# Patient Record
Sex: Female | Born: 1985 | Race: Black or African American | Hispanic: No | Marital: Married | Smoking: Former smoker
Health system: Southern US, Community
[De-identification: ages and names within clinical notes are randomized; demographics above are authoritative.]

## PROBLEM LIST (undated history)

## (undated) ENCOUNTER — Inpatient Hospital Stay (HOSPITAL_COMMUNITY): Payer: Self-pay

## (undated) DIAGNOSIS — T8859XA Other complications of anesthesia, initial encounter: Secondary | ICD-10-CM

## (undated) DIAGNOSIS — K5900 Constipation, unspecified: Secondary | ICD-10-CM

## (undated) DIAGNOSIS — E119 Type 2 diabetes mellitus without complications: Secondary | ICD-10-CM

## (undated) DIAGNOSIS — IMO0002 Reserved for concepts with insufficient information to code with codable children: Secondary | ICD-10-CM

## (undated) DIAGNOSIS — F329 Major depressive disorder, single episode, unspecified: Secondary | ICD-10-CM

## (undated) DIAGNOSIS — K279 Peptic ulcer, site unspecified, unspecified as acute or chronic, without hemorrhage or perforation: Secondary | ICD-10-CM

## (undated) DIAGNOSIS — R010 Benign and innocent cardiac murmurs: Secondary | ICD-10-CM

## (undated) DIAGNOSIS — F32A Depression, unspecified: Secondary | ICD-10-CM

## (undated) DIAGNOSIS — K219 Gastro-esophageal reflux disease without esophagitis: Secondary | ICD-10-CM

## (undated) HISTORY — PX: UPPER GASTROINTESTINAL ENDOSCOPY: SHX188

## (undated) HISTORY — DX: Gastro-esophageal reflux disease without esophagitis: K21.9

## (undated) HISTORY — DX: Major depressive disorder, single episode, unspecified: F32.9

## (undated) HISTORY — DX: Constipation, unspecified: K59.00

## (undated) HISTORY — DX: Type 2 diabetes mellitus without complications: E11.9

## (undated) HISTORY — PX: WISDOM TOOTH EXTRACTION: SHX21

## (undated) HISTORY — PX: MYRINGOTOMY: SHX2060

## (undated) HISTORY — DX: Depression, unspecified: F32.A

---
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
## 2005-03-16 ENCOUNTER — Inpatient Hospital Stay (HOSPITAL_COMMUNITY): Admission: AD | Admit: 2005-03-16 | Discharge: 2005-03-16 | Payer: Self-pay | Admitting: Obstetrics

## 2005-03-21 ENCOUNTER — Inpatient Hospital Stay (HOSPITAL_COMMUNITY): Admission: AD | Admit: 2005-03-21 | Discharge: 2005-03-22 | Payer: Self-pay | Admitting: Obstetrics & Gynecology

## 2005-04-29 ENCOUNTER — Observation Stay (HOSPITAL_COMMUNITY): Admission: AD | Admit: 2005-04-29 | Discharge: 2005-04-30 | Payer: Self-pay | Admitting: Obstetrics

## 2005-05-09 ENCOUNTER — Inpatient Hospital Stay (HOSPITAL_COMMUNITY): Admission: AD | Admit: 2005-05-09 | Discharge: 2005-05-11 | Payer: Self-pay | Admitting: Obstetrics & Gynecology

## 2005-05-27 ENCOUNTER — Inpatient Hospital Stay (HOSPITAL_COMMUNITY): Admission: AD | Admit: 2005-05-27 | Discharge: 2005-05-30 | Payer: Self-pay | Admitting: Obstetrics & Gynecology

## 2005-12-09 ENCOUNTER — Emergency Department (HOSPITAL_COMMUNITY): Admission: EM | Admit: 2005-12-09 | Discharge: 2005-12-09 | Payer: Self-pay | Admitting: Emergency Medicine

## 2006-04-11 ENCOUNTER — Emergency Department (HOSPITAL_COMMUNITY): Admission: EM | Admit: 2006-04-11 | Discharge: 2006-04-12 | Payer: Self-pay | Admitting: Emergency Medicine

## 2006-10-31 ENCOUNTER — Emergency Department (HOSPITAL_COMMUNITY): Admission: EM | Admit: 2006-10-31 | Discharge: 2006-10-31 | Payer: Self-pay | Admitting: Emergency Medicine

## 2007-01-21 ENCOUNTER — Emergency Department (HOSPITAL_COMMUNITY): Admission: EM | Admit: 2007-01-21 | Discharge: 2007-01-21 | Payer: Self-pay | Admitting: Emergency Medicine

## 2007-01-23 ENCOUNTER — Emergency Department (HOSPITAL_COMMUNITY): Admission: EM | Admit: 2007-01-23 | Discharge: 2007-01-23 | Payer: Self-pay | Admitting: Emergency Medicine

## 2008-03-29 ENCOUNTER — Emergency Department (HOSPITAL_COMMUNITY): Admission: EM | Admit: 2008-03-29 | Discharge: 2008-03-30 | Payer: Self-pay | Admitting: Emergency Medicine

## 2008-03-30 ENCOUNTER — Emergency Department (HOSPITAL_COMMUNITY): Admission: EM | Admit: 2008-03-30 | Discharge: 2008-03-30 | Payer: Self-pay | Admitting: Emergency Medicine

## 2008-04-04 ENCOUNTER — Emergency Department (HOSPITAL_COMMUNITY): Admission: EM | Admit: 2008-04-04 | Discharge: 2008-04-05 | Payer: Self-pay | Admitting: Emergency Medicine

## 2008-04-22 ENCOUNTER — Emergency Department (HOSPITAL_COMMUNITY): Admission: EM | Admit: 2008-04-22 | Discharge: 2008-04-22 | Payer: Self-pay | Admitting: Emergency Medicine

## 2008-05-30 ENCOUNTER — Emergency Department (HOSPITAL_COMMUNITY): Admission: EM | Admit: 2008-05-30 | Discharge: 2008-05-30 | Payer: Self-pay | Admitting: Emergency Medicine

## 2008-10-02 ENCOUNTER — Inpatient Hospital Stay (HOSPITAL_COMMUNITY): Admission: AD | Admit: 2008-10-02 | Discharge: 2008-10-03 | Payer: Self-pay | Admitting: Obstetrics & Gynecology

## 2010-06-11 ENCOUNTER — Ambulatory Visit: Payer: Self-pay | Admitting: Gynecology

## 2010-06-11 ENCOUNTER — Inpatient Hospital Stay (HOSPITAL_COMMUNITY): Admission: AD | Admit: 2010-06-11 | Discharge: 2010-06-11 | Payer: Self-pay | Admitting: Obstetrics & Gynecology

## 2010-07-13 ENCOUNTER — Emergency Department (HOSPITAL_COMMUNITY): Admission: EM | Admit: 2010-07-13 | Discharge: 2010-07-13 | Payer: Self-pay | Admitting: Emergency Medicine

## 2010-11-06 ENCOUNTER — Emergency Department (HOSPITAL_COMMUNITY)
Admission: EM | Admit: 2010-11-06 | Discharge: 2010-11-06 | Payer: Self-pay | Source: Home / Self Care | Admitting: Emergency Medicine

## 2011-01-22 LAB — URINALYSIS, ROUTINE W REFLEX MICROSCOPIC
Glucose, UA: NEGATIVE mg/dL
Ketones, ur: NEGATIVE mg/dL
Nitrite: NEGATIVE
Urobilinogen, UA: 0.2 mg/dL (ref 0.0–1.0)
pH: 6 (ref 5.0–8.0)

## 2011-01-22 LAB — URINE MICROSCOPIC-ADD ON

## 2011-01-22 LAB — POCT PREGNANCY, URINE: Preg Test, Ur: NEGATIVE

## 2011-03-26 NOTE — Op Note (Signed)
NAME:  Tanya Terry, Tanya Terry                        ACCOUNT NO.:  [id number]   MEDICAL RECORD NO.:  [id number]                   PATIENT TYPE:  AMB   LOCATION:  ENDO                                 FACILITY:  [facility]   PHYSICIAN:  Sharyn Dross., M.D.               DATE OF BIRTH:  Jul 27, 1986   DATE OF PROCEDURE:  12/07/2002  DATE OF DISCHARGE:                                 OPERATIVE REPORT   PREOPERATIVE DIAGNOSES:  1. Noncardiac chest discomfort.  2. History of Helicobacter pylori, not responding to past treatments.   POSTOPERATIVE DIAGNOSES:  Grossly normal endoscopic examination.   PROCEDURE:  Esophagogastroduodenoscopy with biopsies.   MEDICATIONS:  1. Demerol 75 mg IV.  2. Versed 6 mg IV over a 10 minute period of time.   INSTRUMENT USED:  Olympus video panendoscope.   ENDOSCOPIST:  Dortha Kern, M.D.   INDICATIONS:  Subjectively this 25 year old female has been known to me over  the past year. She had an endoscopy for abdominal pains and discomfort that  was present. The endo results appeared to be grossly unremarkable, but a  biopsy of the clo study was positive. She was treated with the Prev-pack  initially at that time. She was followed with a repeat endoscopy, but the  treatment was performed again and the results were subsequently that the  bacteria still existed.   The patient was  subsequently started again with a second preparation,  feeling that it was inadequate, that she had been taking the proton pump  inhibitor too recently to be very effective. She was stopped off of all of  her PPIs and she subsequently was treated again. A month later she  subsequently underwent a repeat endoscopy and the results were the same.   At that time she subsequently was changed to a different preparation using  Aciphex as the alternate method at this time. She tolerated the preparation  well without any complications and her symptoms had improved. However,  because we did not  have the final confirmation, she was brought back in to  have the area retested.   Objective findings were the patient appears to be in no acute distress. Her  vital signs were stable. Her HEENT examination is anicteric. Neck was  supple. Lungs were clear. Heart had a regular rate and rhythm without any  murmurs, rubs, gallops. The abdomen was soft, there was no tenderness no  hepatosplenomegaly noted. The extremities showed no cyanosis, clubbing or  edema.   PLAN:  I am going to proceed with the endoscopic examination and a biopsy  for the clo study today.   INFORMED CONSENT:  The patient was advised of the procedure and the  indications and the risks. She has agreed to have the procedure performed  again since she has had it done previously.   PREOPERATIVE PREPARATION:  The patient presented to [facility]  today where the IV was  started for IV conscious sedating medications. A  monitor was placed on the patient to monitor the patient's vital signs and  O2 saturation. Nasal oxygen at 2 liters a minute was used and after adequate  sedation was performed the procedure was begun.   DESCRIPTION OF PROCEDURE:  The instrument was advanced with the patient  lying in the left lateral position via the direct technique without  difficulty. The oropharyngeal, epiglottis, vocal cords and piriform sinuses  appeared to be grossly within normal limits. The esophagus is normal without  any evidence of acute inflammation, ulcerations, hiatal hernias or varices  appreciated.   The gastric area showed a normal mucous layer without any evidence of acute  inflammation or ulcerations noted. There was some small amount of debris  that was present in the gastric body that was present, however, there was no  evidence of an obstructing process that was present. The pylorus was  evaluated and appeared to be normal at this time. Upon advancing into the  pyloric and on the duodenal bulb the second  portion appeared to be also  unremarkable.   The instrument was retracted backward again. Two biopsies for the clo study  were performed. Retroflex view of the cardia revealed no gross pathology  (photographs taken but not available). The Z-line appeared to be  approximately 39 cm distal to the esophagus. The instrument was subsequently  retracted back and removed without difficulty with the patient tolerating  the procedure well.   TREATMENT:  1. Await the results of the clo study.  2. If evidence of failure at this time will change her whole preparation     system around at that point, but depending upon the issues also the time     and the course of therapy.                                               Sharyn Dross., M.D.    JM/MEDQ  D:  12/07/2002  T:  12/07/2002  Job:  [id number]

## 2011-04-22 ENCOUNTER — Inpatient Hospital Stay (HOSPITAL_COMMUNITY)
Admission: AD | Admit: 2011-04-22 | Discharge: 2011-04-22 | Disposition: A | Discharge status: Home / Self Care | Payer: Medicaid Other | Source: Ambulatory Visit | Attending: Obstetrics | Admitting: Obstetrics

## 2011-04-22 ENCOUNTER — Inpatient Hospital Stay (HOSPITAL_COMMUNITY): Payer: Medicaid Other

## 2011-04-22 DIAGNOSIS — B3731 Acute candidiasis of vulva and vagina: Secondary | ICD-10-CM | POA: Insufficient documentation

## 2011-04-22 DIAGNOSIS — O2390 Unspecified genitourinary tract infection in pregnancy, unspecified trimester: Secondary | ICD-10-CM | POA: Insufficient documentation

## 2011-04-22 DIAGNOSIS — B373 Candidiasis of vulva and vagina: Secondary | ICD-10-CM | POA: Insufficient documentation

## 2011-04-22 DIAGNOSIS — N760 Acute vaginitis: Secondary | ICD-10-CM | POA: Insufficient documentation

## 2011-04-22 DIAGNOSIS — R1032 Left lower quadrant pain: Secondary | ICD-10-CM | POA: Insufficient documentation

## 2011-04-22 DIAGNOSIS — B9689 Other specified bacterial agents as the cause of diseases classified elsewhere: Secondary | ICD-10-CM | POA: Insufficient documentation

## 2011-04-22 DIAGNOSIS — A499 Bacterial infection, unspecified: Secondary | ICD-10-CM | POA: Insufficient documentation

## 2011-04-22 LAB — URINE MICROSCOPIC-ADD ON

## 2011-04-22 LAB — URINALYSIS, ROUTINE W REFLEX MICROSCOPIC
Bilirubin Urine: NEGATIVE
Ketones, ur: NEGATIVE mg/dL
Nitrite: NEGATIVE

## 2011-04-22 LAB — CBC
MCH: 31.9 pg (ref 26.0–34.0)
MCHC: 34.8 g/dL (ref 30.0–36.0)
MCV: 91.6 fL (ref 78.0–100.0)
RBC: 4.42 MIL/uL (ref 3.87–5.11)
RDW: 12 % (ref 11.5–15.5)
WBC: 13.3 10*3/uL — ABNORMAL HIGH (ref 4.0–10.5)

## 2011-04-22 LAB — WET PREP, GENITAL: Trich, Wet Prep: NONE SEEN

## 2011-04-23 LAB — GC/CHLAMYDIA PROBE AMP, GENITAL: GC Probe Amp, Genital: NEGATIVE

## 2011-04-24 LAB — URINE CULTURE: Colony Count: 40000

## 2011-04-30 ENCOUNTER — Inpatient Hospital Stay (HOSPITAL_COMMUNITY)
Admission: AD | Admit: 2011-04-30 | Discharge: 2011-04-30 | Disposition: A | Discharge status: Home / Self Care | Payer: Medicaid Other | Source: Ambulatory Visit | Attending: Obstetrics & Gynecology | Admitting: Obstetrics & Gynecology

## 2011-04-30 ENCOUNTER — Inpatient Hospital Stay (HOSPITAL_COMMUNITY): Payer: Medicaid Other

## 2011-04-30 DIAGNOSIS — O209 Hemorrhage in early pregnancy, unspecified: Secondary | ICD-10-CM | POA: Insufficient documentation

## 2011-04-30 LAB — URINALYSIS, ROUTINE W REFLEX MICROSCOPIC
Ketones, ur: NEGATIVE mg/dL
Nitrite: NEGATIVE
Protein, ur: NEGATIVE mg/dL
Urobilinogen, UA: 1 mg/dL (ref 0.0–1.0)
pH: 7 (ref 5.0–8.0)

## 2011-04-30 LAB — WET PREP, GENITAL
Clue Cells Wet Prep HPF POC: NONE SEEN
Trich, Wet Prep: NONE SEEN
Yeast Wet Prep HPF POC: NONE SEEN

## 2011-04-30 LAB — URINE MICROSCOPIC-ADD ON

## 2011-05-18 LAB — RPR: RPR: NONREACTIVE

## 2011-05-18 LAB — ANTIBODY SCREEN: Antibody Screen: NEGATIVE

## 2011-06-02 ENCOUNTER — Inpatient Hospital Stay (HOSPITAL_COMMUNITY)
Admission: AD | Admit: 2011-06-02 | Discharge: 2011-06-02 | Disposition: A | Discharge status: Home / Self Care | Payer: Medicaid Other | Source: Ambulatory Visit | Attending: Obstetrics & Gynecology | Admitting: Obstetrics & Gynecology

## 2011-06-02 ENCOUNTER — Encounter (HOSPITAL_COMMUNITY): Payer: Self-pay

## 2011-06-02 DIAGNOSIS — O9989 Other specified diseases and conditions complicating pregnancy, childbirth and the puerperium: Secondary | ICD-10-CM | POA: Insufficient documentation

## 2011-06-02 DIAGNOSIS — R1031 Right lower quadrant pain: Secondary | ICD-10-CM | POA: Insufficient documentation

## 2011-06-02 DIAGNOSIS — N949 Unspecified condition associated with female genital organs and menstrual cycle: Secondary | ICD-10-CM | POA: Insufficient documentation

## 2011-06-02 LAB — URINALYSIS, ROUTINE W REFLEX MICROSCOPIC
Ketones, ur: NEGATIVE mg/dL
Nitrite: NEGATIVE
Protein, ur: NEGATIVE mg/dL
Urobilinogen, UA: 0.2 mg/dL (ref 0.0–1.0)

## 2011-06-02 LAB — URINE MICROSCOPIC-ADD ON

## 2011-06-02 NOTE — ED Provider Notes (Signed)
History   Pt is a G2P1001 at 11wks who presents for eval of intermittent right-sided low abd pain that began last night. Denies bldg, leak, or dysuria. Has next OB appt 06/12/11.  Chief Complaint  Patient presents with  . Abdominal Pain   HPI See hx OB History    Grav Para Term Preterm Abortions TAB SAB Ect Mult Living   1 0 0 0 0 0 0 0 0 1       Past Medical History  Diagnosis Date  . No pertinent past medical history     Past Surgical History  Procedure Date  . Upper gastrointestinal endoscopy     Family History  Problem Relation Age of Onset  . Diabetes Mother   . Hypertension Mother   . Heart disease Father     History  Substance Use Topics  . Smoking status: Former Games developer  . Smokeless tobacco: Not on file  . Alcohol Use:     Allergies:  Allergies  Allergen Reactions  . Augmentin (Amoxicillin-Pot Clavulanate) Swelling  . Sulfa Antibiotics Swelling    Prescriptions prior to admission  Medication Sig Dispense Refill  . acetaminophen (TYLENOL) 325 MG tablet Take 325 mg by mouth every 6 (six) hours as needed. As needed for headache       . docusate sodium (COLACE) 100 MG capsule Take 100 mg by mouth daily.        . ondansetron (ZOFRAN-ODT) 8 MG disintegrating tablet Take 8 mg by mouth every 4 (four) hours as needed. As needed for nausea and vomiting       . prenatal vitamin w/FE, FA (PRENATAL 1 + 1) 27-1 MG TABS Take 1 tablet by mouth daily.        . promethazine (PHENERGAN) 25 MG tablet Take 25 mg by mouth every 6 (six) hours as needed. As needed for nausea and vomiting       . ranitidine (ZANTAC) 150 MG tablet Take 150 mg by mouth 2 (two) times daily. As needed for acid reflux         ROS Physical Exam  WNL; obese abd with uterus approx 11wks, FHT 158, neg CVAT Blood pressure 126/83, pulse 101, temperature 99 F (37.2 C), temperature source Oral, resp. rate 16, height 5' 6.5" (1.689 m), weight 105.053 kg (231 lb 9.6 oz), SpO2 100.00%.  Physical Exam  MAU  Course  Procedures  MDM   Assessment and Plan  11wk IUP Round lig pain D/C home with comfort tips given. To f/u as sched for next OB appt.  SHAW,KIMBERLY B 06/02/2011, 9:20 PM P

## 2011-06-02 NOTE — Progress Notes (Signed)
Pt states she started having sharp pain on the L side last night. Worse with movement.

## 2011-06-02 NOTE — Progress Notes (Signed)
Patient states she has been having pains in the bottom of her stomach that are shooting up her sides. Pt states pain kept her from sleeping last night

## 2011-06-07 LAB — MRSA CULTURE

## 2011-08-04 LAB — URINALYSIS, ROUTINE W REFLEX MICROSCOPIC
Bilirubin Urine: NEGATIVE
Bilirubin Urine: NEGATIVE
Glucose, UA: NEGATIVE
Glucose, UA: NEGATIVE
Ketones, ur: 15 — AB
Ketones, ur: NEGATIVE
Nitrite: NEGATIVE
Protein, ur: 100 — AB
Protein, ur: 30 — AB
pH: 6.5
pH: 7

## 2011-08-04 LAB — POCT PREGNANCY, URINE
Operator id: 173591
Operator id: 27065
Preg Test, Ur: NEGATIVE

## 2011-08-04 LAB — URINE CULTURE: Colony Count: 50000

## 2011-08-04 LAB — URINE MICROSCOPIC-ADD ON

## 2011-08-05 LAB — POCT I-STAT, CHEM 8
BUN: 9
Calcium, Ion: 1.21
Creatinine, Ser: 0.9
Glucose, Bld: 89
TCO2: 30

## 2011-08-05 LAB — RAPID STREP SCREEN (MED CTR MEBANE ONLY): Streptococcus, Group A Screen (Direct): NEGATIVE

## 2011-08-05 LAB — CBC
MCHC: 35.9
Platelets: 264
RDW: 12.3

## 2011-08-05 LAB — DIFFERENTIAL
Eosinophils Relative: 0
Lymphocytes Relative: 15
Lymphs Abs: 1.7
Monocytes Absolute: 1.5 — ABNORMAL HIGH

## 2011-08-10 LAB — CBC
Hemoglobin: 14.5
MCHC: 34.8
MCV: 93
RBC: 4.47

## 2011-08-10 LAB — WET PREP, GENITAL: Trich, Wet Prep: NONE SEEN

## 2011-08-10 LAB — DIFFERENTIAL
Basophils Relative: 1
Eosinophils Absolute: 0.1
Monocytes Absolute: 1
Monocytes Relative: 6
Neutro Abs: 12.1 — ABNORMAL HIGH

## 2011-08-10 LAB — URINALYSIS, ROUTINE W REFLEX MICROSCOPIC
Glucose, UA: NEGATIVE
Hgb urine dipstick: NEGATIVE
Ketones, ur: NEGATIVE
Protein, ur: NEGATIVE

## 2011-08-10 LAB — GC/CHLAMYDIA PROBE AMP, GENITAL: Chlamydia, DNA Probe: NEGATIVE

## 2011-09-13 LAB — HIV ANTIBODY (ROUTINE TESTING W REFLEX): HIV: NONREACTIVE

## 2011-09-13 LAB — RPR: RPR: NONREACTIVE

## 2011-10-17 ENCOUNTER — Encounter (HOSPITAL_COMMUNITY): Payer: Self-pay | Admitting: *Deleted

## 2011-10-17 ENCOUNTER — Inpatient Hospital Stay (HOSPITAL_COMMUNITY)
Admission: AD | Admit: 2011-10-17 | Discharge: 2011-10-17 | Disposition: A | Discharge status: Home / Self Care | Payer: Medicaid Other | Source: Ambulatory Visit | Attending: Obstetrics | Admitting: Obstetrics

## 2011-10-17 DIAGNOSIS — J069 Acute upper respiratory infection, unspecified: Secondary | ICD-10-CM

## 2011-10-17 DIAGNOSIS — O99891 Other specified diseases and conditions complicating pregnancy: Secondary | ICD-10-CM | POA: Insufficient documentation

## 2011-10-17 DIAGNOSIS — Z331 Pregnant state, incidental: Secondary | ICD-10-CM

## 2011-10-17 DIAGNOSIS — B9789 Other viral agents as the cause of diseases classified elsewhere: Secondary | ICD-10-CM | POA: Insufficient documentation

## 2011-10-17 HISTORY — DX: Reserved for concepts with insufficient information to code with codable children: IMO0002

## 2011-10-17 HISTORY — DX: Benign and innocent cardiac murmurs: R01.0

## 2011-10-17 NOTE — Progress Notes (Signed)
C/o inability to catch breath unless sitting straight up, c/o cold symptoms since yesterday, nasal congestion noted.

## 2011-11-09 NOTE — L&D Delivery Note (Signed)
Delivery Note At 11:24 PM a viable female was delivered via Vaginal, Spontaneous Delivery (Presentation: Middle Occiput Anterior).  APGAR: 8, 9; weight 8 lb 3.8 oz (3735 g).   Placenta status: Intact, Spontaneous.  Cord: 3 vessels with the following complications: None.  Cord pH: none.  Anesthesia: Local  Episiotomy: None Lacerations: 2nd degree;Perineal Suture Repair: 2.0 chromic Est. Blood Loss (mL):   Mom to postpartum.  Baby to nursery-stable.  Koa Zoeller A 12/16/2011, 12:08 AM

## 2011-11-17 ENCOUNTER — Encounter (HOSPITAL_COMMUNITY): Payer: Self-pay

## 2011-11-17 ENCOUNTER — Inpatient Hospital Stay (HOSPITAL_COMMUNITY)
Admission: AD | Admit: 2011-11-17 | Discharge: 2011-11-17 | Disposition: A | Discharge status: Home / Self Care | Payer: Medicaid Other | Source: Ambulatory Visit | Attending: Obstetrics | Admitting: Obstetrics

## 2011-11-17 DIAGNOSIS — O479 False labor, unspecified: Secondary | ICD-10-CM

## 2011-11-17 DIAGNOSIS — R109 Unspecified abdominal pain: Secondary | ICD-10-CM | POA: Insufficient documentation

## 2011-11-17 DIAGNOSIS — O4700 False labor before 37 completed weeks of gestation, unspecified trimester: Secondary | ICD-10-CM | POA: Insufficient documentation

## 2011-11-17 LAB — URINALYSIS, ROUTINE W REFLEX MICROSCOPIC
Bilirubin Urine: NEGATIVE
Ketones, ur: NEGATIVE mg/dL
Nitrite: NEGATIVE
Protein, ur: NEGATIVE mg/dL
Specific Gravity, Urine: >1.03 — ABNORMAL HIGH (ref 1.005–1.030)
Urobilinogen, UA: 0.2 mg/dL (ref 0.0–1.0)

## 2011-11-17 LAB — URINE CULTURE
Colony Count: >=100000
Culture  Setup Time: 201301100242

## 2011-11-17 LAB — URINE MICROSCOPIC-ADD ON

## 2011-11-17 MED ORDER — RANITIDINE HCL 150 MG PO CAPS: 150.0000 mg | ORAL_CAPSULE | Freq: Two times a day (BID) | ORAL | Status: DC

## 2011-11-17 MED ORDER — PANTOPRAZOLE SODIUM 40 MG PO TBEC: 40.0000 mg | DELAYED_RELEASE_TABLET | Freq: Every day | ORAL | Status: DC

## 2011-11-17 MED ORDER — PROMETHAZINE HCL 12.5 MG PO TABS: 12.5000 mg | ORAL_TABLET | Freq: Four times a day (QID) | ORAL | Status: DC | PRN

## 2011-11-17 NOTE — ED Provider Notes (Signed)
History    G2P1 at [redacted]w[redacted]d presents with cramping every 2 min for 2-3 hours.  Denies LOF, vaginal bleeding, abnormal vaginal discharge.  Reports active fetal movement.    Chief Complaint  Patient presents with  . Abdominal Pain   HPI  OB History    Grav Para Term Preterm Abortions TAB SAB Ect Mult Living   2 1 1  0 0 0 0 0 0 1      Past Medical History  Diagnosis Date  . Benign heart murmur   . Ulcer     Past Surgical History  Procedure Date  . Upper gastrointestinal endoscopy   . Wisdom tooth extraction     Family History  Problem Relation Age of Onset  . Diabetes Mother   . Hypertension Mother   . Heart disease Father   . Anesthesia problems Neg Hx   . Hypotension Neg Hx   . Malignant hyperthermia Neg Hx   . Pseudochol deficiency Neg Hx     History  Substance Use Topics  . Smoking status: Former Games developer  . Smokeless tobacco: Never Used  . Alcohol Use: No    Allergies:  Allergies  Allergen Reactions  . Augmentin (Amoxicillin-Pot Clavulanate) Shortness Of Breath and Swelling  . Sulfa Antibiotics Shortness Of Breath and Swelling    Prescriptions prior to admission  Medication Sig Dispense Refill  . acetaminophen (TYLENOL) 325 MG tablet Take 325 mg by mouth every 6 (six) hours as needed. As needed for headache       . pantoprazole (PROTONIX) 40 MG tablet Take 40 mg by mouth daily.        . Prenatal Vit-Fe Fumarate-FA (PRENATAL MULTIVITAMIN) TABS Take 1 tablet by mouth daily.        Review of Systems  Constitutional: Negative.   HENT: Negative.   Eyes: Negative.   Respiratory: Negative.   Cardiovascular: Negative.   Gastrointestinal: Negative.   Genitourinary: Negative.   Musculoskeletal: Negative.   Skin: Negative.   Neurological: Negative.   Endo/Heme/Allergies: Negative.   Psychiatric/Behavioral: Negative.    Physical Exam   Blood pressure 124/70, pulse 120, temperature 98.7 F (37.1 C), temperature source Oral, height 5' 6.5" (1.689 m), weight  132.269 kg (291 lb 9.6 oz), SpO2 98.00%.  Physical Exam  Constitutional: She is oriented to person, place, and time. She appears well-developed and well-nourished.  Neck: Normal range of motion.  Respiratory: Effort normal.  GI: Soft.  Genitourinary:       Cervix 1.5/30/-3   Musculoskeletal: Normal range of motion.  Neurological: She is alert and oriented to person, place, and time.  Skin: Skin is warm and dry.  Psychiatric: She has a normal mood and affect. Her behavior is normal. Judgment and thought content normal.   U/A results:  SG 1.030 Leukocytes few Nitrite neg  Cervix upon recheck 1.5/30/-3  MAU Course  Procedures   Assessment and Plan  Preterm contractions  Plan: Discussed POC with Dr Clearance Coots Send urine for culture Discharge home with PTL precautions    LEFTWICH-KIRBY, LISA 11/17/2011, 3:08 PM

## 2011-11-17 NOTE — Progress Notes (Signed)
Patient states she has been having lower abdominal and low back pressure for about one hour. Denies any leaking or bleeding and reports good fetal movement.

## 2011-12-08 ENCOUNTER — Encounter (HOSPITAL_COMMUNITY): Payer: Self-pay | Admitting: *Deleted

## 2011-12-08 ENCOUNTER — Inpatient Hospital Stay (HOSPITAL_COMMUNITY)
Admission: AD | Admit: 2011-12-08 | Discharge: 2011-12-09 | Disposition: A | Discharge status: Home / Self Care | Payer: Medicaid Other | Source: Ambulatory Visit | Attending: Obstetrics & Gynecology | Admitting: Obstetrics & Gynecology

## 2011-12-08 DIAGNOSIS — O479 False labor, unspecified: Secondary | ICD-10-CM | POA: Insufficient documentation

## 2011-12-08 NOTE — Progress Notes (Signed)
Pt states she started  Having pain about  1700. Pt states she had her membranes stripped today and she lost her mucous plug.

## 2011-12-11 ENCOUNTER — Inpatient Hospital Stay (HOSPITAL_COMMUNITY)
Admission: AD | Admit: 2011-12-11 | Discharge: 2011-12-11 | Disposition: A | Discharge status: Home / Self Care | Payer: Medicaid Other | Source: Ambulatory Visit | Attending: Obstetrics & Gynecology | Admitting: Obstetrics & Gynecology

## 2011-12-11 ENCOUNTER — Encounter (HOSPITAL_COMMUNITY): Payer: Self-pay

## 2011-12-11 DIAGNOSIS — O479 False labor, unspecified: Secondary | ICD-10-CM | POA: Insufficient documentation

## 2011-12-11 DIAGNOSIS — O368190 Decreased fetal movements, unspecified trimester, not applicable or unspecified: Secondary | ICD-10-CM

## 2011-12-11 LAB — STREP B DNA PROBE: GBS: NEGATIVE

## 2011-12-11 MED ORDER — OXYCODONE-ACETAMINOPHEN 5-500 MG PO CAPS: 1.0000 | ORAL_CAPSULE | ORAL | Status: DC | PRN

## 2011-12-11 NOTE — Progress Notes (Signed)
Pt reports ctx started 30 min ago . Denies SROM or bleeding at this time. Reports good fetal movement.

## 2011-12-11 NOTE — Progress Notes (Signed)
Called Dr Gaynell Face c update.  Order rec'd to D/C pt home c Labor Precautions.  Mid-level provider to write Rx for Tylox

## 2011-12-11 NOTE — Progress Notes (Signed)
Crying c  U/Cs.  States she's had pressure/discomfort since yesterday evening, but  Actual "pain" started about 1530

## 2011-12-15 ENCOUNTER — Inpatient Hospital Stay (HOSPITAL_COMMUNITY)
Admission: AD | Admit: 2011-12-15 | Discharge: 2011-12-15 | Disposition: A | Discharge status: Home / Self Care | Payer: Medicaid Other | Source: Ambulatory Visit | Attending: Obstetrics | Admitting: Obstetrics

## 2011-12-15 ENCOUNTER — Ambulatory Visit (HOSPITAL_COMMUNITY)
Admission: RE | Admit: 2011-12-15 | Discharge: 2011-12-15 | Disposition: A | Discharge status: Home / Self Care | Payer: Medicaid Other | Source: Ambulatory Visit | Attending: Obstetrics & Gynecology | Admitting: Obstetrics & Gynecology

## 2011-12-15 ENCOUNTER — Other Ambulatory Visit: Payer: Self-pay | Admitting: Obstetrics & Gynecology

## 2011-12-15 ENCOUNTER — Telehealth (HOSPITAL_COMMUNITY): Payer: Self-pay | Admitting: *Deleted

## 2011-12-15 ENCOUNTER — Encounter (HOSPITAL_COMMUNITY): Payer: Self-pay | Admitting: Family Medicine

## 2011-12-15 ENCOUNTER — Inpatient Hospital Stay (HOSPITAL_COMMUNITY)
Admission: AD | Admit: 2011-12-15 | Discharge: 2011-12-17 | DRG: 775 | Disposition: A | Discharge status: Home / Self Care | Payer: Medicaid Other | Source: Ambulatory Visit | Attending: Obstetrics | Admitting: Obstetrics

## 2011-12-15 ENCOUNTER — Encounter (HOSPITAL_COMMUNITY): Payer: Self-pay | Admitting: *Deleted

## 2011-12-15 DIAGNOSIS — O368190 Decreased fetal movements, unspecified trimester, not applicable or unspecified: Secondary | ICD-10-CM | POA: Insufficient documentation

## 2011-12-15 DIAGNOSIS — O99330 Smoking (tobacco) complicating pregnancy, unspecified trimester: Secondary | ICD-10-CM | POA: Insufficient documentation

## 2011-12-15 LAB — CBC
Platelets: 248 10*3/uL (ref 150–400)
RDW: 13.6 % (ref 11.5–15.5)
WBC: 17.8 10*3/uL — ABNORMAL HIGH (ref 4.0–10.5)

## 2011-12-15 LAB — POCT FERN TEST: Fern Test: POSITIVE

## 2011-12-15 MED ORDER — LIDOCAINE HCL (PF) 1 % IJ SOLN
30.0000 mL | INTRAMUSCULAR | Status: DC | PRN
  Administered 2011-12-15: 30 mL via SUBCUTANEOUS
  Filled 2011-12-15: qty 30

## 2011-12-15 MED ORDER — PHENYLEPHRINE 40 MCG/ML (10ML) SYRINGE FOR IV PUSH (FOR BLOOD PRESSURE SUPPORT): 80.0000 ug | PREFILLED_SYRINGE | INTRAVENOUS | Status: DC | PRN

## 2011-12-15 MED ORDER — IBUPROFEN 600 MG PO TABS: 600.0000 mg | ORAL_TABLET | Freq: Four times a day (QID) | ORAL | Status: DC | PRN

## 2011-12-15 MED ORDER — FLEET ENEMA 7-19 GM/118ML RE ENEM: 1.0000 | ENEMA | RECTAL | Status: DC | PRN

## 2011-12-15 MED ORDER — CITRIC ACID-SODIUM CITRATE 334-500 MG/5ML PO SOLN: 30.0000 mL | ORAL | Status: DC | PRN

## 2011-12-15 MED ORDER — OXYTOCIN BOLUS FROM INFUSION
500.0000 mL | Freq: Once | INTRAVENOUS | Status: DC
  Filled 2011-12-15: qty 1000
  Filled 2011-12-15: qty 500

## 2011-12-15 MED ORDER — ACETAMINOPHEN 325 MG PO TABS: 650.0000 mg | ORAL_TABLET | ORAL | Status: DC | PRN

## 2011-12-15 MED ORDER — LACTATED RINGERS IV SOLN: 500.0000 mL | Freq: Once | INTRAVENOUS | Status: DC

## 2011-12-15 MED ORDER — DIPHENHYDRAMINE HCL 50 MG/ML IJ SOLN: 12.5000 mg | INTRAMUSCULAR | Status: DC | PRN

## 2011-12-15 MED ORDER — ONDANSETRON HCL 4 MG/2ML IJ SOLN: 4.0000 mg | Freq: Four times a day (QID) | INTRAMUSCULAR | Status: DC | PRN

## 2011-12-15 MED ORDER — OXYTOCIN 20 UNITS IN LACTATED RINGERS INFUSION - SIMPLE
125.0000 mL/h | Freq: Once | INTRAVENOUS | Status: AC
  Administered 2011-12-15: 999 mL/h via INTRAVENOUS

## 2011-12-15 MED ORDER — OXYCODONE-ACETAMINOPHEN 5-325 MG PO TABS
1.0000 | ORAL_TABLET | ORAL | Status: DC | PRN
  Administered 2011-12-16: 2 via ORAL
  Filled 2011-12-15: qty 2

## 2011-12-15 MED ORDER — LACTATED RINGERS IV SOLN: INTRAVENOUS | Status: DC

## 2011-12-15 MED ORDER — LACTATED RINGERS IV SOLN
500.0000 mL | INTRAVENOUS | Status: DC | PRN
  Administered 2011-12-15: 500 mL via INTRAVENOUS

## 2011-12-15 MED ORDER — EPHEDRINE 5 MG/ML INJ: 10.0000 mg | INTRAVENOUS | Status: DC | PRN

## 2011-12-15 MED ORDER — FENTANYL 2.5 MCG/ML BUPIVACAINE 1/10 % EPIDURAL INFUSION (WH - ANES): 14.0000 mL/h | INTRAMUSCULAR | Status: DC

## 2011-12-15 NOTE — Progress Notes (Signed)
Pt sent over from ultrasound [place] 6/8

## 2011-12-15 NOTE — ED Provider Notes (Signed)
History     Chief Complaint  Patient presents with  . Routine Prenatal Visit    bpp   HPI Tanya Terry 26 y.o. 39w 6d.  Came to MAU from ultrasound with BPP 6/8 and high normal fluid.  Having decreased movement today and was sent from the office with a nonreactive NST for a BPP.  OB History    Grav Para Term Preterm Abortions TAB SAB Ect Mult Living   2 1 1  0 0 0 0 0 0 1      Past Medical History  Diagnosis Date  . Benign heart murmur   . Ulcer     stomach (no longer taking meds)    Past Surgical History  Procedure Date  . Upper gastrointestinal endoscopy   . Wisdom tooth extraction   . Myringotomy     with tubes    Family History  Problem Relation Age of Onset  . Diabetes Mother   . Hypertension Mother   . Heart disease Father   . Anesthesia problems Neg Hx   . Hypotension Neg Hx   . Malignant hyperthermia Neg Hx   . Pseudochol deficiency Neg Hx     History  Substance Use Topics  . Smoking status: Former Games developer  . Smokeless tobacco: Never Used  . Alcohol Use: No    Allergies:  Allergies  Allergen Reactions  . Augmentin (Amoxicillin-Pot Clavulanate) Shortness Of Breath and Swelling  . Sulfa Antibiotics Shortness Of Breath and Swelling    Prescriptions prior to admission  Medication Sig Dispense Refill  . Prenatal Vit-Fe Fumarate-FA (PRENATAL MULTIVITAMIN) TABS Take 1 tablet by mouth daily.        Review of Systems  Genitourinary:       Decreased fetal movement   Physical Exam   There were no vitals taken for this visit.  Physical Exam  Nursing note and vitals reviewed. Constitutional: She is oriented to person, place, and time. She appears well-developed and well-nourished.  HENT:  Head: Normocephalic.  Eyes: EOM are normal.  Neck: Neck supple.  GI:       No pain ocassional contractions FHT baseline 140 - reactive strip with 15x15 accels.  Musculoskeletal: Normal range of motion.  Neurological: She is alert and oriented to person,  place, and time.  Skin: Skin is warm and dry.  Psychiatric: She has a normal mood and affect.    MAU Course  Procedures  MDM Consult with Dr. Clearance Coots re: plan of care  Assessment and Plan  [redacted] week gestation with decreased fetal movement earlier today Currently has BPP of 6/8 and reactive NST.  Plan Discharge Instructions for kick counts given Keep your appointments in the office Call your doctor if you have questions or concerns.  BURLESON,TERRI 12/15/2011, 6:37 PM   Nolene Bernheim, NP 12/15/11 1913

## 2011-12-15 NOTE — Progress Notes (Signed)
Dr. Clearance Coots notified of pt presenting for labor check with SROM.  notified of GBS negative.  Admit orders received.

## 2011-12-16 ENCOUNTER — Encounter (HOSPITAL_COMMUNITY): Payer: Self-pay | Admitting: Family Medicine

## 2011-12-16 LAB — CBC
MCH: 30.1 pg (ref 26.0–34.0)
MCHC: 33.3 g/dL (ref 30.0–36.0)
MCV: 90.4 fL (ref 78.0–100.0)
Platelets: 225 10*3/uL (ref 150–400)
RBC: 3.85 MIL/uL — ABNORMAL LOW (ref 3.87–5.11)

## 2011-12-16 MED ORDER — AMPICILLIN NICU INJECTION 500 MG: 100.0000 mg/kg | Freq: Two times a day (BID) | INTRAMUSCULAR | Status: DC

## 2011-12-16 MED ORDER — TETANUS-DIPHTH-ACELL PERTUSSIS 5-2.5-18.5 LF-MCG/0.5 IM SUSP: 0.5000 mL | Freq: Once | INTRAMUSCULAR | Status: DC

## 2011-12-16 MED ORDER — DIBUCAINE 1 % RE OINT: 1.0000 "application " | TOPICAL_OINTMENT | RECTAL | Status: DC | PRN

## 2011-12-16 MED ORDER — METHYLERGONOVINE MALEATE 0.2 MG/ML IJ SOLN: 0.2000 mg | INTRAMUSCULAR | Status: DC | PRN

## 2011-12-16 MED ORDER — MEDROXYPROGESTERONE ACETATE 150 MG/ML IM SUSP: 150.0000 mg | INTRAMUSCULAR | Status: DC | PRN

## 2011-12-16 MED ORDER — OXYCODONE-ACETAMINOPHEN 5-325 MG PO TABS
1.0000 | ORAL_TABLET | ORAL | Status: DC | PRN
  Administered 2011-12-16: 1 via ORAL
  Administered 2011-12-16 (×2): 2 via ORAL
  Administered 2011-12-17 (×2): 1 via ORAL
  Filled 2011-12-16: qty 2
  Filled 2011-12-16: qty 1
  Filled 2011-12-16: qty 2
  Filled 2011-12-16 (×2): qty 1

## 2011-12-16 MED ORDER — ZOLPIDEM TARTRATE 5 MG PO TABS: 5.0000 mg | ORAL_TABLET | Freq: Every evening | ORAL | Status: DC | PRN

## 2011-12-16 MED ORDER — WITCH HAZEL-GLYCERIN EX PADS: 1.0000 "application " | MEDICATED_PAD | CUTANEOUS | Status: DC | PRN

## 2011-12-16 MED ORDER — ONDANSETRON HCL 4 MG PO TABS: 4.0000 mg | ORAL_TABLET | ORAL | Status: DC | PRN

## 2011-12-16 MED ORDER — GENTAMICIN NICU IV SYRINGE 10 MG/ML: 5.0000 mg/kg | Freq: Once | INTRAMUSCULAR | Status: DC

## 2011-12-16 MED ORDER — METHYLERGONOVINE MALEATE 0.2 MG PO TABS: 0.2000 mg | ORAL_TABLET | ORAL | Status: DC | PRN

## 2011-12-16 MED ORDER — IBUPROFEN 600 MG PO TABS
600.0000 mg | ORAL_TABLET | Freq: Four times a day (QID) | ORAL | Status: DC
  Administered 2011-12-16 – 2011-12-17 (×6): 600 mg via ORAL
  Filled 2011-12-16 (×5): qty 1

## 2011-12-16 MED ORDER — DIPHENHYDRAMINE HCL 25 MG PO CAPS: 25.0000 mg | ORAL_CAPSULE | Freq: Four times a day (QID) | ORAL | Status: DC | PRN

## 2011-12-16 MED ORDER — SIMETHICONE 80 MG PO CHEW: 80.0000 mg | CHEWABLE_TABLET | ORAL | Status: DC | PRN

## 2011-12-16 MED ORDER — ONDANSETRON HCL 4 MG/2ML IJ SOLN: 4.0000 mg | INTRAMUSCULAR | Status: DC | PRN

## 2011-12-16 MED ORDER — PRENATAL MULTIVITAMIN CH
1.0000 | ORAL_TABLET | Freq: Every day | ORAL | Status: DC
  Administered 2011-12-16 – 2011-12-17 (×2): 1 via ORAL
  Filled 2011-12-16 (×2): qty 1

## 2011-12-16 MED ORDER — BENZOCAINE-MENTHOL 20-0.5 % EX AERO
INHALATION_SPRAY | CUTANEOUS | Status: AC
  Administered 2011-12-16: 04:00:00
  Filled 2011-12-16: qty 56

## 2011-12-16 MED ORDER — SENNOSIDES-DOCUSATE SODIUM 8.6-50 MG PO TABS
2.0000 | ORAL_TABLET | Freq: Every day | ORAL | Status: DC
  Administered 2011-12-16: 2 via ORAL

## 2011-12-16 MED ORDER — BENZOCAINE-MENTHOL 20-0.5 % EX AERO
1.0000 "application " | INHALATION_SPRAY | CUTANEOUS | Status: DC | PRN
  Administered 2011-12-16: 1 via TOPICAL

## 2011-12-16 MED ORDER — OXYTOCIN 20 UNITS IN LACTATED RINGERS INFUSION - SIMPLE: 125.0000 mL/h | INTRAVENOUS | Status: DC | PRN

## 2011-12-16 MED ORDER — LANOLIN HYDROUS EX OINT: TOPICAL_OINTMENT | CUTANEOUS | Status: DC | PRN

## 2011-12-16 NOTE — Progress Notes (Signed)
Spoke with Tanya Terry [facility] who states ok to discontinue contact precautions due to neg MRSA PCR from 12/15/11.  Patient complained regarding isolation equipment posted at pt door.  Erdy notified and states ok to discontinue due to neg result.Tanya Terry

## 2011-12-16 NOTE — Progress Notes (Addendum)
Unknown complete dilatation time. Last exam at 2308 pt 5-6 cm/80/-2. At 2320 on the telephone with MD for pain med orders.

## 2011-12-16 NOTE — H&P (Signed)
Tanya Terry is a 26 y.o. female presenting for SROM and UC's.. Maternal Medical History:  Reason for admission: Reason for admission: rupture of membranes and contractions.  Contractions: Onset was 3-5 hours ago.   Frequency: regular.   Duration is approximately 1 minute.    Fetal activity: Perceived fetal activity is decreased.      OB History    Grav Para Term Preterm Abortions TAB SAB Ect Mult Living   2 1 1  0 0 0 0 0 0 1     Past Medical History  Diagnosis Date  . Benign heart murmur   . Ulcer     stomach (no longer taking meds)   Past Surgical History  Procedure Date  . Upper gastrointestinal endoscopy   . Wisdom tooth extraction   . Myringotomy     with tubes   Family History: family history includes Diabetes in her mother; Heart disease in her father; and Hypertension in her mother.  There is no history of Anesthesia problems, and Hypotension, and Malignant hyperthermia, and Pseudochol deficiency, . Social History:  reports that she has quit smoking. She has never used smokeless tobacco. She reports that she does not drink alcohol or use illicit drugs.  ROS    Blood pressure 149/82, pulse 103, temperature 97.7 F (36.5 C), temperature source Oral, resp. rate 22. Exam Physical Exam  Constitutional: She appears well-developed and well-nourished.  Cardiovascular: Normal rate.   Respiratory: Effort normal.  GI: Soft.    Prenatal labs: ABO, Rh: --/--/O POS (06/14 1645) Antibody:   Rubella:   RPR:    HBsAg:    HIV:    GBS: Negative (02/02 0000)   Assessment/Plan: 39 weeks SROM. Active labor.   Lenia Housley A 12/16/2011, 12:04 AM

## 2011-12-16 NOTE — Progress Notes (Addendum)
MD arrives at bedside. Support person then explains to MD when RN stepped out to call support person told pt to push with no one in the room. This conversation witnessed by myself, MD, and additional staff.

## 2011-12-16 NOTE — Progress Notes (Addendum)
NSVD of a viable female. FP at bedside seconds after delivery.

## 2011-12-16 NOTE — Progress Notes (Signed)
Post Partum Day 1 Subjective: no complaints  Objective: Blood pressure 102/62, pulse 81, temperature 97.7 F (36.5 C), temperature source Oral, resp. rate 20, SpO2 98.00%, unknown if currently breastfeeding.  Physical Exam:  General: alert and no distress Lochia: appropriate Uterine Fundus: firm Incision: healing well DVT Evaluation: No evidence of DVT seen on physical exam.   Basename 12/16/11 0540 12/15/11 2315  HGB 11.6* 13.4  HCT 34.8* 39.6    Assessment/Plan: Plan for discharge tomorrow   LOS: 1 day   HARPER,CHARLES A 12/16/2011, 8:53 AM

## 2011-12-16 NOTE — Progress Notes (Addendum)
Dr. Clearance Coots notified of pt status, FHR, UC pattern and pts pain level. Request for pain medication. Dr. Clearance Coots asked to come for delivery.

## 2011-12-16 NOTE — Progress Notes (Signed)
Tanya Terry is a 26 y.o. G2P2002 at [redacted]w[redacted]d by LMP admitted for active labor, rupture of membranes  Subjective:   Objective: BP 125/75  Pulse 101  Temp(Src) 97.7 F (36.5 C) (Oral)  Resp 20  Breastfeeding? Unknown   Total I/O In: -  Out: 300 [Blood:300]  FHT:  FHR: 150 bpm, variability: moderate,  accelerations:  Present,  decelerations:  Absent UC:   regular, every 3 minutes SVE:      Labs: Lab Results  Component Value Date   WBC 17.8* 12/15/2011   HGB 13.4 12/15/2011   HCT 39.6 12/15/2011   MCV 89.0 12/15/2011   PLT 248 12/15/2011    Assessment / Plan: Spontaneous labor, progressing normally  Labor: Progressing normally Preeclampsia:  n/a Fetal Wellbeing:  Category I Pain Control:  Labor support without medications I/D:  n/a Anticipated MOD:  NSVD  HARPER,CHARLES A 12/16/2011, 12:23 AM

## 2011-12-16 NOTE — Progress Notes (Signed)
UR chart review completed.  

## 2011-12-16 NOTE — Progress Notes (Signed)
While on phone with MD support person runs out to desk and says that the baby is delivering. MD in route immediately, FP requested for backup, additional staff at bedside.

## 2011-12-17 MED ORDER — IBUPROFEN 600 MG PO TABS: 600.0000 mg | ORAL_TABLET | Freq: Four times a day (QID) | ORAL | Status: DC

## 2011-12-17 NOTE — Discharge Summary (Signed)
Obstetric Discharge Summary Reason for Admission: onset of labor Prenatal Procedures: ultrasound Intrapartum Procedures: spontaneous vaginal delivery Postpartum Procedures: none Complications-Operative and Postpartum: none Hemoglobin  Date Value Range Status  12/16/2011 11.6* 12.0-15.0 (g/dL) Final     HCT  Date Value Range Status  12/16/2011 34.8* 36.0-46.0 (%) Final    Discharge Diagnoses: Term Pregnancy-delivered  Discharge Information: Date: 12/17/2011 Activity: pelvic rest Diet: routine Medications: PNV, Ibuprofen and Colace Condition: stable Instructions: refer to practice specific booklet Discharge to: home Follow-up Information    Follow up with HARPER,CHARLES A, MD. Schedule an appointment as soon as possible for a visit in 2 weeks.   Contact information:   [address] [place] [address] [phone]          Newborn Data: Live born female  Birth Weight: 8 lb 3.8 oz (3735 g) APGAR: 8, 9  Infant in NICU.Marland Kitchen  HARPER,CHARLES A 12/17/2011, 9:15 AM

## 2011-12-17 NOTE — Progress Notes (Signed)
PSYCHOSOCIAL ASSESSMENT ~ MATERNAL/CHILD Name: Canary Brim.                                                                                Age: 26 days   Referral Date: 12/17/11 Reason/Source: NICU Support/NICU  I. FAMILY/HOME ENVIRONMENT Child's Legal Guardian _x__Parent(s) ___Grandparent ___Foster parent ___DSS_________________ Name: Iona Hansen                                   DOB: Nov 15, 1985           Age: 12   Address: [address] [place], [place] [address]  Name: Doreen Beam Sr.                           DOB: //                     Age:   Address: same  Other Household Members/Support Persons Name: Henretter Piekarski (6)          Relationship: sister                    Name:                                         Relationship:                        DOB ___/___/___                   Name:                                         Relationship:                        DOB ___/___/___                   Name:                                         Relationship:                        DOB ___/___/___  C. Other Support: MOB reports a good support system.  MGM here with her today.   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 [employer] _x_Medicaid    [employer]: [place]                __Private Insurance:                   __Self Pay  __Food Stamps   _x_WIC __Work First     __Public Housing     __Section 8    __Maternity Care Coordination/Child Service Coordination/Early Intervention  __School:                                                                         Grade:  __Other:   Cultural and Environment Information Cultural Issues Impacting Care: none known  STRENGTHS _x__Supportive  family/friends _x__Adequate Resources _x__Compliance with medical plan _x__Home prepared for Child (including basic supplies) _x__Understanding of illness      _x__Other: Pediatrician is Dr. Dario Guardian at [facility] Pediatricians RISK FACTORS AND CURRENT PROBLEMS         __x__No Problems Noted                                                                                                                                                                                                                                       Pt              Family     Substance Abuse                                                                ___              ___        Mental Illness  ___              ___  Family/Relationship Issues                                      ___               ___             Abuse/Neglect/Domestic Violence                                         ___         ___  Financial Resources                                        ___              ___             Transportation                                                                        ___               ___  DSS Involvement                                                                   ___              ___  Adjustment to Illness                                                               ___              ___  Knowledge/Cognitive Deficit                                                   ___              ___             Compliance with Treatment                                                 ___                ___  Basic Needs (food, housing, etc.)                                          ___              ___             Housing Concerns                                       ___              ___ Other_____________________________________________________________            SOCIAL WORK ASSESSMENT SW met with FOB at baby's bedside to introduce myself and see how things are  going.  He appears very excited about baby and seems to have a good understanding of the situation.  He was very polite and states no questions or needs at this time.  SW asked if MOB was in her room and he said yes.  He states that this is her day of discharge and she is having a hard time with that.  SW stated understanding and asked if he thought it was okay for SW to visit with MOB now.  He said yes.  SW met with MOB in her first floor room to introduce myself, complete assessment and evaluate how she is coping with baby's admission to NICU.  MOB states that she is irritated by the situation, and then changed her wording to "stressed" instead of irritated.   SW validated feelings and agreed that it is a very stressful and unexpected situation.  She states baby is doing well and that she is hopeful that he will get to go home soon.  She reports living close to the hospital and not having issues with transportation, but is emotional to leave today.  SW informed her of the possibility of rooming in the night before baby is ready for discharge and she informed SW that he will be having labs tomorrow and she was told that there is a chance he will be discharged depending on the results.  (SW later met with charge RN/Linda and bedside RN/Therese who state there is no possibility of discharge for baby tomorrow.)  SW informed MOB and she was very accepting.  She reports having everything she needs for baby at home and a good support system.  She has a 41 year old daughter at home, whose grandmother is caring for while MOB is in the hospital.  MOB seemed appreciative of SW's visit and states no questions or needs at this time.  SW explained support services offered by NICU SWs and asked MOB to contact SW if any questions or needs arise.  SOCIAL WORK PLAN  ___No Further Intervention Required/No Barriers to Discharge   _x__Psychosocial Support and Ongoing Assessment of Needs   ___Patient/Family Education:   ___Child  Protective Services Report   County___________ Date___/____/____   ___Information/Referral to [employer]_________________________   ___Other:

## 2011-12-17 NOTE — Progress Notes (Signed)
Post Partum Day 2 Subjective: no complaints  Objective: Blood pressure 103/59, pulse 97, temperature 98.4 F (36.9 C), temperature source Oral, resp. rate 20, SpO2 98.00%, unknown if currently breastfeeding.  Physical Exam:  General: alert and no distress Lochia: appropriate Uterine Fundus: firm Incision: healing well DVT Evaluation: No evidence of DVT seen on physical exam.   Basename 12/16/11 0540 12/15/11 2315  HGB 11.6* 13.4  HCT 34.8* 39.6    Assessment/Plan: Discharge home   LOS: 2 days   HARPER,CHARLES A 12/17/2011, 8:40 AM

## 2011-12-22 ENCOUNTER — Inpatient Hospital Stay (HOSPITAL_COMMUNITY): Admission: RE | Admit: 2011-12-22 | Payer: Medicaid Other | Source: Ambulatory Visit

## 2012-03-01 NOTE — Telephone Encounter (Signed)
Preadmission screen  

## 2012-07-11 ENCOUNTER — Emergency Department (HOSPITAL_COMMUNITY)
Admission: EM | Admit: 2012-07-11 | Discharge: 2012-07-11 | Disposition: A | Discharge status: Home / Self Care | Payer: Medicaid Other | Attending: Emergency Medicine | Admitting: Emergency Medicine

## 2012-07-11 ENCOUNTER — Encounter (HOSPITAL_COMMUNITY): Payer: Self-pay | Admitting: Family Medicine

## 2012-07-11 DIAGNOSIS — M545 Low back pain, unspecified: Secondary | ICD-10-CM | POA: Insufficient documentation

## 2012-07-11 DIAGNOSIS — Z882 Allergy status to sulfonamides status: Secondary | ICD-10-CM | POA: Insufficient documentation

## 2012-07-11 DIAGNOSIS — M549 Dorsalgia, unspecified: Secondary | ICD-10-CM

## 2012-07-11 MED ORDER — DIAZEPAM 5 MG PO TABS
10.0000 mg | ORAL_TABLET | Freq: Once | ORAL | Status: AC
  Administered 2012-07-11: 10 mg via ORAL
  Filled 2012-07-11: qty 2

## 2012-07-11 MED ORDER — IBUPROFEN 800 MG PO TABS: 800.0000 mg | ORAL_TABLET | Freq: Three times a day (TID) | ORAL | Status: AC

## 2012-07-11 MED ORDER — IBUPROFEN 800 MG PO TABS
800.0000 mg | ORAL_TABLET | Freq: Once | ORAL | Status: AC
  Administered 2012-07-11: 800 mg via ORAL
  Filled 2012-07-11: qty 1

## 2012-07-11 MED ORDER — DIAZEPAM 5 MG PO TABS: 5.0000 mg | ORAL_TABLET | Freq: Two times a day (BID) | ORAL | Status: AC

## 2012-07-11 MED ORDER — OXYCODONE-ACETAMINOPHEN 5-325 MG PO TABS: 2.0000 | ORAL_TABLET | ORAL | Status: AC | PRN

## 2012-07-11 MED ORDER — OXYCODONE-ACETAMINOPHEN 5-325 MG PO TABS
1.0000 | ORAL_TABLET | Freq: Once | ORAL | Status: AC
  Administered 2012-07-11: 1 via ORAL
  Filled 2012-07-11: qty 1

## 2012-07-11 NOTE — ED Provider Notes (Signed)
History     CSN: [id number]  Arrival date & time 07/11/12  1631   First MD Initiated Contact with Patient 07/11/12 1812      Chief Complaint  Patient presents with  . Spasms  . Back Pain    (Consider location/radiation/quality/duration/timing/severity/associated sxs/prior treatment) Patient is a 26 y.o. female presenting with back pain. The history is provided by the patient. No language interpreter was used.  Back Pain  This is a recurrent problem. The current episode started 6 to 12 hours ago. The problem occurs constantly. The problem has been gradually worsening. The pain is associated with no known injury. Pain location: R para spinal pain. The quality of the pain is described as aching. The pain does not radiate. The pain is at a severity of 8/10. The pain is moderate. The symptoms are aggravated by bending, twisting and certain positions. Pertinent negatives include no chest pain, no fever, no numbness, no bowel incontinence, no perianal numbness, no bladder incontinence, no dysuria, no leg pain, no paresthesias, no paresis, no tingling and no weakness. She has tried nothing for the symptoms. Risk factors include obesity.   26 year old female complaining of right lower back pain intermittent since she delivered her 25-month-old baby. States that the pain is achy does not radiate. No bowel or bladder incontinence.  Past Medical History  Diagnosis Date  . Benign heart murmur   . Ulcer     stomach (no longer taking meds)    Past Surgical History  Procedure Date  . Upper gastrointestinal endoscopy   . Wisdom tooth extraction   . Myringotomy     with tubes    Family History  Problem Relation Age of Onset  . Diabetes Mother   . Hypertension Mother   . Heart disease Father   . Anesthesia problems Neg Hx   . Hypotension Neg Hx   . Malignant hyperthermia Neg Hx   . Pseudochol deficiency Neg Hx     History  Substance Use Topics  . Smoking status: Former Games developer  .  Smokeless tobacco: Never Used  . Alcohol Use: No    OB History    Grav Para Term Preterm Abortions TAB SAB Ect Mult Living   2 2 2  0 0 0 0 0 0 2      Review of Systems  Constitutional: Negative.  Negative for fever.  HENT: Negative.   Eyes: Negative.   Respiratory: Negative.   Cardiovascular: Negative.  Negative for chest pain.  Gastrointestinal: Negative.  Negative for bowel incontinence.  Genitourinary: Negative for bladder incontinence and dysuria.  Musculoskeletal: Positive for back pain. Negative for gait problem.       Right lower back pain  Neurological: Negative.  Negative for tingling, weakness, numbness and paresthesias.  Psychiatric/Behavioral: Negative.   All other systems reviewed and are negative.    Allergies  Augmentin and Sulfa antibiotics  Home Medications   Current Outpatient Rx  Name Route Sig Dispense Refill  . ACETAMINOPHEN-CODEINE #3 300-30 MG PO TABS Oral Take 2 tablets by mouth every 4 (four) hours as needed. Pain    . PRENATAL MULTIVITAMIN CH Oral Take 1 tablet by mouth daily.    Marland Kitchen DIAZEPAM 5 MG PO TABS Oral Take 1 tablet (5 mg total) by mouth 2 (two) times daily. 10 tablet 0  . IBUPROFEN 800 MG PO TABS Oral Take 1 tablet (800 mg total) by mouth 3 (three) times daily. 21 tablet 0  . OXYCODONE-ACETAMINOPHEN 5-325 MG PO TABS Oral Take 2  tablets by mouth every 4 (four) hours as needed for pain. 10 tablet 0    BP 111/57  Pulse 70  Temp 98.5 F (36.9 C)  Resp 20  SpO2 100%  LMP 06/10/2012  Breastfeeding? Yes  Physical Exam  Nursing note and vitals reviewed. Constitutional: She is oriented to person, place, and time. She appears well-developed and well-nourished.  HENT:  Head: Normocephalic and atraumatic.  Eyes: Conjunctivae and EOM are normal. Pupils are equal, round, and reactive to light.  Neck: Normal range of motion. Neck supple.  Cardiovascular: Normal rate.   Pulmonary/Chest: Effort normal.  Abdominal: Soft.  Musculoskeletal:  Normal range of motion. She exhibits tenderness. She exhibits no edema.       Right lower back, ambulating without difficulty,  Neurological: She is alert and oriented to person, place, and time. She has normal reflexes. No cranial nerve deficit. Coordination normal.  Skin: Skin is warm and dry.  Psychiatric: She has a normal mood and affect.    ED Course  Procedures (including critical care time)  Labs Reviewed - No data to display No results found.   1. Back pain without radiation       MDM  Right lower back pain intermittent since she delivered 6 months ago. No red flags. No cauda equina symptoms. She will followup with a PCP from the list below. Prescription for ibuprofen, Valium, Percocet. She understands to return to the ER for bowel or bladder incontinence or any perineal numbness, fever.        Remi Haggard, NP 07/11/12 [phone]

## 2012-07-11 NOTE — ED Notes (Signed)
Pt reports muscles spasms to right lower back. States this has been happening once a month for the past 6 months since she had her son. Denies N/V.

## 2012-07-15 NOTE — ED Provider Notes (Signed)
Medical screening examination/treatment/procedure(s) were performed by non-physician practitioner and as supervising physician I was immediately available for consultation/collaboration.  Annayah Worthley, MD 07/15/12 1742 

## 2012-12-29 ENCOUNTER — Encounter (HOSPITAL_COMMUNITY): Payer: Self-pay | Admitting: Emergency Medicine

## 2012-12-29 ENCOUNTER — Emergency Department (HOSPITAL_COMMUNITY)
Admission: EM | Admit: 2012-12-29 | Discharge: 2012-12-29 | Disposition: A | Discharge status: Home / Self Care | Payer: Medicaid Other | Attending: Emergency Medicine | Admitting: Emergency Medicine

## 2012-12-29 DIAGNOSIS — R011 Cardiac murmur, unspecified: Secondary | ICD-10-CM | POA: Insufficient documentation

## 2012-12-29 DIAGNOSIS — Z8711 Personal history of peptic ulcer disease: Secondary | ICD-10-CM | POA: Insufficient documentation

## 2012-12-29 DIAGNOSIS — L089 Local infection of the skin and subcutaneous tissue, unspecified: Secondary | ICD-10-CM | POA: Insufficient documentation

## 2012-12-29 DIAGNOSIS — F17200 Nicotine dependence, unspecified, uncomplicated: Secondary | ICD-10-CM | POA: Insufficient documentation

## 2012-12-29 HISTORY — DX: Peptic ulcer, site unspecified, unspecified as acute or chronic, without hemorrhage or perforation: K27.9

## 2012-12-29 MED ORDER — CLINDAMYCIN HCL 150 MG PO CAPS: 300.0000 mg | ORAL_CAPSULE | Freq: Three times a day (TID) | ORAL | Status: DC

## 2012-12-29 MED ORDER — HYDROCODONE-ACETAMINOPHEN 5-325 MG PO TABS: 1.0000 | ORAL_TABLET | ORAL | Status: DC | PRN

## 2012-12-29 NOTE — ED Notes (Signed)
Pt has infection at base of nail on right index finger. Onset 4 days ago.

## 2012-12-29 NOTE — ED Provider Notes (Signed)
History     CSN: [id number]  Arrival date & time 12/29/12  1019   First MD Initiated Contact with Patient 12/29/12 1024      No chief complaint on file.   (Consider location/radiation/quality/duration/timing/severity/associated sxs/prior treatment) HPI Comments: Patient is a 27 year old female who presents with a 4 day history of right index finger pain. Patient reports gradual onset and progressive worsening of the pain. The pain is throbbing and severe and does not radiate. Patient denies any injury. She reports associated redness and tenderness around the nail of the affected finger. She has not tried anything for symptoms. Finger movement makes the pain worse. No alleviating factors.    Past Medical History  Diagnosis Date  . Benign heart murmur   . Ulcer     stomach (no longer taking meds)  . Peptic ulcer     Past Surgical History  Procedure Laterality Date  . Upper gastrointestinal endoscopy    . Wisdom tooth extraction    . Myringotomy      with tubes    Family History  Problem Relation Age of Onset  . Diabetes Mother   . Hypertension Mother   . Heart disease Father   . Anesthesia problems Neg Hx   . Hypotension Neg Hx   . Malignant hyperthermia Neg Hx   . Pseudochol deficiency Neg Hx     History  Substance Use Topics  . Smoking status: Current Every Day Smoker  . Smokeless tobacco: Never Used  . Alcohol Use: No    OB History   Grav Para Term Preterm Abortions TAB SAB Ect Mult Living   2 2 2  0 0 0 0 0 0 2      Review of Systems  Skin: Positive for wound.  All other systems reviewed and are negative.    Allergies  Augmentin and Sulfa antibiotics  Home Medications   Current Outpatient Rx  Name  Route  Sig  Dispense  Refill  . acetaminophen-codeine (TYLENOL #3) 300-30 MG per tablet   Oral   Take 2 tablets by mouth every 4 (four) hours as needed. Pain         . Prenatal Vit-Fe Fumarate-FA (PRENATAL MULTIVITAMIN) TABS   Oral   Take 1  tablet by mouth daily.           BP 115/69  Pulse 83  Temp(Src) 98.8 F (37.1 C) (Oral)  SpO2 98%  Physical Exam  Nursing note and vitals reviewed. Constitutional: She appears well-developed and well-nourished. No distress.  HENT:  Head: Normocephalic and atraumatic.  Eyes: Conjunctivae are normal.  Neck: Normal range of motion.  Cardiovascular: Normal rate and regular rhythm.  Exam reveals no gallop and no friction rub.   No murmur heard. Pulmonary/Chest: Effort normal and breath sounds normal. She has no wheezes. She has no rales. She exhibits no tenderness.  Abdominal: Soft. She exhibits no distension.  Musculoskeletal: Normal range of motion.  Neurological: She is alert.  Speech is goal-oriented. Moves limbs without ataxia.   Skin: Skin is warm and dry.  Area of erythema and tenderness around nail bed of right index finger. No pus or fluctuance noted.   Psychiatric: She has a normal mood and affect. Her behavior is normal.    ED Course  Procedures (including critical care time)  Labs Reviewed - No data to display No results found.   1. Skin infection       MDM  10:45 AM Patient will have Bactrim for  her skin infection and vicodin for pain. No pus noted so I will not incise the area. Vitals stable. Patient afebrile. No further evaluation needed at this time.        Emilia Beck, PA-C 12/31/12 [phone]

## 2013-01-01 NOTE — ED Provider Notes (Signed)
Medical screening examination/treatment/procedure(s) were performed by non-physician practitioner and as supervising physician I was immediately available for consultation/collaboration.   Lyanne Co, MD 01/01/13 [phone]

## 2013-02-04 ENCOUNTER — Encounter (HOSPITAL_COMMUNITY): Payer: Self-pay | Admitting: *Deleted

## 2013-02-04 ENCOUNTER — Emergency Department (HOSPITAL_COMMUNITY)
Admission: EM | Admit: 2013-02-04 | Discharge: 2013-02-04 | Disposition: A | Discharge status: Home / Self Care | Payer: Medicaid Other | Attending: Emergency Medicine | Admitting: Emergency Medicine

## 2013-02-04 DIAGNOSIS — Z8711 Personal history of peptic ulcer disease: Secondary | ICD-10-CM | POA: Insufficient documentation

## 2013-02-04 DIAGNOSIS — M62838 Other muscle spasm: Secondary | ICD-10-CM

## 2013-02-04 DIAGNOSIS — R011 Cardiac murmur, unspecified: Secondary | ICD-10-CM | POA: Insufficient documentation

## 2013-02-04 DIAGNOSIS — Z872 Personal history of diseases of the skin and subcutaneous tissue: Secondary | ICD-10-CM | POA: Insufficient documentation

## 2013-02-04 DIAGNOSIS — Z3202 Encounter for pregnancy test, result negative: Secondary | ICD-10-CM | POA: Insufficient documentation

## 2013-02-04 DIAGNOSIS — F17200 Nicotine dependence, unspecified, uncomplicated: Secondary | ICD-10-CM | POA: Insufficient documentation

## 2013-02-04 DIAGNOSIS — M5380 Other specified dorsopathies, site unspecified: Secondary | ICD-10-CM | POA: Insufficient documentation

## 2013-02-04 LAB — URINALYSIS, ROUTINE W REFLEX MICROSCOPIC
Glucose, UA: NEGATIVE mg/dL
Nitrite: NEGATIVE
Specific Gravity, Urine: 1.02 (ref 1.005–1.030)
pH: 5.5 (ref 5.0–8.0)

## 2013-02-04 LAB — URINE MICROSCOPIC-ADD ON

## 2013-02-04 LAB — POCT I-STAT, CHEM 8
Calcium, Ion: 1.2 mmol/L (ref 1.12–1.23)
HCT: 41 % (ref 36.0–46.0)
Hemoglobin: 13.9 g/dL (ref 12.0–15.0)
TCO2: 28 mmol/L (ref 0–100)

## 2013-02-04 LAB — POCT PREGNANCY, URINE: Preg Test, Ur: NEGATIVE

## 2013-02-04 MED ORDER — NAPROXEN 500 MG PO TABS: 500.0000 mg | ORAL_TABLET | Freq: Two times a day (BID) | ORAL | Status: DC

## 2013-02-04 MED ORDER — CYCLOBENZAPRINE HCL 10 MG PO TABS: 10.0000 mg | ORAL_TABLET | Freq: Three times a day (TID) | ORAL | Status: DC | PRN

## 2013-02-04 MED ORDER — OXYCODONE-ACETAMINOPHEN 5-325 MG PO TABS
1.0000 | ORAL_TABLET | Freq: Once | ORAL | Status: AC
  Administered 2013-02-04: 1 via ORAL
  Filled 2013-02-04: qty 1

## 2013-02-04 NOTE — ED Notes (Signed)
Reports muscle spasms to lower back that started last night. Denies injury to back. Ambulatory at triage.

## 2013-02-04 NOTE — Discharge Instructions (Signed)

## 2013-02-04 NOTE — ED Provider Notes (Signed)
History  This chart was scribed for non-physician practitioner, Jaci Carrel, PA-C,  working with Carleene Cooper III, MD by Shari Heritage, ED Scribe. This patient was seen in room TR06C/TR06C and the patient's care was started at 1619.   CSN: [id number]  Arrival date & time 02/04/13  1400   First MD Initiated Contact with Patient 02/04/13 1619      Chief Complaint  Patient presents with  . Back Pain    The history is provided by the patient. No language interpreter was used.    HPI Comments: Tanya Terry is a 27 y.o. female who presents to the Emergency Department complaining of moderate, constant, non-radiating right flank pain onset last night. She states that pain feels like a "contraction." She says that she was working at [employer] when pain began and denies any strenuous activity prior. She denies dysuria, urinary frequency or hematuria. Patient has taken Tylenol and applied heat to the area without relief. Patient has no other complaints or pain at this time. Her medical history includes peptic ulcer. Patient is a smoker.   Past Medical History  Diagnosis Date  . Benign heart murmur   . Ulcer     stomach (no longer taking meds)  . Peptic ulcer     Past Surgical History  Procedure Laterality Date  . Upper gastrointestinal endoscopy    . Wisdom tooth extraction    . Myringotomy      with tubes    Family History  Problem Relation Age of Onset  . Diabetes Mother   . Hypertension Mother   . Heart disease Father   . Anesthesia problems Neg Hx   . Hypotension Neg Hx   . Malignant hyperthermia Neg Hx   . Pseudochol deficiency Neg Hx     History  Substance Use Topics  . Smoking status: Current Every Day Smoker  . Smokeless tobacco: Never Used  . Alcohol Use: No    OB History   Grav Para Term Preterm Abortions TAB SAB Ect Mult Living   2 2 2  0 0 0 0 0 0 2      Review of Systems A complete 10 system review of systems was obtained and all systems are negative  except as noted in the HPI and PMH.   Allergies  Augmentin and Sulfa antibiotics  Home Medications   Current Outpatient Rx  Name  Route  Sig  Dispense  Refill  . clindamycin (CLEOCIN) 150 MG capsule   Oral   Take 2 capsules (300 mg total) by mouth 3 (three) times daily. May dispense as 150mg  capsules   60 capsule   0   . HYDROcodone-acetaminophen (NORCO/VICODIN) 5-325 MG per tablet   Oral   Take 1 tablet by mouth every 4 (four) hours as needed for pain.   5 tablet   0   . Multiple Vitamin (MULTIVITAMIN WITH MINERALS) TABS   Oral   Take 1 tablet by mouth daily.           Triage Vitals: BP 113/80  Pulse 72  Temp(Src) 98 F (36.7 C) (Oral)  Resp 18  SpO2 99%  LMP 01/21/2013  Physical Exam  Nursing note and vitals reviewed. Constitutional: She appears well-developed and well-nourished. No distress.  HENT:  Head: Normocephalic and atraumatic.  Eyes: Conjunctivae and EOM are normal.  Neck: Normal range of motion. Neck supple.  Cardiovascular:  Intact distal pulses, capillary refill < 3 seconds  Musculoskeletal:  No back ttp. All other extremities  with normal ROM  Neurological:  No sensory deficit  Skin: She is not diaphoretic.  Skin intact, no tenting    ED Course  Procedures (including critical care time) DIAGNOSTIC STUDIES: Oxygen Saturation is 99% on room air, normal by my interpretation.    COORDINATION OF CARE: 4:40 PM- Patient presents with right flank pain for the past day. Denies any urinary symptoms but will order i-stat and UA to rule out kidney stone. Will give Percocet 5-325 mg. Patient informed of current plan for treatment and evaluation and agrees with plan at this time.       Labs Reviewed - No data to display No results found.   No diagnosis found.    MDM  Patient with back pain.  No neurological deficits and normal neuro exam.  Patient can walk but states is painful.  No loss of bowel or bladder control.  No concern for cauda  equina.  No fever, night sweats, weight loss, h/o cancer, IVDU.  RICE protocol and pain medicine indicated and discussed with patient.        I personally performed the services described in this documentation, which was scribed in my presence. The recorded information has been reviewed and is accurate.      Jaci Carrel, [place] 02/04/13 1747

## 2013-02-05 NOTE — ED Provider Notes (Signed)
Medical screening examination/treatment/procedure(s) were performed by non-physician practitioner and as supervising physician I was immediately available for consultation/collaboration.   Carleene Cooper III, MD 02/05/13 [phone]

## 2013-05-31 ENCOUNTER — Ambulatory Visit: Payer: Self-pay | Admitting: Obstetrics & Gynecology

## 2013-06-25 ENCOUNTER — Ambulatory Visit: Payer: Self-pay | Admitting: Obstetrics & Gynecology

## 2013-09-13 ENCOUNTER — Ambulatory Visit (INDEPENDENT_AMBULATORY_CARE_PROVIDER_SITE_OTHER): Payer: Medicaid Other | Admitting: Obstetrics & Gynecology

## 2013-09-13 ENCOUNTER — Encounter: Payer: Self-pay | Admitting: Obstetrics & Gynecology

## 2013-09-13 VITALS — Temp 98.8°F | Ht 66.0 in | Wt 235.0 lb

## 2013-09-13 DIAGNOSIS — Z3202 Encounter for pregnancy test, result negative: Secondary | ICD-10-CM

## 2013-09-13 DIAGNOSIS — Z23 Encounter for immunization: Secondary | ICD-10-CM

## 2013-09-13 DIAGNOSIS — Z0000 Encounter for general adult medical examination without abnormal findings: Secondary | ICD-10-CM

## 2013-09-13 LAB — HEMATOCRIT: HCT: 39.9 % (ref 36.0–46.0)

## 2013-09-13 NOTE — Progress Notes (Signed)
.   Subjective:     Tanya Terry is a 27 y.o. female here for a routine exam.  No current complaints.  Personal health questionnaire reviewed: no.   Gynecologic History Patient's last menstrual period was 08/15/2013. Contraception: none Last Pap: 2012. Results were: normal Last mammogram: N/A  Obstetric History OB History  Gravida Para Term Preterm AB SAB TAB Ectopic Multiple Living  2 2 2  0 0 0 0 0 0 2    # Outcome Date GA Lbr Len/2nd Weight Sex Delivery Anes PTL Lv  2 TRM 12/15/11 [redacted]w[redacted]d 03:09 8 lb 3.8 oz (3.735 kg) M SVD Local  Y     Comments: wnl  1 TRM                The following portions of the patient's history were reviewed and updated as appropriate: allergies, current medications, past family history, past medical history, past social history, past surgical history and problem list.  Review of Systems Pertinent items are noted in HPI.    Objective:      General appearance: alert Breasts: normal appearance, no masses or tenderness Abdomen: soft, non-tender; bowel sounds normal; no masses,  no organomegaly Pelvic: cervix normal in appearance, external genitalia normal, no adnexal masses or tenderness, uterus normal size, shape, and consistency and vagina normal without discharge       Assessment:    Healthy female exam.    Plan:    Education reviewed: weight bearing exercise and trying to optimize weight.  Return prn

## 2013-09-14 ENCOUNTER — Encounter: Payer: Self-pay | Admitting: Obstetrics & Gynecology

## 2013-09-14 LAB — HIV ANTIBODY (ROUTINE TESTING W REFLEX): HIV: NONREACTIVE

## 2013-09-14 NOTE — Patient Instructions (Signed)
Obesity Obesity is defined as having too much total body fat and a body mass index (BMI) of 30 or more. BMI is an estimate of body fat and is calculated from your height and weight. Obesity happens when you consume more calories than you can burn by exercising or performing daily physical tasks. Prolonged obesity can cause major illnesses or emergencies, such as:   A stroke.  Heart disease.  Diabetes.  Cancer.  Arthritis.  High blood pressure (hypertension).  High cholesterol.  Sleep apnea.  Erectile dysfunction.  Infertility problems. CAUSES   Regularly eating unhealthy foods.  Physical inactivity.  Certain disorders, such as an underactive thyroid (hypothyroidism), Cushing's syndrome, and polycystic ovarian syndrome.  Certain medicines, such as steroids, some depression medicines, and antipsychotics.  Genetics.  Lack of sleep. DIAGNOSIS  A caregiver can diagnose obesity after calculating your BMI. Obesity will be diagnosed if your BMI is 30 or higher.  There are other methods of measuring obesity levels. Some other methods include measuring your skin fold thickness, your waist circumference, and comparing your hip circumference to your waist circumference. TREATMENT  A healthy treatment program includes some or all of the following:  Long-term dietary changes.  Exercise and physical activity.  Behavioral and lifestyle changes.  Medicine only under the supervision of your caregiver. Medicines may help, but only if they are used with diet and exercise programs. An unhealthy treatment program includes:  Fasting.  Fad diets.  Supplements and drugs. These choices do not succeed in long-term weight control.  HOME CARE INSTRUCTIONS   Exercise and perform physical activity as directed by your caregiver. To increase physical activity, try the following:  Use stairs instead of elevators.  Park farther away from store entrances.  Garden, bike, or walk instead of  watching television or using the computer.  Eat healthy, low-calorie foods and drinks on a regular basis. Eat more fruits and vegetables. Use low-calorie cookbooks or take healthy cooking classes.  Limit fast food, sweets, and processed snack foods.  Eat smaller portions.  Keep a daily journal of everything you eat. There are many free websites to help you with this. It may be helpful to measure your foods so you can determine if you are eating the correct portion sizes.  Avoid drinking alcohol. Drink more water and drinks without calories.  Take vitamins and supplements only as recommended by your caregiver.  Weight-loss support groups, Registered Dieticians, counselors, and stress reduction education can also be very helpful. SEEK IMMEDIATE MEDICAL CARE IF:  You have chest pain or tightness.  You have trouble breathing or feel short of breath.  You have weakness or leg numbness.  You feel confused or have trouble talking.  You have sudden changes in your vision. MAKE SURE YOU:  Understand these instructions.  Will watch your condition.  Will get help right away if you are not doing well or get worse. Document Released: 12/02/2004 Document Revised: 04/25/2012 Document Reviewed: 12/01/2011 [employer] Patient Information 2014 [employer].  

## 2013-09-18 LAB — PAP IG, CT-NG NAA, HPV HIGH-RISK: HPV DNA High Risk: NOT DETECTED

## 2013-12-27 ENCOUNTER — Encounter (HOSPITAL_COMMUNITY): Payer: Self-pay | Admitting: Emergency Medicine

## 2013-12-27 ENCOUNTER — Emergency Department (HOSPITAL_COMMUNITY)
Admission: EM | Admit: 2013-12-27 | Discharge: 2013-12-27 | Disposition: A | Discharge status: Home / Self Care | Payer: Medicaid Other | Attending: Emergency Medicine | Admitting: Emergency Medicine

## 2013-12-27 DIAGNOSIS — R011 Cardiac murmur, unspecified: Secondary | ICD-10-CM | POA: Insufficient documentation

## 2013-12-27 DIAGNOSIS — J45901 Unspecified asthma with (acute) exacerbation: Secondary | ICD-10-CM | POA: Insufficient documentation

## 2013-12-27 DIAGNOSIS — Z8711 Personal history of peptic ulcer disease: Secondary | ICD-10-CM | POA: Insufficient documentation

## 2013-12-27 DIAGNOSIS — J45909 Unspecified asthma, uncomplicated: Secondary | ICD-10-CM

## 2013-12-27 DIAGNOSIS — M940 Chondrocostal junction syndrome [Tietze]: Secondary | ICD-10-CM | POA: Insufficient documentation

## 2013-12-27 DIAGNOSIS — F17200 Nicotine dependence, unspecified, uncomplicated: Secondary | ICD-10-CM | POA: Insufficient documentation

## 2013-12-27 DIAGNOSIS — R0789 Other chest pain: Secondary | ICD-10-CM

## 2013-12-27 MED ORDER — ALBUTEROL SULFATE (2.5 MG/3ML) 0.083% IN NEBU
5.0000 mg | INHALATION_SOLUTION | Freq: Once | RESPIRATORY_TRACT | Status: AC
  Administered 2013-12-27: 5 mg via RESPIRATORY_TRACT
  Filled 2013-12-27: qty 6

## 2013-12-27 MED ORDER — ALBUTEROL SULFATE HFA 108 (90 BASE) MCG/ACT IN AERS
2.0000 | INHALATION_SPRAY | Freq: Once | RESPIRATORY_TRACT | Status: AC
  Administered 2013-12-27: 2 via RESPIRATORY_TRACT
  Filled 2013-12-27: qty 6.7

## 2013-12-27 MED ORDER — KETOROLAC TROMETHAMINE 60 MG/2ML IM SOLN
60.0000 mg | Freq: Once | INTRAMUSCULAR | Status: AC
  Administered 2013-12-27: 60 mg via INTRAMUSCULAR
  Filled 2013-12-27: qty 2

## 2013-12-27 MED ORDER — IBUPROFEN 800 MG PO TABS: 800.0000 mg | ORAL_TABLET | Freq: Three times a day (TID) | ORAL | Status: DC

## 2013-12-27 MED ORDER — ASPIRIN 325 MG PO TABS: 325.0000 mg | ORAL_TABLET | ORAL | Status: DC

## 2013-12-27 NOTE — ED Notes (Signed)
Pt states chest pain x 2 days

## 2013-12-27 NOTE — Discharge Instructions (Signed)
Rest, avoid heavy lifting or hard physical activity. Take ibuprofen every 8 hours as needed for pain. Use inhaler every 4-6 hours as needed.  Chest Wall Pain Chest wall pain is pain in or around the bones and muscles of your chest. It may take up to 6 weeks to get better. It may take longer if you must stay physically active in your work and activities.  CAUSES  Chest wall pain may happen on its own. However, it may be caused by:  A viral illness like the flu.  Injury.  Coughing.  Exercise.  Arthritis.  Fibromyalgia.  Shingles. HOME CARE INSTRUCTIONS   Avoid overtiring physical activity. Try not to strain or perform activities that cause pain. This includes any activities using your chest or your abdominal and side muscles, especially if heavy weights are used.  Put ice on the sore area.  Put ice in a plastic bag.  Place a towel between your skin and the bag.  Leave the ice on for 15-20 minutes per hour while awake for the first 2 days.  Only take over-the-counter or prescription medicines for pain, discomfort, or fever as directed by your caregiver. SEEK IMMEDIATE MEDICAL CARE IF:   Your pain increases, or you are very uncomfortable.  You have a fever.  Your chest pain becomes worse.  You have new, unexplained symptoms.  You have nausea or vomiting.  You feel sweaty or lightheaded.  You have a cough with phlegm (sputum), or you cough up blood. MAKE SURE YOU:   Understand these instructions.  Will watch your condition.  Will get help right away if you are not doing well or get worse. Document Released: 10/25/2005 Document Revised: 01/17/2012 Document Reviewed: 06/21/2011 [facility] Patient Information 2014 [place], [place].  Bronchospasm, Adult A bronchospasm is a spasm or tightening of the airways going into the lungs. During a bronchospasm breathing becomes more difficult because the airways get smaller. When this happens there can be coughing, a whistling  sound when breathing (wheezing), and difficulty breathing. Bronchospasm is often associated with asthma, but not all patients who experience a bronchospasm have asthma. CAUSES  A bronchospasm is caused by inflammation or irritation of the airways. The inflammation or irritation may be triggered by:   Allergies (such as to animals, pollen, food, or mold). Allergens that cause bronchospasm may cause wheezing immediately after exposure or many hours later.   Infection. Viral infections are believed to be the most common cause of bronchospasm.   Exercise.   Irritants (such as pollution, cigarette smoke, strong odors, aerosol sprays, and paint fumes).   Weather changes. Winds increase molds and pollens in the air. Rain refreshes the air by washing irritants out. Cold air may cause inflammation.   Stress and emotional upset.  SIGNS AND SYMPTOMS   Wheezing.   Excessive nighttime coughing.   Frequent or severe coughing with a simple cold.   Chest tightness.   Shortness of breath.  DIAGNOSIS  Bronchospasm is usually diagnosed through a history and physical exam. Tests, such as chest X-rays, are sometimes done to look for other conditions. TREATMENT   Inhaled medicines can be given to open up your airways and help you breathe. The medicines can be given using either an inhaler or a nebulizer machine.  Corticosteroid medicines may be given for severe bronchospasm, usually when it is associated with asthma. HOME CARE INSTRUCTIONS   Always have a plan prepared for seeking medical care. Know when to call your health care provider and local emergency  services (911 in the [place] Know where you can access local emergency care.  Only take medicines as directed by your health care provider.  If you were prescribed an inhaler or nebulizer machine, ask your health care provider to explain how to use it correctly. Always use a spacer with your inhaler if you were given one.  It is  necessary to remain calm during an attack. Try to relax and breathe more slowly.  Control your home environment in the following ways:   Change your heating and air conditioning filter at least once a month.   Limit your use of fireplaces and wood stoves.  Do not smoke and do not allow smoking in your home.   Avoid exposure to perfumes and fragrances.   Get rid of pests (such as roaches and mice) and their droppings.   Throw away plants if you see mold on them.   Keep your house clean and dust free.   Replace carpet with wood, tile, or vinyl flooring. Carpet can trap dander and dust.   Use allergy-proof pillows, mattress covers, and box spring covers.   Wash bed sheets and blankets every week in hot water and dry them in a dryer.   Use blankets that are made of polyester or cotton.   Wash hands frequently. SEEK MEDICAL CARE IF:   You have muscle aches.   You have chest pain.   The sputum changes from clear or white to yellow, green, gray, or bloody.   The sputum you cough up gets thicker.   There are problems that may be related to the medicine you are given, such as a rash, itching, swelling, or trouble breathing.  SEEK IMMEDIATE MEDICAL CARE IF:   You have worsening wheezing and coughing even after taking your prescribed medicines.   You have increased difficulty breathing.   You develop severe chest pain. MAKE SURE YOU:   Understand these instructions.  Will watch your condition.  Will get help right away if you are not doing well or get worse. Document Released: 10/28/2003 Document Revised: 06/27/2013 Document Reviewed: 04/16/2013 [facility] Patient Information 2014 [place].  Costochondritis Costochondritis, sometimes called Tietze syndrome, is a swelling and irritation (inflammation) of the tissue (cartilage) that connects your ribs with your breastbone (sternum). It causes pain in the chest and rib area. Costochondritis usually  goes away on its own over time. It can take up to 6 weeks or longer to get better, especially if you are unable to limit your activities. CAUSES  Some cases of costochondritis have no known cause. Possible causes include:  Injury (trauma).  Exercise or activity such as lifting.  Severe coughing. SIGNS AND SYMPTOMS  Pain and tenderness in the chest and rib area.  Pain that gets worse when coughing or taking deep breaths.  Pain that gets worse with specific movements. DIAGNOSIS  Your health care provider will do a physical exam and ask about your symptoms. Chest X-rays or other tests may be done to rule out other problems. TREATMENT  Costochondritis usually goes away on its own over time. Your health care provider may prescribe medicine to help relieve pain. HOME CARE INSTRUCTIONS   Avoid exhausting physical activity. Try not to strain your ribs during normal activity. This would include any activities using chest, abdominal, and side muscles, especially if heavy weights are used.  Apply ice to the affected area for the first 2 days after the pain begins.  Put ice in a plastic bag.  Place a  towel between your skin and the bag.  Leave the ice on for 20 minutes, 2 3 times a day.  Only take over-the-counter or prescription medicines as directed by your health care provider. SEEK MEDICAL CARE IF:  You have redness or swelling at the rib joints. These are signs of infection.  Your pain does not go away despite rest or medicine. SEEK IMMEDIATE MEDICAL CARE IF:   Your pain increases or you are very uncomfortable.  You have shortness of breath or difficulty breathing.  You cough up blood.  You have worse chest pains, sweating, or vomiting.  You have a fever or persistent symptoms for more than 2 3 days.  You have a fever and your symptoms suddenly get worse. MAKE SURE YOU:   Understand these instructions.  Will watch your condition.  Will get help right away if you are  not doing well or get worse. Document Released: 08/04/2005 Document Revised: 08/15/2013 Document Reviewed: 05/29/2013 [facility] Patient Information 2014 [place], [place].

## 2013-12-27 NOTE — ED Provider Notes (Signed)
CSN: [id number]     Arrival date & time 12/27/13  1608 History   First MD Initiated Contact with Patient 12/27/13 1609     Chief Complaint  Patient presents with  . Chest Pain     (Consider location/radiation/quality/duration/timing/severity/associated sxs/prior Treatment) HPI Comments: Pt is a 28 y/o female with a PMHx of PUD and heart murmur "as a baby" who presents to the ED complaining of gradual onset, non-radiating, constant mid-sternal chest tightness x 2 days. Symptoms worsened when walking outside into the cold, relieved when walking inside. Admits to associated difficulty taking a deep breath when she is out in the cold. Denies fever, chills, nausea, vomiting, diaphoresis, wheezing or cough. No history of asthma. She is a daily smoker. Also states she has been lifting heavy things at her job throughout the day, pain worsened when lifting. Dad has a history of an MI. No personal or family hx of blood clots.  Patient is a 28 y.o. female presenting with chest pain. The history is provided by the patient.  Chest Pain Associated symptoms: shortness of breath     Past Medical History  Diagnosis Date  . Benign heart murmur   . Ulcer     stomach (no longer taking meds)  . Peptic ulcer    Past Surgical History  Procedure Laterality Date  . Upper gastrointestinal endoscopy    . Wisdom tooth extraction    . Myringotomy      with tubes   Family History  Problem Relation Age of Onset  . Diabetes Mother   . Hypertension Mother   . Heart disease Father   . Hypertension Father   . Anesthesia problems Neg Hx   . Hypotension Neg Hx   . Malignant hyperthermia Neg Hx   . Pseudochol deficiency Neg Hx    History  Substance Use Topics  . Smoking status: Current Every Day Smoker    Types: Cigarettes  . Smokeless tobacco: Never Used  . Alcohol Use: No   OB History   Grav Para Term Preterm Abortions TAB SAB Ect Mult Living   2 2 2  0 0 0 0 0 0 2     Review of Systems    Respiratory: Positive for chest tightness and shortness of breath.   All other systems reviewed and are negative.      Allergies  Augmentin and Sulfa antibiotics  Home Medications   Current Outpatient Rx  Name  Route  Sig  Dispense  Refill  . ibuprofen (ADVIL,MOTRIN) 800 MG tablet   Oral   Take 1 tablet (800 mg total) by mouth 3 (three) times daily.   21 tablet   0    BP 140/97  Pulse 72  Temp(Src) 97.8 F (36.6 C) (Oral)  Resp 14  SpO2 99%  LMP 11/21/2013 Physical Exam  Nursing note and vitals reviewed. Constitutional: She is oriented to person, place, and time. She appears well-developed and well-nourished. No distress.  HENT:  Head: Normocephalic and atraumatic.  Mouth/Throat: Oropharynx is clear and moist.  Eyes: Conjunctivae and EOM are normal. Pupils are equal, round, and reactive to light.  Neck: Normal range of motion. Neck supple. No JVD present.  Cardiovascular: Normal rate, regular rhythm, normal heart sounds and intact distal pulses.   No extremity edema. Equal distal pulses.  Pulmonary/Chest: Effort normal and breath sounds normal. She exhibits tenderness.    Abdominal: Soft. Bowel sounds are normal. There is no tenderness.  Musculoskeletal: Normal range of motion. She exhibits no edema.  Neurological: She is alert and oriented to person, place, and time. She has normal strength. No sensory deficit.  Moves limbs without ataxia. Speech fluent, goal oriented. Equal grip strength.  Skin: Skin is warm and dry. She is not diaphoretic.  Psychiatric: She has a normal mood and affect. Her behavior is normal.    ED Course  Procedures (including critical care time) Labs Review Labs Reviewed - No data to display Imaging Review No results found.  EKG Interpretation   None       MDM   Final diagnoses:  Chest wall pain  Costochondritis  Reactive airway disease   Patient presenting with chest tightness related to weather changes. Also has  reproducible chest pain, states she lifts heavy things at work. She is well appearing and in no apparent distress, generally healthy 67 -year-old female. Pain improved after Toradol, chest tightness resolved after albuterol treatment. Patient stable for discharge, discharge home with albuterol inhaler. Advised NSAIDs. Doubt cardiac in origin, low risk HEART score 1. PERC negative. Return precautions given. Patient states understanding of treatment care plan and is agreeable.   Trevor Mace, PA-C 12/27/13 1745

## 2013-12-27 NOTE — ED Provider Notes (Signed)
Medical screening examination/treatment/procedure(s) were performed by non-physician practitioner and as supervising physician I was immediately available for consultation/collaboration.  EKG Interpretation   None         Dagmar HaitWilliam Athira Janowicz, MD 12/27/13 [phone]

## 2013-12-27 NOTE — ED Notes (Signed)
Pt states that started having central chest tightness that started yesterday. Pt denies anything make it worse or better. 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

## 2014-03-28 ENCOUNTER — Emergency Department (HOSPITAL_COMMUNITY): Payer: Medicaid Other

## 2014-03-28 ENCOUNTER — Encounter (HOSPITAL_COMMUNITY): Payer: Self-pay | Admitting: Emergency Medicine

## 2014-03-28 ENCOUNTER — Emergency Department (HOSPITAL_COMMUNITY)
Admission: EM | Admit: 2014-03-28 | Discharge: 2014-03-28 | Disposition: A | Discharge status: Home / Self Care | Payer: Medicaid Other | Attending: Emergency Medicine | Admitting: Emergency Medicine

## 2014-03-28 DIAGNOSIS — R011 Cardiac murmur, unspecified: Secondary | ICD-10-CM | POA: Diagnosis not present

## 2014-03-28 DIAGNOSIS — F17200 Nicotine dependence, unspecified, uncomplicated: Secondary | ICD-10-CM | POA: Insufficient documentation

## 2014-03-28 DIAGNOSIS — Y929 Unspecified place or not applicable: Secondary | ICD-10-CM | POA: Diagnosis not present

## 2014-03-28 DIAGNOSIS — M25511 Pain in right shoulder: Secondary | ICD-10-CM

## 2014-03-28 DIAGNOSIS — X500XXA Overexertion from strenuous movement or load, initial encounter: Secondary | ICD-10-CM | POA: Diagnosis not present

## 2014-03-28 DIAGNOSIS — S46909A Unspecified injury of unspecified muscle, fascia and tendon at shoulder and upper arm level, unspecified arm, initial encounter: Secondary | ICD-10-CM | POA: Insufficient documentation

## 2014-03-28 DIAGNOSIS — S4980XA Other specified injuries of shoulder and upper arm, unspecified arm, initial encounter: Secondary | ICD-10-CM | POA: Insufficient documentation

## 2014-03-28 DIAGNOSIS — Z8719 Personal history of other diseases of the digestive system: Secondary | ICD-10-CM | POA: Diagnosis not present

## 2014-03-28 DIAGNOSIS — Z79899 Other long term (current) drug therapy: Secondary | ICD-10-CM | POA: Insufficient documentation

## 2014-03-28 DIAGNOSIS — Y9389 Activity, other specified: Secondary | ICD-10-CM | POA: Insufficient documentation

## 2014-03-28 MED ORDER — OXYCODONE-ACETAMINOPHEN 5-325 MG PO TABS: ORAL_TABLET | ORAL | Status: DC

## 2014-03-28 NOTE — ED Notes (Addendum)
Pt reports R shoulder popped out of socket and then went back in. Same thing happened this am. Pt typing on cell phone with R hand and moves shoulder. History of shoulder popping out of socket. No surgery hx to shoulder. Denies any injury.

## 2014-03-28 NOTE — ED Provider Notes (Signed)
CSN: [id number]     Arrival date & time 03/28/14  0932 History   First MD Initiated Contact with Patient 03/28/14 1009     Chief Complaint  Patient presents with  . Shoulder Injury     (Consider location/radiation/quality/duration/timing/severity/associated sxs/prior Treatment) HPI  Tanya Terry is a 28 y.o. female complaining of moderate to severe right shoulder pain. Patient states she frequently dislocates the right shoulder, she states that she has dislocated it twice in the last 24 hours. First incident was when she raised up her hands over her head to stretch. Second incident was when she was putting her here into a ponytail. States that she pushes against a wall to reduce it. Patient is right-hand dominant, has been advised that she will need rotator cuff surgery. Patient does not follow with or so, states that she is afraid of surgery. Patient denies numbness, weakness, paresthesia. Patient is right-hand dominant. No pain medication prior to arrival.   Past Medical History  Diagnosis Date  . Benign heart murmur   . Ulcer     stomach (no longer taking meds)  . Peptic ulcer    Past Surgical History  Procedure Laterality Date  . Upper gastrointestinal endoscopy    . Wisdom tooth extraction    . Myringotomy      with tubes   Family History  Problem Relation Age of Onset  . Diabetes Mother   . Hypertension Mother   . Heart disease Father   . Hypertension Father   . Anesthesia problems Neg Hx   . Hypotension Neg Hx   . Malignant hyperthermia Neg Hx   . Pseudochol deficiency Neg Hx    History  Substance Use Topics  . Smoking status: Current Every Day Smoker    Types: Cigarettes  . Smokeless tobacco: Never Used  . Alcohol Use: No   OB History   Grav Para Term Preterm Abortions TAB SAB Ect Mult Living   2 2 2  0 0 0 0 0 0 2     Review of Systems  10 systems reviewed and found to be negative, except as noted in the HPI.   Allergies  Augmentin and Sulfa  antibiotics  Home Medications   Prior to Admission medications   Medication Sig Start Date End Date Taking? Authorizing Provider  calcium carbonate (OS-CAL - DOSED IN MG OF ELEMENTAL CALCIUM) 1250 MG tablet Take 1 tablet by mouth daily.   Yes Historical Provider, MD  Glucosamine HCl (GLUCOSAMINE PO) Take 2 tablets by mouth daily.   Yes Historical Provider, MD   BP 117/68  Pulse 79  Temp(Src) 98.6 F (37 C) (Oral)  Resp 16  SpO2 97%  LMP 03/04/2014 Physical Exam  Nursing note and vitals reviewed. Constitutional: She is oriented to person, place, and time. She appears well-developed and well-nourished. No distress.  HENT:  Head: Normocephalic.  Eyes: Conjunctivae and EOM are normal.  Cardiovascular: Normal rate and intact distal pulses.   Pulmonary/Chest: Effort normal. No stridor.  Musculoskeletal: Normal range of motion.  Shoulder with no deformity. FROM to shoulder and elbow. No TTP of rotator cuff musculature. Drop arm negative. Neurovascularly intact.    Neurological: She is alert and oriented to person, place, and time.  Psychiatric: She has a normal mood and affect.    ED Course  Procedures (including critical care time) Labs Review Labs Reviewed - No data to display  Imaging Review Dg Shoulder Right  03/28/2014   CLINICAL DATA:  Shoulder injury  EXAM:  RIGHT SHOULDER - 2+ VIEW  COMPARISON:  11/06/2010  FINDINGS: Three views of the right shoulder submitted. No acute fracture or subluxation. Glenohumeral joint is preserved.  IMPRESSION: Negative.   Electronically Signed   By: Natasha MeadLiviu  Pop M.D.   On: 03/28/2014 10:11     EKG Interpretation None      MDM   Final diagnoses:  Right shoulder pain    Filed Vitals:   03/28/14 0941 03/28/14 1057  BP: 117/68   Pulse: 79 71  Temp: 98.6 F (37 C)   TempSrc: Oral   Resp: 16   SpO2: 97% 100%    Tanya Terry is a 28 y.o. female presenting with right shoulder pain after patient dislocated it twice the last 24  hours. Patient reduced it herself. She is neurovascularly intact. Advised patient to follow closely with ortho. Patient given sling and medication to go home with. No narcotics given in ED she is driving. Patient declines Toradol.  Evaluation does not show pathology that would require ongoing emergent intervention or inpatient treatment. Pt is hemodynamically stable and mentating appropriately. Discussed findings and plan with patient/guardian, who agrees with care plan. All questions answered. Return precautions discussed and outpatient follow up given.   Discharge Medication List as of 03/28/2014 10:36 AM    START taking these medications   Details  oxyCODONE-acetaminophen (PERCOCET/ROXICET) 5-325 MG per tablet 1 to 2 tabs PO q6hrs  PRN for pain, Print        Note: Portions of this report may have been transcribed using voice recognition software. Every effort was made to ensure accuracy; however, inadvertent computerized transcription errors may be present     Tanya Emeryicole Margrett Kalb, PA-C 03/28/14 1102

## 2014-03-28 NOTE — Discharge Instructions (Signed)
Please follow with your primary care doctor in the next 2 days for a check-up. They must obtain records for further management.  ° °Do not hesitate to return to the Emergency Department for any new, worsening or concerning symptoms.  ° °Take percocet for breakthrough pain, do not drink alcohol, drive, care for children or do other critical tasks while taking percocet. ° ° °

## 2014-03-29 NOTE — ED Provider Notes (Signed)
History/physical exam/procedure(s) were performed by non-physician practitioner and as supervising physician I was immediately available for consultation/collaboration. I have reviewed all notes and am in agreement with care and plan.   Hilario Quarry, MD 03/29/14 [phone]

## 2014-06-27 ENCOUNTER — Emergency Department (HOSPITAL_COMMUNITY)
Admission: EM | Admit: 2014-06-27 | Discharge: 2014-06-27 | Disposition: A | Discharge status: Home / Self Care | Payer: Medicaid Other | Attending: Emergency Medicine | Admitting: Emergency Medicine

## 2014-06-27 ENCOUNTER — Encounter (HOSPITAL_COMMUNITY): Payer: Self-pay | Admitting: Emergency Medicine

## 2014-06-27 DIAGNOSIS — R079 Chest pain, unspecified: Secondary | ICD-10-CM | POA: Diagnosis not present

## 2014-06-27 LAB — BASIC METABOLIC PANEL
ANION GAP: 12 (ref 5–15)
BUN: 9 mg/dL (ref 6–23)
CALCIUM: 9.5 mg/dL (ref 8.4–10.5)
CHLORIDE: 105 meq/L (ref 96–112)
CO2: 26 meq/L (ref 19–32)
Creatinine, Ser: 0.71 mg/dL (ref 0.50–1.10)
GFR calc Af Amer: >90 mL/min (ref >90)
GFR calc non Af Amer: >90 mL/min (ref >90)
Glucose, Bld: 106 mg/dL — ABNORMAL HIGH (ref 70–99)
Potassium: 4 mEq/L (ref 3.7–5.3)
SODIUM: 143 meq/L (ref 137–147)

## 2014-06-27 LAB — I-STAT TROPONIN, ED: TROPONIN I, POC: 0 ng/mL (ref 0.00–0.08)

## 2014-06-27 LAB — CBC
HCT: 41.1 % (ref 36.0–46.0)
Hemoglobin: 14.5 g/dL (ref 12.0–15.0)
MCH: 31.3 pg (ref 26.0–34.0)
MCHC: 35.3 g/dL (ref 30.0–36.0)
MCV: 88.8 fL (ref 78.0–100.0)
Platelets: 272 10*3/uL (ref 150–400)
RBC: 4.63 MIL/uL (ref 3.87–5.11)
RDW: 12 % (ref 11.5–15.5)
WBC: 12.9 10*3/uL — AB (ref 4.0–10.5)

## 2014-06-27 LAB — PRO B NATRIURETIC PEPTIDE: Pro B Natriuretic peptide (BNP): 68.6 pg/mL (ref 0–125)

## 2014-06-27 NOTE — ED Notes (Signed)
Called x 3 without response.

## 2014-06-27 NOTE — ED Notes (Signed)
Per EMS: Pt from home with c/o 8/10 central chest :"squeezing" x 1 week, worse today. Pt reports SOB, nausea, diaphoresis, lightheadedness and weakness. Pain worse with palpation, inspiration and movement. Pt AO x4, NAD.

## 2014-09-09 ENCOUNTER — Encounter (HOSPITAL_COMMUNITY): Payer: Self-pay | Admitting: Emergency Medicine

## 2014-09-16 ENCOUNTER — Ambulatory Visit: Payer: Medicaid Other | Admitting: Obstetrics & Gynecology

## 2014-10-24 ENCOUNTER — Encounter (HOSPITAL_COMMUNITY): Payer: Self-pay | Admitting: Emergency Medicine

## 2014-10-24 ENCOUNTER — Emergency Department (HOSPITAL_COMMUNITY): Payer: Medicaid Other

## 2014-10-24 ENCOUNTER — Emergency Department (HOSPITAL_COMMUNITY)
Admission: EM | Admit: 2014-10-24 | Discharge: 2014-10-24 | Disposition: A | Discharge status: Home / Self Care | Payer: Medicaid Other | Attending: Emergency Medicine | Admitting: Emergency Medicine

## 2014-10-24 DIAGNOSIS — R011 Cardiac murmur, unspecified: Secondary | ICD-10-CM | POA: Insufficient documentation

## 2014-10-24 DIAGNOSIS — Z79899 Other long term (current) drug therapy: Secondary | ICD-10-CM | POA: Insufficient documentation

## 2014-10-24 DIAGNOSIS — Z87891 Personal history of nicotine dependence: Secondary | ICD-10-CM | POA: Insufficient documentation

## 2014-10-24 DIAGNOSIS — Z8711 Personal history of peptic ulcer disease: Secondary | ICD-10-CM | POA: Insufficient documentation

## 2014-10-24 DIAGNOSIS — R079 Chest pain, unspecified: Secondary | ICD-10-CM

## 2014-10-24 DIAGNOSIS — K259 Gastric ulcer, unspecified as acute or chronic, without hemorrhage or perforation: Secondary | ICD-10-CM | POA: Insufficient documentation

## 2014-10-24 LAB — I-STAT TROPONIN, ED: Troponin i, poc: 0 ng/mL (ref 0.00–0.08)

## 2014-10-24 MED ORDER — OMEPRAZOLE 20 MG PO CPDR: 20.0000 mg | DELAYED_RELEASE_CAPSULE | Freq: Every day | ORAL | Status: DC

## 2014-10-24 NOTE — Discharge Instructions (Signed)

## 2014-10-24 NOTE — ED Notes (Signed)
28 yo female c/o chest pain on right and left side of chest. Pt reports pain has been ongoing for about a month but got worse this am. Pain with palpation no radiation. Reports Nausea and back pain but denies vomiting/dizziness. Med Hx of heart murmur and stomach ulcers. Pt tearful at triage. A/O X4

## 2014-10-24 NOTE — ED Provider Notes (Signed)
CSN: [id number]     Arrival date & time 10/24/14  0718 History   First MD Initiated Contact with Patient 10/24/14 [phone]     Chief Complaint  Patient presents with  . Chest Pain     (Consider location/radiation/quality/duration/timing/severity/associated sxs/prior Treatment) Patient is a 28 y.o. female presenting with chest pain. The history is provided by the patient.  Chest Pain Pain location:  L chest Associated symptoms: no abdominal pain, no back pain, no headache, no nausea, no numbness, no shortness of breath, not vomiting and no weakness    patient was a sharp pain in her left chest. Radiates to her right chest. She's had it on and off for the last month. Worse with certain movements. Some nausea. No shortness of breath. It is somewhat worse with deep breaths. No fevers. No trauma. She states it is worse at work when she was moving boxes. She is a former smoker but has not smoked in 6 months. No swelling or legs. She is not on any regulation. No cough. She's a previous history of gastric ulcers.  Past Medical History  Diagnosis Date  . Benign heart murmur   . Ulcer     stomach (no longer taking meds)  . Peptic ulcer    Past Surgical History  Procedure Laterality Date  . Upper gastrointestinal endoscopy    . Wisdom tooth extraction    . Myringotomy      with tubes   Family History  Problem Relation Age of Onset  . Diabetes Mother   . Hypertension Mother   . Heart disease Father   . Hypertension Father   . Anesthesia problems Neg Hx   . Hypotension Neg Hx   . Malignant hyperthermia Neg Hx   . Pseudochol deficiency Neg Hx    History  Substance Use Topics  . Smoking status: Former Smoker    Types: Cigarettes  . Smokeless tobacco: Never Used  . Alcohol Use: No   OB History    Gravida Para Term Preterm AB TAB SAB Ectopic Multiple Living   2 2 2  0 0 0 0 0 0 2     Review of Systems  Constitutional: Negative for activity change and appetite change.  Eyes: Negative  for pain.  Respiratory: Negative for chest tightness and shortness of breath.   Cardiovascular: Positive for chest pain. Negative for leg swelling.  Gastrointestinal: Negative for nausea, vomiting, abdominal pain and diarrhea.  Genitourinary: Negative for flank pain.  Musculoskeletal: Negative for back pain and neck stiffness.  Skin: Negative for rash.  Neurological: Negative for weakness, numbness and headaches.  Psychiatric/Behavioral: Negative for behavioral problems.      Allergies  Augmentin and Sulfa antibiotics  Home Medications   Prior to Admission medications   Medication Sig Start Date End Date Taking? Authorizing Provider  calcium carbonate (OS-CAL - DOSED IN MG OF ELEMENTAL CALCIUM) 1250 MG tablet Take 1 tablet by mouth daily.   Yes Historical Provider, MD  Glucosamine HCl (GLUCOSAMINE PO) Take 2 tablets by mouth daily.   Yes Historical Provider, MD  omeprazole (PRILOSEC) 20 MG capsule Take 1 capsule (20 mg total) by mouth daily. 10/24/14   Juliet RudeNathan R. Arsalan Brisbin, MD   BP 100/57 mmHg  Pulse 62  Temp(Src) 98.2 F (36.8 C) (Oral)  Resp 15  SpO2 100%  LMP 09/25/2014 (Exact Date) Physical Exam  Constitutional: She is oriented to person, place, and time. She appears well-developed and well-nourished.  HENT:  Head: Normocephalic and atraumatic.  Neck: Normal  range of motion. Neck supple.  Cardiovascular: Normal rate, regular rhythm and normal heart sounds.   No murmur heard. Pulmonary/Chest: Effort normal and breath sounds normal. No respiratory distress. She has no wheezes. She has no rales. She exhibits tenderness.  Tenderness to left peristernal area on chest.  Abdominal: Soft. Bowel sounds are normal. She exhibits no distension. There is no tenderness. There is no rebound and no guarding.  Musculoskeletal: Normal range of motion.  Neurological: She is alert and oriented to person, place, and time. No cranial nerve deficit.  Skin: Skin is warm and dry.  Psychiatric:  She has a normal mood and affect. Her speech is normal.  Nursing note and vitals reviewed.   ED Course  Procedures (including critical care time) Labs Review Labs Reviewed  Rosezena Sensor-STAT TROPOININ, ED    Imaging Review Dg Chest 2 View  10/24/2014   CLINICAL DATA:  Chest pain  EXAM: CHEST  2 VIEW  COMPARISON:  None.  FINDINGS: The heart size and mediastinal contours are within normal limits. Both lungs are clear. The visualized skeletal structures are unremarkable.  IMPRESSION: No active cardiopulmonary disease.   Electronically Signed   By: Marlan Palauharles  Clark M.D.   On: 10/24/2014 08:26     EKG Interpretation   Date/Time:  Thursday October 24 2014 07:32:24 EST Ventricular Rate:  72 PR Interval:  154 QRS Duration: 92 QT Interval:  385 QTC Calculation: 421 R Axis:   63 Text Interpretation:  Sinus rhythm Confirmed by Rubin PayorPICKERING  MD, Ovidio Steele  [phone]([id number]) on 10/24/2014 8:05:09 AM      MDM   Final diagnoses:  Chest pain    Patient with chest pain. Low risk. EKG reassuring. Chest x-ray negative. Doubt pulmonary embolism. Will discharge home. May be due to muscular skeletal cause. Will also start PPI    Juliet RudeNathan R. Rubin PayorPickering, MD 10/24/14 [phone]

## 2014-10-24 NOTE — ED Notes (Signed)
MD at bedside. 

## 2014-11-04 ENCOUNTER — Encounter: Payer: Self-pay | Admitting: *Deleted

## 2014-11-05 ENCOUNTER — Encounter: Payer: Self-pay | Admitting: Obstetrics & Gynecology

## 2015-07-23 ENCOUNTER — Telehealth: Payer: Self-pay | Admitting: *Deleted

## 2015-07-23 NOTE — Telephone Encounter (Signed)
Patient is having UTI symptoms and needs an appointment- patient transferred to [facility] to schedule patient.

## 2015-07-25 ENCOUNTER — Ambulatory Visit: Payer: Medicaid Other | Admitting: Certified Nurse Midwife

## 2016-05-17 ENCOUNTER — Encounter (HOSPITAL_COMMUNITY): Payer: Self-pay | Admitting: Emergency Medicine

## 2016-05-17 ENCOUNTER — Emergency Department (HOSPITAL_COMMUNITY): Payer: 59

## 2016-05-17 ENCOUNTER — Emergency Department (HOSPITAL_BASED_OUTPATIENT_CLINIC_OR_DEPARTMENT_OTHER)
Admit: 2016-05-17 | Discharge: 2016-05-17 | Disposition: A | Discharge status: Home / Self Care | Payer: 59 | Attending: Emergency Medicine | Admitting: Emergency Medicine

## 2016-05-17 ENCOUNTER — Emergency Department (HOSPITAL_COMMUNITY)
Admission: EM | Admit: 2016-05-17 | Discharge: 2016-05-17 | Disposition: A | Discharge status: Home / Self Care | Payer: 59 | Attending: Emergency Medicine | Admitting: Emergency Medicine

## 2016-05-17 DIAGNOSIS — M79609 Pain in unspecified limb: Secondary | ICD-10-CM

## 2016-05-17 DIAGNOSIS — M25561 Pain in right knee: Secondary | ICD-10-CM | POA: Diagnosis present

## 2016-05-17 DIAGNOSIS — Z9622 Myringotomy tube(s) status: Secondary | ICD-10-CM | POA: Diagnosis not present

## 2016-05-17 DIAGNOSIS — Z87891 Personal history of nicotine dependence: Secondary | ICD-10-CM | POA: Diagnosis not present

## 2016-05-17 DIAGNOSIS — Z79899 Other long term (current) drug therapy: Secondary | ICD-10-CM | POA: Diagnosis not present

## 2016-05-17 MED ORDER — HYDROCODONE-ACETAMINOPHEN 5-325 MG PO TABS
1.0000 | ORAL_TABLET | Freq: Once | ORAL | Status: AC
  Administered 2016-05-17: 1 via ORAL
  Filled 2016-05-17: qty 1

## 2016-05-17 MED ORDER — IBUPROFEN 800 MG PO TABS: 800.0000 mg | ORAL_TABLET | Freq: Three times a day (TID) | ORAL | Status: DC

## 2016-05-17 MED ORDER — KETOROLAC TROMETHAMINE 60 MG/2ML IM SOLN: 60.0000 mg | Freq: Once | INTRAMUSCULAR | Status: DC

## 2016-05-17 NOTE — ED Notes (Signed)
Pt c/o right knee pain after "twisting it at work" today. Pt states she is unable to bend the knee.

## 2016-05-17 NOTE — Progress Notes (Signed)
VASCULAR LAB PRELIMINARY  PRELIMINARY  PRELIMINARY  PRELIMINARY  Right lower extremity venous duplex completed.    Preliminary report:  There is no DVT or SVT noted in the right lower extremity.   Aarika Moon, RVT 05/17/2016, 7:01 PM

## 2016-05-17 NOTE — Discharge Instructions (Signed)

## 2016-05-17 NOTE — ED Provider Notes (Signed)
CSN: [id number]     Arrival date & time 05/17/16  1534 History  By signing my name below, I, Tanya Terry, attest that this documentation has been prepared under the direction and in the presence of Altariq Goodall, PA-C. Electronically Signed: Tanda RockersMargaux Terry, ED Scribe. 05/17/2016. 4:42 PM.   Chief Complaint  Patient presents with  . Knee Pain   The history is provided by the patient. No language interpreter was used.   HPI Comments: Tanya Terry is a 30 y.o. female who presents to the Emergency Department complaining of gradual onset, constant, right knee pain x 1 week. The knee pain is around her knee cap and posterior. She also complains of swelling to the knee and right calf pain. Denies redness or warmth of the knee or calf. No known injury to the knee. The pain is exacerbated with bending the knee, straightening of the knee, and ambulating. She states that she has to put most of her weight on her left leg and walk on her toes to alleviate the pain. She has been taking Aleve without relief. She mentions that she fell today while walking down some steps due to trying not to bend her knee. Pt decided to come to the ED after the fall because she cannot deal with the knee pain any longer. No head injury or LOC. No hx or fhx DVT/PE. No estrogen intake. No recent prolonged travel. No recent surgery. Denies CP or SOB. Denies weakness, numbness, tingling, calf swelling, or any other associated symptoms.   Past Medical History  Diagnosis Date  . Benign heart murmur   . Ulcer     stomach (no longer taking meds)  . Peptic ulcer    Past Surgical History  Procedure Laterality Date  . Upper gastrointestinal endoscopy    . Wisdom tooth extraction    . Myringotomy      with tubes   Family History  Problem Relation Age of Onset  . Diabetes Mother   . Hypertension Mother   . Heart disease Father   . Hypertension Father   . Anesthesia problems Neg Hx   . Hypotension Neg Hx   . Malignant  hyperthermia Neg Hx   . Pseudochol deficiency Neg Hx    Social History  Substance Use Topics  . Smoking status: Former Smoker    Types: Cigarettes  . Smokeless tobacco: Never Used  . Alcohol Use: No   OB History    Gravida Para Term Preterm AB TAB SAB Ectopic Multiple Living   2 2 2  0 0 0 0 0 0 2     Review of Systems  Musculoskeletal: Positive for joint swelling and arthralgias.  Neurological: Negative for weakness and numbness.  All other systems reviewed and are negative.  Allergies  Augmentin and Sulfa antibiotics  Home Medications   Prior to Admission medications   Medication Sig Start Date End Date Taking? Authorizing Provider  calcium carbonate (OS-CAL - DOSED IN MG OF ELEMENTAL CALCIUM) 1250 MG tablet Take 1 tablet by mouth daily.    Historical Provider, MD  Glucosamine HCl (GLUCOSAMINE PO) Take 2 tablets by mouth daily.    Historical Provider, MD  omeprazole (PRILOSEC) 20 MG capsule Take 1 capsule (20 mg total) by mouth daily. 10/24/14   Benjiman CoreNathan Pickering, MD   BP 130/72 mmHg  Pulse 56  Temp(Src) 98.8 F (37.1 C) (Oral)  Resp 18  SpO2 100%  LMP 05/14/2016   Physical Exam  Constitutional: She appears well-developed and well-nourished. No  distress.  HENT:  Head: Normocephalic and atraumatic.  Right Ear: External ear normal.  Left Ear: External ear normal.  Eyes: Conjunctivae are normal. Right eye exhibits no discharge. Left eye exhibits no discharge. No scleral icterus.  Neck: Normal range of motion.  Cardiovascular: Normal rate and intact distal pulses.   Pedal pulse palpable  Pulmonary/Chest: Effort normal.  Musculoskeletal:       Right knee: She exhibits decreased range of motion. She exhibits no swelling, no effusion, no deformity, no erythema, no LCL laxity, normal patellar mobility and no MCL laxity. Tenderness found.  TTP over the patella and popliteal fossa. No obvious swelling or deformity. No erythema or warmth. Right knee ROM restricted secondary  to pain. Full passive ROM intact though pt states this is painful. No ligamentous laxity. Negative anterior drawer.  Right posterior calf TTP. No obvious unilateral calf swelling, erythema or palpable cords. Mildly positive Homan's sign on the right but pt endorses pain with any manipulation of right lower extremity.   Neurological: She is alert. Coordination normal.  5/5 strength of BLE. Sensation to light touch intact throughout.   Skin: Skin is warm and dry. No erythema.  Psychiatric: She has a normal mood and affect. Her behavior is normal.  Nursing note and vitals reviewed.   ED Course  Procedures (including critical care time)  DIAGNOSTIC STUDIES: Oxygen Saturation is 98% on RA, normal by my interpretation.    COORDINATION OF CARE: 4:41 PM-Discussed treatment plan which includes doppler ultrasound with pt at bedside and pt agreed to plan.   Labs Review Labs Reviewed - No data to display  Imaging Review Dg Knee Complete 4 Views Right  05/17/2016  CLINICAL DATA:  Twisting injury right knee at work today. Limited range of motion. EXAM: RIGHT KNEE - COMPLETE 4+ VIEW COMPARISON:  Plain films right knee 05/10/2005. FINDINGS: No evidence of fracture, dislocation, or joint effusion. No evidence of arthropathy or other focal bone abnormality. Soft tissues are unremarkable. IMPRESSION: Negative exam. Electronically Signed   By: Drusilla Kanner M.D.   On: 05/17/2016 16:15   I have personally reviewed and evaluated these images and lab results as part of my medical decision-making.   EKG Interpretation None      MDM   Final diagnoses:  Right knee pain   Patient presenting with atraumatic right knee pain x 1 week. Right lower extremity is neurovascularly intact. Restricted active ROM secondary to pain. No knee swelling, erythema or warmth suggesting septic joint. Tenderness to palpation of the right calf. No unilateral calf swelling. Patient X-Ray negative for obvious fracture or  dislocation. Venous US negative. Brace and crutches given and conservative therapy recommended. Discussed RICE therapy and use of OTC pain relievers. Pt advised to follow up with orthopedics if symptoms persist. Return precautions discussed at bedside and given in discharge paperwork. Pt is stable for discharge.  I personally performed the services described in this documentation, which was scribed in my presence. The recorded information has been reviewed and is accurate.      Alveta Heimlich, PA-C 05/17/16 1922  Maia Plan, MD 05/17/16 [phone]

## 2016-05-21 ENCOUNTER — Telehealth: Payer: Self-pay | Admitting: *Deleted

## 2016-07-12 ENCOUNTER — Encounter (HOSPITAL_COMMUNITY): Payer: Self-pay | Admitting: *Deleted

## 2016-07-12 ENCOUNTER — Inpatient Hospital Stay (HOSPITAL_COMMUNITY)
Admission: AD | Admit: 2016-07-12 | Discharge: 2016-07-12 | Disposition: A | Discharge status: Home / Self Care | Payer: Medicaid Other | Source: Ambulatory Visit | Attending: Obstetrics & Gynecology | Admitting: Obstetrics & Gynecology

## 2016-07-12 ENCOUNTER — Inpatient Hospital Stay (HOSPITAL_COMMUNITY): Payer: Medicaid Other

## 2016-07-12 DIAGNOSIS — Z3A09 9 weeks gestation of pregnancy: Secondary | ICD-10-CM | POA: Diagnosis not present

## 2016-07-12 DIAGNOSIS — O3680X0 Pregnancy with inconclusive fetal viability, not applicable or unspecified: Secondary | ICD-10-CM

## 2016-07-12 DIAGNOSIS — O9989 Other specified diseases and conditions complicating pregnancy, childbirth and the puerperium: Secondary | ICD-10-CM | POA: Diagnosis not present

## 2016-07-12 DIAGNOSIS — R102 Pelvic and perineal pain unspecified side: Secondary | ICD-10-CM

## 2016-07-12 DIAGNOSIS — O26899 Other specified pregnancy related conditions, unspecified trimester: Secondary | ICD-10-CM

## 2016-07-12 DIAGNOSIS — R109 Unspecified abdominal pain: Secondary | ICD-10-CM | POA: Diagnosis present

## 2016-07-12 DIAGNOSIS — O26891 Other specified pregnancy related conditions, first trimester: Secondary | ICD-10-CM | POA: Insufficient documentation

## 2016-07-12 LAB — URINALYSIS, ROUTINE W REFLEX MICROSCOPIC
BILIRUBIN URINE: NEGATIVE
Glucose, UA: NEGATIVE mg/dL
Ketones, ur: 15 mg/dL — AB
NITRITE: NEGATIVE
PH: 6 (ref 5.0–8.0)
Protein, ur: NEGATIVE mg/dL

## 2016-07-12 LAB — HCG, QUANTITATIVE, PREGNANCY: hCG, Beta Chain, Quant, S: 7831 m[IU]/mL — ABNORMAL HIGH (ref <5)

## 2016-07-12 LAB — WET PREP, GENITAL
Clue Cells Wet Prep HPF POC: NONE SEEN
Sperm: NONE SEEN
Trich, Wet Prep: NONE SEEN
YEAST WET PREP: NONE SEEN

## 2016-07-12 LAB — URINE MICROSCOPIC-ADD ON

## 2016-07-12 LAB — POCT PREGNANCY, URINE: Preg Test, Ur: POSITIVE — AB

## 2016-07-12 NOTE — MAU Provider Note (Signed)
History     CSN: [id number]  Arrival date and time: 07/12/16 1330   First Provider Initiated Contact with Patient 07/12/16 1406      Chief Complaint  Patient presents with  . Abdominal Pain   [id number] @[redacted]w[redacted]d  by LMP c/o LAP x3 days. She reports the pain is crampy, intermittent, and across the lower abdomen. She took Tylenol and used a heating pad and had some relief. She reports worsening of the pain today. She denies VB and vaginal discharge. She took a pregnancy test 2 weeks ago and again 2 days ago and all were negative.    OB History    Gravida Para Term Preterm AB Living   3 2 2  0 0 2   SAB TAB Ectopic Multiple Live Births   0 0 0 0 1      Past Medical History:  Diagnosis Date  . Benign heart murmur   . Peptic ulcer   . Ulcer    stomach (no longer taking meds)    Past Surgical History:  Procedure Laterality Date  . MYRINGOTOMY     with tubes  . UPPER GASTROINTESTINAL ENDOSCOPY    . WISDOM TOOTH EXTRACTION      Family History  Problem Relation Age of Onset  . Diabetes Mother   . Hypertension Mother   . Heart disease Father   . Hypertension Father   . Anesthesia problems Neg Hx   . Hypotension Neg Hx   . Malignant hyperthermia Neg Hx   . Pseudochol deficiency Neg Hx     Social History  Substance Use Topics  . Smoking status: Former Smoker    Types: Cigarettes  . Smokeless tobacco: Never Used  . Alcohol use No    Allergies:  Allergies  Allergen Reactions  . Augmentin [Amoxicillin-Pot Clavulanate] Anaphylaxis, Shortness Of Breath and Swelling  . Sulfa Antibiotics Anaphylaxis, Shortness Of Breath and Swelling    Prescriptions Prior to Admission  Medication Sig Dispense Refill Last Dose  . aspirin EC 81 MG tablet Take 81 mg by mouth daily.   07/11/2016 at Unknown time  . ibuprofen (ADVIL,MOTRIN) 800 MG tablet Take 1 tablet (800 mg total) by mouth 3 (three) times daily. (Patient not taking: Reported on 07/12/2016) 21 tablet 0 Not Taking at Unknown time  .  omeprazole (PRILOSEC) 20 MG capsule Take 1 capsule (20 mg total) by mouth daily. (Patient not taking: Reported on 07/12/2016) 14 capsule 0 Not Taking at Unknown time    Review of Systems  Constitutional: Negative.   Gastrointestinal: Positive for abdominal pain and nausea. Negative for vomiting.  Genitourinary: Negative.    Physical Exam   Blood pressure 114/70, pulse 80, temperature 98.4 F (36.9 C), temperature source Oral, resp. rate 18, height 5\' 7"  (1.702 m), weight 118.7 kg (261 lb 9.6 oz), last menstrual period 05/05/2016, SpO2 100 %.  Physical Exam  Constitutional: She is oriented to person, place, and time. She appears well-developed and well-nourished.  HENT:  Head: Normocephalic and atraumatic.  Neck: Normal range of motion.  Cardiovascular: Normal rate.   Respiratory: Effort normal.  GI: Soft. She exhibits no distension and no mass. There is tenderness (suprapubic). There is no rebound and no guarding.  Genitourinary:  Genitourinary Comments: External: no lesions Vagina: rugated, parous, thin, white discharge Uterus: non enlarged, anteverted, mildly tender, no CMT Adnexae: no masses, mild tenderness left, no tenderness right   Musculoskeletal: Normal range of motion.  Neurological: She is alert and oriented to person, place, and time.  Skin: Skin is warm and dry.  Psychiatric: She has a normal mood and affect.   Results for orders placed or performed during the hospital encounter of 07/12/16 (from the past 24 hour(s))  Urinalysis, Routine w reflex microscopic (not at [facility])     Status: Abnormal   Collection Time: 07/12/16  1:40 PM  Result Value Ref Range   Color, Urine YELLOW YELLOW   APPearance CLOUDY (A) CLEAR   Specific Gravity, Urine >1.030 (H) 1.005 - 1.030   pH 6.0 5.0 - 8.0   Glucose, UA NEGATIVE NEGATIVE mg/dL   Hgb urine dipstick TRACE (A) NEGATIVE   Bilirubin Urine NEGATIVE NEGATIVE   Ketones, ur 15 (A) NEGATIVE mg/dL   Protein, ur NEGATIVE NEGATIVE  mg/dL   Nitrite NEGATIVE NEGATIVE   Leukocytes, UA LARGE (A) NEGATIVE  Urine microscopic-add on     Status: Abnormal   Collection Time: 07/12/16  1:40 PM  Result Value Ref Range   Squamous Epithelial / LPF 6-30 (A) NONE SEEN   WBC, UA 6-30 0 - 5 WBC/hpf   RBC / HPF 0-5 0 - 5 RBC/hpf   Bacteria, UA MANY (A) NONE SEEN  Pregnancy, urine POC     Status: Abnormal   Collection Time: 07/12/16  1:49 PM  Result Value Ref Range   Preg Test, Ur POSITIVE (A) NEGATIVE  Wet prep, genital     Status: Abnormal   Collection Time: 07/12/16  2:15 PM  Result Value Ref Range   Yeast Wet Prep HPF POC NONE SEEN NONE SEEN   Trich, Wet Prep NONE SEEN NONE SEEN   Clue Cells Wet Prep HPF POC NONE SEEN NONE SEEN   WBC, Wet Prep HPF POC MANY (A) NONE SEEN   Sperm NONE SEEN   hCG, quantitative, pregnancy     Status: Abnormal   Collection Time: 07/12/16  2:22 PM  Result Value Ref Range   hCG, Beta Chain, Quant, S 7,831 (H) <5 mIU/mL   US Ob Comp Less 14 Wks  Result Date: 07/12/2016 CLINICAL DATA:  Pelvic pain, left greater than right. Quantitative beta HCG is pending. LMP was06/28/2017. Gestational age by LMP is9 weeks 5 days. EDC by LMP is04/02/2017. EXAM: OBSTETRIC <14 WK [place] AND TRANSVAGINAL OB US TECHNIQUE: Both transabdominal and transvaginal ultrasound examinations were performed for complete evaluation of the gestation as well as the maternal uterus, adnexal regions, and pelvic cul-de-sac. Transvaginal technique was performed to assess early pregnancy. COMPARISON:  None applicable FINDINGS: Intrauterine gestational sac: Present Yolk sac:  Possible Embryo:  Not seen Cardiac Activity: Not seen Heart Rate: Absent  bpm MSD: 6.3  mm   5 w   2  d Subchorionic hemorrhage:  None visualized. Maternal uterus/adnexae: Left corpus luteum cyst is present and measures 3.2 cm. Right ovary has normal appearance. No free pelvic fluid. IMPRESSION: 1. Intrauterine gestational sac is present. Possible yolk sac is present. Embryo is  not yet visualized. Follow-up ultrasound is recommended in 14 or more days to document presence of fetal pole and for dating purposes. 2. Clinical and ultrasound dating differ. 3. 3.2 cm left corpus luteum cyst. Electronically Signed   By: Norva Pavlov M.D.   On: 07/12/2016 15:37   US Ob Transvaginal  Result Date: 07/12/2016 CLINICAL DATA:  Pelvic pain, left greater than right. Quantitative beta HCG is pending. LMP was06/28/2017. Gestational age by LMP is9 weeks 5 days. EDC by LMP is04/02/2017. EXAM: OBSTETRIC <14 WK [place] AND TRANSVAGINAL OB US TECHNIQUE: Both transabdominal and transvaginal  ultrasound examinations were performed for complete evaluation of the gestation as well as the maternal uterus, adnexal regions, and pelvic cul-de-sac. Transvaginal technique was performed to assess early pregnancy. COMPARISON:  None applicable FINDINGS: Intrauterine gestational sac: Present Yolk sac:  Possible Embryo:  Not seen Cardiac Activity: Not seen Heart Rate: Absent  bpm MSD: 6.3  mm   5 w   2  d Subchorionic hemorrhage:  None visualized. Maternal uterus/adnexae: Left corpus luteum cyst is present and measures 3.2 cm. Right ovary has normal appearance. No free pelvic fluid. IMPRESSION: 1. Intrauterine gestational sac is present. Possible yolk sac is present. Embryo is not yet visualized. Follow-up ultrasound is recommended in 14 or more days to document presence of fetal pole and for dating purposes. 2. Clinical and ultrasound dating differ. 3. 3.2 cm left corpus luteum cyst. Electronically Signed   By: Norva PavlovElizabeth  Brown M.D.   On: 07/12/2016 15:37   MAU Course  Procedures  MDM Labs and US ordered and reviewed. Pain likely physiologic to early pregnancy and/or CL cyst. Cannot r/o ectopic although unlikely. Stable for discharge home.   Assessment and Plan  Pregnancy with unknown anatomic location Pelvic pain in pregnancy  Discharge home Tylenol, heating pad prn discomfort Follow up in 2 days for quant  HCG Follow up in 1 week for rpt US Ectopic precautions discussed  Donette LarryMelanie Hester Joslin, CNM 07/12/2016, 2:17 PM

## 2016-07-12 NOTE — Discharge Instructions (Signed)

## 2016-07-12 NOTE — MAU Note (Signed)
Patient has had lower abdominal cramping for past few days rating it a 7/10.  Has tried tylenol for pain with no relief.  LMP was 05/05/16.  Patient has regular periods.  Took negative HPT on Thursday.  Reports clear vaginal discharge with no pain or itching.

## 2016-07-13 LAB — CULTURE, OB URINE

## 2016-07-13 LAB — GC/CHLAMYDIA PROBE AMP (~~LOC~~) NOT AT ARMC
CHLAMYDIA, DNA PROBE: NEGATIVE
Neisseria Gonorrhea: NEGATIVE

## 2016-07-14 ENCOUNTER — Other Ambulatory Visit: Payer: Self-pay | Admitting: General Practice

## 2016-07-14 DIAGNOSIS — O3680X0 Pregnancy with inconclusive fetal viability, not applicable or unspecified: Secondary | ICD-10-CM

## 2016-07-14 LAB — HCG, QUANTITATIVE, PREGNANCY: HCG, BETA CHAIN, QUANT, S: 14882 m[IU]/mL — AB (ref <5)

## 2016-07-14 NOTE — Progress Notes (Signed)
Patient here for stat bhcg today. Patient reports mild cramping but denies bleeding. Reviewed patient's history/labs with Dr Macon LargeAnyanwu who agrees to favorable rise in bhcg & need for follow up ultrasound in one week. Scheduled for 9/13 @ 3pm with brief office appt to follow for results. Informed patient of results & appts. Patient verbalized understanding to all & had no questions

## 2016-07-21 ENCOUNTER — Ambulatory Visit (HOSPITAL_COMMUNITY): Payer: 59

## 2016-07-26 ENCOUNTER — Encounter (HOSPITAL_COMMUNITY): Payer: Self-pay

## 2016-07-26 ENCOUNTER — Telehealth: Payer: Self-pay | Admitting: *Deleted

## 2016-07-26 ENCOUNTER — Inpatient Hospital Stay (HOSPITAL_COMMUNITY)
Admission: AD | Admit: 2016-07-26 | Discharge: 2016-07-26 | Disposition: A | Discharge status: Home / Self Care | Payer: Medicaid Other | Source: Ambulatory Visit | Attending: Family Medicine | Admitting: Family Medicine

## 2016-07-26 DIAGNOSIS — Z880 Allergy status to penicillin: Secondary | ICD-10-CM | POA: Diagnosis not present

## 2016-07-26 DIAGNOSIS — R51 Headache: Secondary | ICD-10-CM | POA: Insufficient documentation

## 2016-07-26 DIAGNOSIS — Z3A01 Less than 8 weeks gestation of pregnancy: Secondary | ICD-10-CM | POA: Diagnosis not present

## 2016-07-26 DIAGNOSIS — Z87891 Personal history of nicotine dependence: Secondary | ICD-10-CM | POA: Diagnosis not present

## 2016-07-26 DIAGNOSIS — O26891 Other specified pregnancy related conditions, first trimester: Secondary | ICD-10-CM

## 2016-07-26 DIAGNOSIS — O219 Vomiting of pregnancy, unspecified: Secondary | ICD-10-CM

## 2016-07-26 DIAGNOSIS — O210 Mild hyperemesis gravidarum: Secondary | ICD-10-CM | POA: Diagnosis present

## 2016-07-26 LAB — URINALYSIS, ROUTINE W REFLEX MICROSCOPIC
Bilirubin Urine: NEGATIVE
Glucose, UA: NEGATIVE mg/dL
Hgb urine dipstick: NEGATIVE
Ketones, ur: NEGATIVE mg/dL
Nitrite: NEGATIVE
Protein, ur: NEGATIVE mg/dL
Specific Gravity, Urine: <1.005 — ABNORMAL LOW (ref 1.005–1.030)
pH: 6 (ref 5.0–8.0)

## 2016-07-26 LAB — URINE MICROSCOPIC-ADD ON

## 2016-07-26 MED ORDER — METOCLOPRAMIDE HCL 10 MG PO TABS: 10.0000 mg | ORAL_TABLET | Freq: Four times a day (QID) | ORAL | 0 refills | Status: DC

## 2016-07-26 MED ORDER — ACETAMINOPHEN 500 MG PO TABS
1000.0000 mg | ORAL_TABLET | Freq: Once | ORAL | Status: AC
  Administered 2016-07-26: 1000 mg via ORAL
  Filled 2016-07-26: qty 2

## 2016-07-26 MED ORDER — PROMETHAZINE HCL 25 MG PO TABS
25.0000 mg | ORAL_TABLET | Freq: Once | ORAL | Status: AC
  Administered 2016-07-26: 25 mg via ORAL
  Filled 2016-07-26: qty 1

## 2016-07-26 MED ORDER — PROMETHAZINE HCL 25 MG PO TABS: 25.0000 mg | ORAL_TABLET | Freq: Four times a day (QID) | ORAL | 0 refills | Status: DC | PRN

## 2016-07-26 NOTE — MAU Provider Note (Signed)
History     CSN: [id number]  Arrival date and time: 07/26/16 1506   First Provider Initiated Contact with Patient 07/26/16 1644      Chief Complaint  Patient presents with  . Morning Sickness  . Headache   HPI Tanya Terry is a 30 y.o. G3P2002 at [redacted]w[redacted]d who presents with n/v & headache. Reports nausea throughout the pregnancy but vomited yesterday and today. Vomited 4 times today; the last time within an hour of arrival to MAU. Does not have antiemetic. Also reports headache since this morning. Describes as constant & throbbing. Rates 6/10. Has not treated.  Denies diarrhea, constipation, abdominal pain, dysuria, vaginal bleeding, vaginal discharge, or fever. Has initial ob scheduled with [facility].   OB History    Gravida Para Term Preterm AB Living   3 2 2  0 0 2   SAB TAB Ectopic Multiple Live Births   0 0 0 0 2      Past Medical History:  Diagnosis Date  . Benign heart murmur   . Peptic ulcer   . Ulcer    stomach (no longer taking meds)    Past Surgical History:  Procedure Laterality Date  . MYRINGOTOMY     with tubes  . UPPER GASTROINTESTINAL ENDOSCOPY    . WISDOM TOOTH EXTRACTION      Family History  Problem Relation Age of Onset  . Diabetes Mother   . Hypertension Mother   . Heart disease Father   . Hypertension Father   . Anesthesia problems Neg Hx   . Hypotension Neg Hx   . Malignant hyperthermia Neg Hx   . Pseudochol deficiency Neg Hx     Social History  Substance Use Topics  . Smoking status: Former Smoker    Types: Cigarettes  . Smokeless tobacco: Never Used  . Alcohol use No    Allergies:  Allergies  Allergen Reactions  . Augmentin [Amoxicillin-Pot Clavulanate] Anaphylaxis, Shortness Of Breath and Swelling  . Sulfa Antibiotics Anaphylaxis, Shortness Of Breath and Swelling    No prescriptions prior to admission.    Review of Systems  Constitutional: Negative.   Gastrointestinal: Positive for nausea and vomiting. Negative for  abdominal pain, constipation and diarrhea.  Genitourinary: Negative.   Neurological: Positive for headaches.   Physical Exam   Blood pressure 122/66, pulse 79, temperature 98.3 F (36.8 C), temperature source Oral, resp. rate 17, height 5\' 7"  (1.702 m), weight 258 lb (117 kg), last menstrual period 05/05/2016.  Physical Exam  Nursing note and vitals reviewed. Constitutional: She is oriented to person, place, and time. She appears well-developed and well-nourished. No distress.  HENT:  Head: Normocephalic and atraumatic.  Eyes: Conjunctivae are normal. Right eye exhibits no discharge. Left eye exhibits no discharge. No scleral icterus.  Neck: Normal range of motion.  Cardiovascular: Normal rate and normal heart sounds.   Respiratory: Effort normal. No respiratory distress.  GI: Soft. There is no tenderness. There is no rebound.  Neurological: She is alert and oriented to person, place, and time.  Skin: Skin is warm and dry. She is not diaphoretic.  Psychiatric: She has a normal mood and affect. Her behavior is normal. Judgment and thought content normal.    MAU Course  Procedures Results for orders placed or performed during the hospital encounter of 07/26/16 (from the past 24 hour(s))  Urinalysis, Routine w reflex microscopic (not at [facility])     Status: Abnormal   Collection Time: 07/26/16  4:40 PM  Result Value Ref Range  Color, Urine YELLOW YELLOW   APPearance CLEAR CLEAR   Specific Gravity, Urine <1.005 (L) 1.005 - 1.030   pH 6.0 5.0 - 8.0   Glucose, UA NEGATIVE NEGATIVE mg/dL   Hgb urine dipstick NEGATIVE NEGATIVE   Bilirubin Urine NEGATIVE NEGATIVE   Ketones, ur NEGATIVE NEGATIVE mg/dL   Protein, ur NEGATIVE NEGATIVE mg/dL   Nitrite NEGATIVE NEGATIVE   Leukocytes, UA MODERATE (A) NEGATIVE  Urine microscopic-add on     Status: Abnormal   Collection Time: 07/26/16  4:40 PM  Result Value Ref Range   Squamous Epithelial / LPF 0-5 (A) NONE SEEN   WBC, UA 0-5 0 - 5 WBC/hpf    RBC / HPF 0-5 0 - 5 RBC/hpf   Bacteria, UA RARE (A) NONE SEEN    MDM U/a - no evidence of dehydration at this time -- moderate leuks, will send for urine culture Phenergan 25 mg PO Tylenol 1 gm PO Pt reports improvement in symptoms  Assessment and Plan  A:  1. Nausea and vomiting during pregnancy prior to [redacted] weeks gestation   2. Headache in pregnancy, first trimester     P: Discharge home Take tylenol prn headaches Rx phenergan & reglan (do NOT take together) Discussed reasons to return to MAU Urine culture pending [facility]  Judeth HornErin Willadeen Colantuono 07/26/2016, 4:43 PM

## 2016-07-26 NOTE — MAU Note (Signed)
Pt states she has had a headache this morning.  Then she started vomiting, about 4 times today.  Has not taken anything for the headache.  Lower abdominal cramping, no vb.

## 2016-07-26 NOTE — Discharge Instructions (Signed)
General Headache Without Cause A headache is pain or discomfort felt around the head or neck area. The specific cause of a headache may not be found. There are many causes and types of headaches. A few common ones are:  Tension headaches.  Migraine headaches.  Cluster headaches.  Chronic daily headaches. HOME CARE INSTRUCTIONS  Watch your condition for any changes. Take these steps to help with your condition: Managing Pain  Take over-the-counter and prescription medicines only as told by your health care provider.  Lie down in a dark, quiet room when you have a headache.  If directed, apply ice to the head and neck area:  Put ice in a plastic bag.  Place a towel between your skin and the bag.  Leave the ice on for 20 minutes, 2-3 times per day.  Use a heating pad or hot shower to apply heat to the head and neck area as told by your health care provider.  Keep lights dim if bright lights bother you or make your headaches worse. Eating and Drinking  Eat meals on a regular schedule.  Limit alcohol use.  Decrease the amount of caffeine you drink, or stop drinking caffeine. General Instructions  Keep all follow-up visits as told by your health care provider. This is important.  Keep a headache journal to help find out what may trigger your headaches. For example, write down:  What you eat and drink.  How much sleep you get.  Any change to your diet or medicines.  Try massage or other relaxation techniques.  Limit stress.  Sit up straight, and do not tense your muscles.  Do not use tobacco products, including cigarettes, chewing tobacco, or e-cigarettes. If you need help quitting, ask your health care provider.  Exercise regularly as told by your health care provider.  Sleep on a regular schedule. Get 7-9 hours of sleep, or the amount recommended by your health care provider. SEEK MEDICAL CARE IF:   Your symptoms are not helped by medicine.  You have a  headache that is different from the usual headache.  You have nausea or you vomit.  You have a fever. SEEK IMMEDIATE MEDICAL CARE IF:   Your headache becomes severe.  You have repeated vomiting.  You have a stiff neck.  You have a loss of vision.  You have problems with speech.  You have pain in the eye or ear.  You have muscular weakness or loss of muscle control.  You lose your balance or have trouble walking.  You feel faint or pass out.  You have confusion.   This information is not intended to replace advice given to you by your health care provider. Make sure you discuss any questions you have with your health care provider.   Document Released: 10/25/2005 Document Revised: 07/16/2015 Document Reviewed: 02/17/2015 Elsevier Interactive Patient Education 2016 Elsevier Inc. Morning Sickness Morning sickness is when you feel sick to your stomach (nauseous) during pregnancy. This nauseous feeling may or may not come with vomiting. It often occurs in the morning but can be a problem any time of day. Morning sickness is most common during the first trimester, but it may continue throughout pregnancy. While morning sickness is unpleasant, it is usually harmless unless you develop severe and continual vomiting (hyperemesis gravidarum). This condition requires more intense treatment.  CAUSES  The cause of morning sickness is not completely known but seems to be related to normal hormonal changes that occur in pregnancy. RISK FACTORS You are  at greater risk if you:  Experienced nausea or vomiting before your pregnancy.  Had morning sickness during a previous pregnancy.  Are pregnant with more than one baby, such as twins. TREATMENT  Do not use any medicines (prescription, over-the-counter, or herbal) for morning sickness without first talking to your health care provider. Your health care provider may prescribe or recommend:  Vitamin B6 supplements.  Anti-nausea  medicines.  The herbal medicine ginger. HOME CARE INSTRUCTIONS   Only take over-the-counter or prescription medicines as directed by your health care provider.  Taking multivitamins before getting pregnant can prevent or decrease the severity of morning sickness in most women.  Eat a piece of dry toast or unsalted crackers before getting out of bed in the morning.  Eat five or six small meals a day.  Eat dry and bland foods (rice, baked potato). Foods high in carbohydrates are often helpful.  Do not drink liquids with your meals. Drink liquids between meals.  Avoid greasy, fatty, and spicy foods.  Get someone to cook for you if the smell of any food causes nausea and vomiting.  If you feel nauseous after taking prenatal vitamins, take the vitamins at night or with a snack.  Snack on protein foods (nuts, yogurt, cheese) between meals if you are hungry.  Eat unsweetened gelatins for desserts.  Wearing an acupressure wristband (worn for sea sickness) may be helpful.  Acupuncture may be helpful.  Do not smoke.  Get a humidifier to keep the air in your house free of odors.  Get plenty of fresh air. SEEK MEDICAL CARE IF:   Your home remedies are not working, and you need medicine.  You feel dizzy or lightheaded.  You are losing weight. SEEK IMMEDIATE MEDICAL CARE IF:   You have persistent and uncontrolled nausea and vomiting.  You pass out (faint). MAKE SURE YOU:  Understand these instructions.  Will watch your condition.  Will get help right away if you are not doing well or get worse.   This information is not intended to replace advice given to you by your health care provider. Make sure you discuss any questions you have with your health care provider.   Document Released: 12/16/2006 Document Revised: 10/30/2013 Document Reviewed: 04/11/2013 Elsevier Interactive Patient Education [employer]2016 Elsevier Inc.

## 2016-07-26 NOTE — Telephone Encounter (Signed)
Patient has some concerns. 1:45 Patient states her first appointment is not until October- she is having awful nausea so bad that she can't keep anything down. She is having headaches and she just doesn't feel good. Advised patient she may need to go to MAU for fluids and anti nausea treatment if she is in a cycle that she is not able to stop. She is at work now and she agrees that she will go.

## 2016-07-27 LAB — CULTURE, OB URINE

## 2016-08-18 ENCOUNTER — Ambulatory Visit (INDEPENDENT_AMBULATORY_CARE_PROVIDER_SITE_OTHER): Payer: Medicaid Other | Admitting: Obstetrics

## 2016-08-18 ENCOUNTER — Encounter: Payer: Self-pay | Admitting: Obstetrics

## 2016-08-18 ENCOUNTER — Other Ambulatory Visit (HOSPITAL_COMMUNITY)
Admission: RE | Admit: 2016-08-18 | Discharge: 2016-08-18 | Disposition: A | Discharge status: Home / Self Care | Payer: Medicaid Other | Source: Ambulatory Visit | Attending: Obstetrics | Admitting: Obstetrics

## 2016-08-18 VITALS — BP 115/78 | HR 89 | Temp 98.8°F | Wt 266.0 lb

## 2016-08-18 DIAGNOSIS — Z01411 Encounter for gynecological examination (general) (routine) with abnormal findings: Secondary | ICD-10-CM | POA: Insufficient documentation

## 2016-08-18 DIAGNOSIS — Z124 Encounter for screening for malignant neoplasm of cervix: Secondary | ICD-10-CM | POA: Diagnosis not present

## 2016-08-18 DIAGNOSIS — Z1151 Encounter for screening for human papillomavirus (HPV): Secondary | ICD-10-CM | POA: Diagnosis present

## 2016-08-18 DIAGNOSIS — Z3491 Encounter for supervision of normal pregnancy, unspecified, first trimester: Secondary | ICD-10-CM

## 2016-08-18 MED ORDER — VITAFOL GUMMIES 3.33-0.333-34.8 MG PO CHEW: 3.0000 | CHEWABLE_TABLET | Freq: Every day | ORAL | 11 refills | Status: DC

## 2016-08-18 NOTE — Progress Notes (Signed)
Subjective:    Tanya Terry is being seen today for her first obstetrical visit.  This is not a planned pregnancy. She is at [redacted]w[redacted]d gestation. Her obstetrical history is significant for obesity. Relationship with FOB: significant other, living together. Patient does intend to breast feed. Pregnancy history fully reviewed.  The information documented in the HPI was reviewed and verified.  Menstrual History: OB History    Gravida Para Term Preterm AB Living   3 2 2  0 0 2   SAB TAB Ectopic Multiple Live Births   0 0 0 0 2       Patient's last menstrual period was 05/05/2016 (exact date).    Past Medical History:  Diagnosis Date  . Benign heart murmur   . Peptic ulcer   . Ulcer (HCC)    stomach (no longer taking meds)    Past Surgical History:  Procedure Laterality Date  . MYRINGOTOMY     with tubes  . UPPER GASTROINTESTINAL ENDOSCOPY    . WISDOM TOOTH EXTRACTION       (Not in a hospital admission) Allergies  Allergen Reactions  . Augmentin [Amoxicillin-Pot Clavulanate] Anaphylaxis, Shortness Of Breath and Swelling  . Sulfa Antibiotics Anaphylaxis, Shortness Of Breath and Swelling    Social History  Substance Use Topics  . Smoking status: Former Smoker    Types: Cigarettes  . Smokeless tobacco: Never Used  . Alcohol use No    Family History  Problem Relation Age of Onset  . Diabetes Mother   . Hypertension Mother   . Heart disease Father   . Hypertension Father   . Anesthesia problems Neg Hx   . Hypotension Neg Hx   . Malignant hyperthermia Neg Hx   . Pseudochol deficiency Neg Hx      Review of Systems Constitutional: negative for weight loss Gastrointestinal: negative for vomiting Genitourinary:negative for genital lesions and vaginal discharge and dysuria Musculoskeletal:negative for back pain Behavioral/Psych: negative for abusive relationship, depression, illegal drug usage and tobacco use    Objective:    BP 115/78   Pulse 89   Temp 98.8 F  (37.1 C)   Wt 266 lb (120.7 kg)   LMP 05/05/2016 (Exact Date)   BMI 41.66 kg/m  General Appearance:    Alert, cooperative, no distress, appears stated age  Head:    Normocephalic, without obvious abnormality, atraumatic  Eyes:    PERRL, conjunctiva/corneas clear, EOM's intact, fundi    benign, both eyes  Ears:    Normal TM's and external ear canals, both ears  Nose:   Nares normal, septum midline, mucosa normal, no drainage    or sinus tenderness  Throat:   Lips, mucosa, and tongue normal; teeth and gums normal  Neck:   Supple, symmetrical, trachea midline, no adenopathy;    thyroid:  no enlargement/tenderness/nodules; no carotid   bruit or JVD  Back:     Symmetric, no curvature, ROM normal, no CVA tenderness  Lungs:     Clear to auscultation bilaterally, respirations unlabored  Chest Wall:    No tenderness or deformity   Heart:    Regular rate and rhythm, S1 and S2 normal, no murmur, rub   or gallop  Breast Exam:    No tenderness, masses, or nipple abnormality  Abdomen:     Soft, non-tender, bowel sounds active all four quadrants,    no masses, no organomegaly  Genitalia:    Normal female without lesion, discharge or tenderness  Extremities:   Extremities normal, atraumatic,  no cyanosis or edema  Pulses:   2+ and symmetric all extremities  Skin:   Skin color, texture, turgor normal, no rashes or lesions  Lymph nodes:   Cervical, supraclavicular, and axillary nodes normal  Neurologic:   CNII-XII intact, normal strength, sensation and reflexes    throughout      Lab Review Urine pregnancy test Labs reviewed yes Radiologic studies reviewed no Assessment:    Pregnancy at 8540w4d weeks    Plan:      Prenatal vitamins.  Counseling provided regarding continued use of seat belts, cessation of alcohol consumption, smoking or use of illicit drugs; infection precautions i.e., influenza/TDAP immunizations, toxoplasmosis,CMV, parvovirus, listeria and varicella; workplace safety,  exercise during pregnancy; routine dental care, safe medications, sexual activity, hot tubs, saunas, pools, travel, caffeine use, fish and methlymercury, potential toxins, hair treatments, varicose veins Weight gain recommendations per IOM guidelines reviewed: underweight/BMI< 18.5--> gain 28 - 40 lbs; normal weight/BMI 18.5 - 24.9--> gain 25 - 35 lbs; overweight/BMI 25 - 29.9--> gain 15 - 25 lbs; obese/BMI >30->gain  11 - 20 lbs Problem list reviewed and updated. FIRST/CF mutation testing/NIPT/QUAD SCREEN/fragile X/Ashkenazi Jewish population testing/Spinal muscular atrophy discussed: requested. Role of ultrasound in pregnancy discussed; fetal survey: requested. Amniocentesis discussed: not indicated. VBAC calculator score: VBAC consent form provided Meds ordered this encounter  Medications  . ranitidine (ZANTAC) 150 MG tablet    Sig: Take 150 mg by mouth 2 (two) times daily.  Marland Kitchen. acetaminophen (TYLENOL) 500 MG tablet    Sig: Take 500 mg by mouth every 6 (six) hours as needed.  . Prenatal Vit-Fe Phos-FA-Omega (VITAFOL GUMMIES) 3.33-0.333-34.8 MG CHEW    Sig: Chew 3 tablets by mouth daily before breakfast.    Dispense:  90 tablet    Refill:  11   No orders of the defined types were placed in this encounter.   Follow up in 4 weeks. 50% of 20 min visit spent on counseling and coordination of care. [name], female   DOB: [birth year], 30 y.o.   MRN: [id number]

## 2016-08-18 NOTE — Addendum Note (Signed)
Addended by: Coral CeoHARPER, Ame Heagle A on: 08/18/2016 05:16 PM   Modules accepted: Orders

## 2016-08-18 NOTE — Progress Notes (Signed)
Patient is in the office for initial prenatal visit, she states that she has been having back pain when lifting at work and it has caused her to be absent.

## 2016-08-20 LAB — URINE CULTURE, OB REFLEX

## 2016-08-20 LAB — CULTURE, OB URINE

## 2016-08-23 LAB — CYTOLOGY - PAP
DIAGNOSIS: NEGATIVE
HPV (WINDOPATH): NOT DETECTED

## 2016-08-25 LAB — TOXASSURE SELECT 13 (MW), URINE

## 2016-08-26 ENCOUNTER — Encounter: Payer: Self-pay | Admitting: *Deleted

## 2016-09-15 ENCOUNTER — Ambulatory Visit (INDEPENDENT_AMBULATORY_CARE_PROVIDER_SITE_OTHER): Payer: Medicaid Other | Admitting: Obstetrics

## 2016-09-15 VITALS — BP 115/80 | HR 87 | Temp 97.4°F | Wt 269.9 lb

## 2016-09-15 DIAGNOSIS — Z3492 Encounter for supervision of normal pregnancy, unspecified, second trimester: Secondary | ICD-10-CM | POA: Diagnosis not present

## 2016-09-15 DIAGNOSIS — K5900 Constipation, unspecified: Secondary | ICD-10-CM

## 2016-09-15 DIAGNOSIS — Z3490 Encounter for supervision of normal pregnancy, unspecified, unspecified trimester: Secondary | ICD-10-CM

## 2016-09-15 MED ORDER — DOCUSATE SODIUM 100 MG PO CAPS: 100.0000 mg | ORAL_CAPSULE | Freq: Two times a day (BID) | ORAL | 5 refills | Status: DC

## 2016-09-15 MED ORDER — MAGNESIUM HYDROXIDE 400 MG/5ML PO SUSP: 30.0000 mL | Freq: Every evening | ORAL | 99 refills | Status: DC | PRN

## 2016-09-15 NOTE — Patient Instructions (Signed)

## 2016-09-16 ENCOUNTER — Encounter: Payer: Self-pay | Admitting: Obstetrics

## 2016-09-16 NOTE — Progress Notes (Signed)
  Subjective:    Tanya Terry is a 30 y.o. female being seen today for her obstetrical visit. She is at 7271w5d gestation. Patient reports: no complaints.  Problem List Items Addressed This Visit    None    Visit Diagnoses    Encounter for supervision of low-risk pregnancy, antepartum    -  Primary   Relevant Orders   AFP, Quad Screen   Constipation, unspecified constipation type       Relevant Medications   docusate sodium (COLACE) 100 MG capsule   magnesium hydroxide (MILK OF MAGNESIA) 400 MG/5ML suspension     There are no active problems to display for this patient.   Objective:     BP 115/80   Pulse 87   Temp 97.4 F (36.3 C)   Wt 269 lb 14.4 oz (122.4 kg)   LMP 05/05/2016 (Exact Date)   BMI 42.27 kg/m  Uterine Size: Below umbilicus     Assessment:    Pregnancy @ 2371w5d  weeks Doing well    Plan:    Problem list reviewed and updated. Labs reviewed.  Follow up in 2 weeks. FIRST/CF mutation testing/NIPT/QUAD SCREEN/fragile X/Ashkenazi Jewish population testing/Spinal muscular atrophy discussed: requested. Role of ultrasound in pregnancy discussed; fetal survey: requested. Amniocentesis discussed: not indicated. 50% of 15 minute visit spent on counseling and coordination of care.

## 2016-09-29 ENCOUNTER — Other Ambulatory Visit: Payer: Medicaid Other

## 2016-09-29 DIAGNOSIS — Z3490 Encounter for supervision of normal pregnancy, unspecified, unspecified trimester: Secondary | ICD-10-CM

## 2016-10-02 LAB — AFP, QUAD SCREEN
DIA MOM VALUE: 1.66
DIA Value (EIA): 211.51 pg/mL
DSR (BY AGE) 1 IN: 587
DSR (Second Trimester) 1 IN: 496
Gestational Age: 16.6 WEEKS
MATERNAL AGE AT EDD: 31.3 a
MSAFP Mom: 1.46
MSAFP: 37.1 ng/mL
MSHCG MOM: 1.15
MSHCG: 27414 m[IU]/mL
OSB RISK: 3048
T18 (By Age): 1:2287 {titer}
TEST RESULTS AFP: NEGATIVE
WEIGHT: 269 [lb_av]
uE3 Mom: 0.56
uE3 Value: 0.47 ng/mL

## 2016-10-04 ENCOUNTER — Encounter (HOSPITAL_COMMUNITY): Payer: Self-pay

## 2016-10-04 ENCOUNTER — Inpatient Hospital Stay (HOSPITAL_COMMUNITY)
Admission: AD | Admit: 2016-10-04 | Discharge: 2016-10-07 | DRG: 781 | Disposition: A | Discharge status: Home / Self Care | Payer: Medicaid Other | Source: Ambulatory Visit | Attending: Obstetrics and Gynecology | Admitting: Obstetrics and Gynecology

## 2016-10-04 DIAGNOSIS — Z3A17 17 weeks gestation of pregnancy: Secondary | ICD-10-CM

## 2016-10-04 DIAGNOSIS — Z87891 Personal history of nicotine dependence: Secondary | ICD-10-CM

## 2016-10-04 DIAGNOSIS — K5900 Constipation, unspecified: Secondary | ICD-10-CM | POA: Diagnosis present

## 2016-10-04 DIAGNOSIS — O2302 Infections of kidney in pregnancy, second trimester: Secondary | ICD-10-CM | POA: Diagnosis present

## 2016-10-04 DIAGNOSIS — Z880 Allergy status to penicillin: Secondary | ICD-10-CM

## 2016-10-04 LAB — CBC WITH DIFFERENTIAL/PLATELET
Basophils Absolute: 0 10*3/uL (ref 0.0–0.1)
Basophils Relative: 0 %
EOS PCT: 1 %
Eosinophils Absolute: 0.1 10*3/uL (ref 0.0–0.7)
HCT: 39.9 % (ref 36.0–46.0)
Hemoglobin: 14.2 g/dL (ref 12.0–15.0)
LYMPHS ABS: 2.7 10*3/uL (ref 0.7–4.0)
LYMPHS PCT: 21 %
MCH: 32.1 pg (ref 26.0–34.0)
MCHC: 35.6 g/dL (ref 30.0–36.0)
MCV: 90.3 fL (ref 78.0–100.0)
MONO ABS: 0.4 10*3/uL (ref 0.1–1.0)
MONOS PCT: 3 %
Neutro Abs: 10 10*3/uL — ABNORMAL HIGH (ref 1.7–7.7)
Neutrophils Relative %: 75 %
PLATELETS: 250 10*3/uL (ref 150–400)
RBC: 4.42 MIL/uL (ref 3.87–5.11)
RDW: 12.9 % (ref 11.5–15.5)
WBC: 13.2 10*3/uL — AB (ref 4.0–10.5)

## 2016-10-04 LAB — URINALYSIS, ROUTINE W REFLEX MICROSCOPIC
BILIRUBIN URINE: NEGATIVE
Glucose, UA: NEGATIVE mg/dL
KETONES UR: NEGATIVE mg/dL
NITRITE: NEGATIVE
PROTEIN: NEGATIVE mg/dL
Specific Gravity, Urine: 1.025 (ref 1.005–1.030)
pH: 6 (ref 5.0–8.0)

## 2016-10-04 LAB — URINE MICROSCOPIC-ADD ON: RBC / HPF: NONE SEEN RBC/hpf (ref 0–5)

## 2016-10-04 LAB — CREATININE, SERUM
Creatinine, Ser: 0.59 mg/dL (ref 0.44–1.00)
GFR calc non Af Amer: >60 mL/min (ref >60)

## 2016-10-04 MED ORDER — CYCLOBENZAPRINE HCL 10 MG PO TABS
10.0000 mg | ORAL_TABLET | Freq: Once | ORAL | Status: AC
  Administered 2016-10-04: 10 mg via ORAL
  Filled 2016-10-04: qty 1

## 2016-10-04 MED ORDER — PRENATAL MULTIVITAMIN CH: 1.0000 | ORAL_TABLET | Freq: Every day | ORAL | Status: DC

## 2016-10-04 MED ORDER — GENTAMICIN SULFATE 40 MG/ML IJ SOLN
160.0000 mg | Freq: Three times a day (TID) | INTRAMUSCULAR | Status: DC
  Administered 2016-10-04 – 2016-10-07 (×9): 160 mg via INTRAVENOUS
  Filled 2016-10-04 (×10): qty 4

## 2016-10-04 MED ORDER — ZOLPIDEM TARTRATE 5 MG PO TABS: 5.0000 mg | ORAL_TABLET | Freq: Every evening | ORAL | Status: DC | PRN

## 2016-10-04 MED ORDER — OXYCODONE-ACETAMINOPHEN 5-325 MG PO TABS
1.0000 | ORAL_TABLET | ORAL | Status: DC | PRN
Start: 2016-10-04 — End: 2016-10-07
  Administered 2016-10-04: 2 via ORAL
  Administered 2016-10-04 – 2016-10-06 (×5): 1 via ORAL
  Administered 2016-10-06 (×2): 2 via ORAL
  Administered 2016-10-07 (×2): 1 via ORAL
  Filled 2016-10-04 (×4): qty 1
  Filled 2016-10-04: qty 2
  Filled 2016-10-04 (×3): qty 1
  Filled 2016-10-04 (×2): qty 2

## 2016-10-04 MED ORDER — IBUPROFEN 600 MG PO TABS
600.0000 mg | ORAL_TABLET | Freq: Once | ORAL | Status: AC
  Administered 2016-10-04: 600 mg via ORAL
  Filled 2016-10-04: qty 1

## 2016-10-04 NOTE — MAU Note (Signed)
Gave pt heat pack for her back.

## 2016-10-04 NOTE — MAU Provider Note (Signed)
History     CSN: [id number]  Arrival date and time: 10/04/16 0830   First Provider Initiated Contact with Patient 10/04/16 [phone]      Chief Complaint  Patient presents with  . Back Pain   HPI  Ms.Tanya Terry is a 30 y.o. female 653P2002 @ 4679w2d here in MAU with complaints of lower/upper back pain.  The Pain in her back started last night, the pain is located in the center of her back and radiates all through her back.  Patient works at [employer] throughout the day. She worked on the day that all of this started. She is unsure whether this is the cause of the pain.   + constipation; last BM was 2 days ago. Not taking anything for this. Denies problems with urination.    OB History    Gravida Para Term Preterm AB Living   3 2 2  0 0 2   SAB TAB Ectopic Multiple Live Births   0 0 0 0 2      Past Medical History:  Diagnosis Date  . Benign heart murmur   . Peptic ulcer   . Ulcer (HCC)    stomach (no longer taking meds)    Past Surgical History:  Procedure Laterality Date  . MYRINGOTOMY     with tubes  . UPPER GASTROINTESTINAL ENDOSCOPY    . WISDOM TOOTH EXTRACTION      Family History  Problem Relation Age of Onset  . Diabetes Mother   . Hypertension Mother   . Heart disease Father   . Hypertension Father   . Anesthesia problems Neg Hx   . Hypotension Neg Hx   . Malignant hyperthermia Neg Hx   . Pseudochol deficiency Neg Hx     Social History  Substance Use Topics  . Smoking status: Former Smoker    Types: Cigarettes  . Smokeless tobacco: Never Used  . Alcohol use No    Allergies:  Allergies  Allergen Reactions  . Augmentin [Amoxicillin-Pot Clavulanate] Anaphylaxis, Shortness Of Breath and Swelling  . Sulfa Antibiotics Anaphylaxis, Shortness Of Breath and Swelling    Prescriptions Prior to Admission  Medication Sig Dispense Refill Last Dose  . acetaminophen (TYLENOL) 500 MG tablet Take 500 mg by mouth every 6 (six) hours as  needed.     . docusate sodium (COLACE) 100 MG capsule Take 1 capsule (100 mg total) by mouth 2 (two) times daily. 60 capsule 5   . magnesium hydroxide (MILK OF MAGNESIA) 400 MG/5ML suspension Take 30 mLs by mouth at bedtime as needed for mild constipation. 360 mL prn   . metoCLOPramide (REGLAN) 10 MG tablet Take 1 tablet (10 mg total) by mouth every 6 (six) hours. (Patient not taking: Reported on 08/18/2016) 30 tablet 0 Not Taking  . Prenatal Vit-Fe Phos-FA-Omega (VITAFOL GUMMIES) 3.33-0.333-34.8 MG CHEW Chew 3 tablets by mouth daily before breakfast. 90 tablet 11   . promethazine (PHENERGAN) 25 MG tablet Take 1 tablet (25 mg total) by mouth every 6 (six) hours as needed for nausea or vomiting. (Patient not taking: Reported on 08/18/2016) 30 tablet 0 Not Taking  . ranitidine (ZANTAC) 150 MG tablet Take 150 mg by mouth 2 (two) times daily.      Results for orders placed or performed during the hospital encounter of 10/04/16 (from the past 48 hour(s))  Urinalysis, Routine w reflex microscopic ([facility])     Status: Abnormal   Collection Time: 10/04/16  9:12 AM  Result Value Ref Range   Color, Urine YELLOW YELLOW   APPearance CLEAR CLEAR   Specific Gravity, Urine 1.025 1.005 - 1.030   pH 6.0 5.0 - 8.0   Glucose, UA NEGATIVE NEGATIVE mg/dL   Hgb urine dipstick TRACE (A) NEGATIVE   Bilirubin Urine NEGATIVE NEGATIVE   Ketones, ur NEGATIVE NEGATIVE mg/dL   Protein, ur NEGATIVE NEGATIVE mg/dL   Nitrite NEGATIVE NEGATIVE   Leukocytes, UA MODERATE (A) NEGATIVE  Urine microscopic-add on     Status: Abnormal   Collection Time: 10/04/16  9:12 AM  Result Value Ref Range   Squamous Epithelial / LPF 0-5 (A) NONE SEEN   WBC, UA TOO NUMEROUS TO COUNT 0 - 5 WBC/hpf   RBC / HPF NONE SEEN 0 - 5 RBC/hpf   Bacteria, UA FEW (A) NONE SEEN  CBC with Differential     Status: Abnormal   Collection Time: 10/04/16 11:07 AM  Result Value Ref Range   WBC 13.2 (H) 4.0 - 10.5 K/uL   RBC 4.42 3.87 - 5.11 MIL/uL    Hemoglobin 14.2 12.0 - 15.0 g/dL   HCT 40.939.9 81.136.0 - 91.446.0 %   MCV 90.3 78.0 - 100.0 fL   MCH 32.1 26.0 - 34.0 pg   MCHC 35.6 30.0 - 36.0 g/dL   RDW 78.212.9 95.611.5 - 21.315.5 %   Platelets 250 150 - 400 K/uL   Neutrophils Relative % 75 %   Neutro Abs 10.0 (H) 1.7 - 7.7 K/uL   Lymphocytes Relative 21 %   Lymphs Abs 2.7 0.7 - 4.0 K/uL   Monocytes Relative 3 %   Monocytes Absolute 0.4 0.1 - 1.0 K/uL   Eosinophils Relative 1 %   Eosinophils Absolute 0.1 0.0 - 0.7 K/uL   Basophils Relative 0 %   Basophils Absolute 0.0 0.0 - 0.1 K/uL   Review of Systems  Constitutional: Negative for chills and fever.  Genitourinary: Positive for flank pain.  Musculoskeletal: Positive for back pain.   Physical Exam   Blood pressure 125/72, pulse 94, temperature 98.4 F (36.9 C), resp. rate 18, height 5\' 7"  (1.702 m), weight 267 lb (121.1 kg), last menstrual period 05/05/2016.  Physical Exam  Constitutional: She is oriented to person, place, and time. She appears well-developed and well-nourished.  HENT:  Head: Normocephalic.  Eyes: Pupils are equal, round, and reactive to light.  GI: There is CVA tenderness (Bilateral ).  Musculoskeletal: Normal range of motion.  Neurological: She is alert and oriented to person, place, and time.  Skin: Skin is warm.  Psychiatric: Her behavior is normal.    MAU Course  Procedures  None  MDM  + fetal heart tones via doppler CBC with diff UA Ibuprofen 600 mg PO & Flexeril 10 mg PO Urine culture   Anaphylaxis reaction/allergie to Ampicillin and gentamycin.  Discussed admission with Dr. Alysia PennaErvin.   Assessment and Plan   A:  1. Pyelonephritis affecting pregnancy in second trimester     P:  Admit to 3rd floor for IV antibiotics Percocet for pain Urine culture    Duane LopeJennifer I Rasch, NP 10/04/2016 12:26 PM

## 2016-10-04 NOTE — Progress Notes (Addendum)
Pharmacy Antibiotic Note  Tanya Terry is a 30 y.o. female admitted on 10/04/2016 with pyelonephritis.  Pharmacy has been consulted for Gentamicin dosing.  Plan: Gentamicin 160 mg IV Q8hr SCr to confirm CrCl Gentamicin levels if indicated  Height: 5\' 7"  (170.2 cm) Weight: 267 lb (121.1 kg) IBW/kg (Calculated) : 61.6 kg Dosing Weight: 80 kg  Temp (24hrs), Avg:98.4 F (36.9 C), Min:98.4 F (36.9 C), Max:98.4 F (36.9 C)   Recent Labs Lab 10/04/16 1107  WBC 13.2*    CrCl estimated to be 114 ml/min using an estimated SCr of 0.7   Allergies  Allergen Reactions  . Augmentin [Amoxicillin-Pot Clavulanate] Anaphylaxis, Shortness Of Breath and Swelling    Has patient had a PCN reaction causing immediate rash, facial/tongue/throat swelling, SOB or lightheadedness with hypotension: Yes Has patient had a PCN reaction causing severe rash involving mucus membranes or skin necrosis: Yes Has patient had a PCN reaction that required hospitalization No Has patient had a PCN reaction occurring within the last 10 years: Yes If all of the above answers are "NO", then may proceed with Cephalosporin use.   . Sulfa Antibiotics Anaphylaxis, Shortness Of Breath and Swelling    Antimicrobials this admission: Gentamicin 11/27 >>      Microbiology results: 11/27 UCx: pending   Thank you for allowing pharmacy to be a part of this patient's care.  Tanya Terry, Tanya Terry 10/04/2016 12:39 PM   Addendum: SCr 0.59 with estimated CrCl 118 ml/min.  Continue current regimen.

## 2016-10-04 NOTE — H&P (Signed)
FACULTY PRACTICE ANTEPARTUM ADMISSION HISTORY AND PHYSICAL NOTE   History of Present Illness: Tanya Terry is a 30 y.o. [id number] at 3328w2d admitted for pyelonephritis. She started having low back back last night this morning. Subjective fever last night. Present to [facility]U for further evaluation. Noted to have bialteral CVA tenderness, elevated WBC with left shift and UA suggestive of infection. With above findings pt was admitted for IV antibiotic treatment. She denies any VB or LOF. Reports + Fm. Prenatal care has been uncomplicated to his point with Dr. Clear[facility]sHarper [facility]  Patient Active Problem List   Diagnosis Date Noted  . Pyelonephritis affecting pregnancy in second trimester 10/04/2016    Past Medical History:  Diagnosis Date  . Benign heart murmur   . Peptic ulcer   . Ulcer (HCC)    stomach (no longer taking meds)    Past Surgical History:  Procedure Laterality Date  . MYRINGOTOMY     with tubes  . UPPER GASTROINTESTINAL ENDOSCOPY    . WISDOM TOOTH EXTRACTION      OB History  Gravida Para Term Preterm AB Living  3 2 2  0 0 2  SAB TAB Ectopic Multiple Live Births  0 0 0 0 2    # Outcome Date GA Lbr Len/2nd Weight Sex Delivery Anes PTL Lv  3 Current           2 Term 12/15/11 7672w6d 03:09 3.735 kg (8 lb 3.8 oz) M Vag-Spont Local  LIV     Birth Comments: wnl  1 Term      Vag-Spont         Social History   Social History  . Marital status: Married    Spouse name: N/A  . Number of children: N/A  . Years of education: N/A   Social History Main Topics  . Smoking status: Former Smoker    Types: Cigarettes    Quit date: 10/04/2012  . Smokeless tobacco: Never Used  . Alcohol use No  . Drug use: No  . Sexual activity: Yes    Birth control/ protection: None   Other Topics Concern  . None   Social History Narrative  . None    Family History  Problem Relation Age of Onset  . Diabetes Mother   . Hypertension Mother   . Heart disease Father   .  Hypertension Father   . Anesthesia problems Neg Hx   . Hypotension Neg Hx   . Malignant hyperthermia Neg Hx   . Pseudochol deficiency Neg Hx     Allergies  Allergen Reactions  . Augmentin [Amoxicillin-Pot Clavulanate] Anaphylaxis, Shortness Of Breath and Swelling    Has patient had a PCN reaction causing immediate rash, facial/tongue/throat swelling, SOB or lightheadedness with hypotension: Yes Has patient had a PCN reaction causing severe rash involving mucus membranes or skin necrosis: Yes Has patient had a PCN reaction that required hospitalization No Has patient had a PCN reaction occurring within the last 10 years: Yes If all of the above answers are "NO", then may proceed with Cephalosporin use.   . Sulfa Antibiotics Anaphylaxis, Shortness Of Breath and Swelling    Prescriptions Prior to Admission  Medication Sig Dispense Refill Last Dose  . acetaminophen (TYLENOL) 500 MG tablet Take 500 mg by mouth every 6 (six) hours as needed for mild pain or moderate pain.    10/03/2016 at Unknown time  . Prenatal Vit-Fe Phos-FA-Omega (VITAFOL GUMMIES) 3.33-0.333-34.8 MG CHEW Chew 3 tablets by mouth daily before  breakfast. 90 tablet 11 10/04/2016 at Unknown time  . ranitidine (ZANTAC) 150 MG tablet Take 150 mg by mouth 2 (two) times daily.   10/03/2016 at Unknown time  . docusate sodium (COLACE) 100 MG capsule Take 1 capsule (100 mg total) by mouth 2 (two) times daily. (Patient not taking: Reported on 10/04/2016) 60 capsule 5 Not Taking at Unknown time  . magnesium hydroxide (MILK OF MAGNESIA) 400 MG/5ML suspension Take 30 mLs by mouth at bedtime as needed for mild constipation. (Patient not taking: Reported on 10/04/2016) 360 mL prn Not Taking at Unknown time  . metoCLOPramide (REGLAN) 10 MG tablet Take 1 tablet (10 mg total) by mouth every 6 (six) hours. (Patient not taking: Reported on 08/18/2016) 30 tablet 0 Not Taking  . promethazine (PHENERGAN) 25 MG tablet Take 1 tablet (25 mg total) by  mouth every 6 (six) hours as needed for nausea or vomiting. (Patient not taking: Reported on 08/18/2016) 30 tablet 0 Not Taking    Review of Systems - As per HPI  Vitals:  BP 121/63 (BP Location: Right Arm)   Pulse 94   Temp 98.2 F (36.8 C) (Oral)   Resp 16   Ht 5\' 7"  (1.702 m)   Wt 117.9 kg (260 lb)   LMP 05/05/2016 (Exact Date)   BMI 40.72 kg/m  Physical Examination: CONSTITUTIONAL: Well-developed, well-nourished female in no acute distress but does appear ill  HENT:  Normocephalic, atraumatic, External right and left ear normal. Oropharynx is clear and moist EYES: Conjunctivae and EOM are normal. Pupils are equal, round, and reactive to light. No scleral icterus.  NECK: Normal range of motion, supple, no masses SKIN: Skin is warm and dry. No rash noted. Not diaphoretic. No erythema. No pallor. NEUROLGIC: Alert and oriented to person, place, and time. Normal reflexes, muscle tone coordination. No cranial nerve deficit noted. PSYCHIATRIC: Normal mood and affect. Normal behavior. Normal judgment and thought content. CARDIOVASCULAR: Normal heart rate noted, regular rhythm RESPIRATORY: Effort and breath sounds normal, no problems with respiration noted ABDOMEN: Soft, nontender, nondistended, gravid. MUSCULOSKELETAL: Bilateral CVA tenderness Normal range of motion. No edema and no tenderness. 2+ distal pulses.  Cervix: Not evaluated.  Membranes:intact Fetal Monitoring:FHT's via doppler   Labs:  Results for orders placed or performed during the hospital encounter of 10/04/16 (from the past 24 hour(s))  Urinalysis, Routine w reflex microscopic (not at [facility])   Collection Time: 10/04/16  9:12 AM  Result Value Ref Range   Color, Urine YELLOW YELLOW   APPearance CLEAR CLEAR   Specific Gravity, Urine 1.025 1.005 - 1.030   pH 6.0 5.0 - 8.0   Glucose, UA NEGATIVE NEGATIVE mg/dL   Hgb urine dipstick TRACE (A) NEGATIVE   Bilirubin Urine NEGATIVE NEGATIVE   Ketones, ur NEGATIVE  NEGATIVE mg/dL   Protein, ur NEGATIVE NEGATIVE mg/dL   Nitrite NEGATIVE NEGATIVE   Leukocytes, UA MODERATE (A) NEGATIVE  Urine microscopic-add on   Collection Time: 10/04/16  9:12 AM  Result Value Ref Range   Squamous Epithelial / LPF 0-5 (A) NONE SEEN   WBC, UA TOO NUMEROUS TO COUNT 0 - 5 WBC/hpf   RBC / HPF NONE SEEN 0 - 5 RBC/hpf   Bacteria, UA FEW (A) NONE SEEN  CBC with Differential   Collection Time: 10/04/16 11:07 AM  Result Value Ref Range   WBC 13.2 (H) 4.0 - 10.5 K/uL   RBC 4.42 3.87 - 5.11 MIL/uL   Hemoglobin 14.2 12.0 - 15.0 g/dL   HCT 39.9  36.0 - 46.0 %   MCV 90.3 78.0 - 100.0 fL   MCH 32.1 26.0 - 34.0 pg   MCHC 35.6 30.0 - 36.0 g/dL   RDW 65.7 84.6 - 96.2 %   Platelets 250 150 - 400 K/uL   Neutrophils Relative % 75 %   Neutro Abs 10.0 (H) 1.7 - 7.7 K/uL   Lymphocytes Relative 21 %   Lymphs Abs 2.7 0.7 - 4.0 K/uL   Monocytes Relative 3 %   Monocytes Absolute 0.4 0.1 - 1.0 K/uL   Eosinophils Relative 1 %   Eosinophils Absolute 0.1 0.0 - 0.7 K/uL   Basophils Relative 0 %   Basophils Absolute 0.0 0.0 - 0.1 K/uL  Creatinine, serum   Collection Time: 10/04/16  1:10 PM  Result Value Ref Range   Creatinine, Ser 0.59 0.44 - 1.00 mg/dL   GFR calc non Af Amer >60 >60 mL/min   GFR calc Af Amer >60 >60 mL/min    Imaging Studies: No results found.   Assessment and Plan: IUP 17 2/7 weeks Pyelonephritis  Admit to Antenatal for IV antibiotic treatment. Gentamycin secondary to PCN allergy. POC reviewed with pt. Pt verbalized understanding.  Mackena Plummer L. Alysia Penna, MD, FACOG Attending Obstetrician & Gynecologist Faculty Practice, [facility]

## 2016-10-05 LAB — CULTURE, OB URINE

## 2016-10-05 MED ORDER — FAMOTIDINE 20 MG PO TABS
20.0000 mg | ORAL_TABLET | Freq: Two times a day (BID) | ORAL | Status: DC
  Administered 2016-10-05 – 2016-10-06 (×3): 20 mg via ORAL
  Filled 2016-10-05 (×3): qty 1

## 2016-10-05 NOTE — Progress Notes (Signed)
ACULTY PRACTICE ANTEPARTUM COMPREHENSIVE PROGRESS NOTE  Tanya Terry is a 30 y.o. [id number] at 3435w3d  who is admitted for pyelonephritis Length of Stay:  1  Days  Subjective: Pt reports feeling some better today. Reports less back pain.  She reports + FM   Vitals:  Blood pressure (!) 107/49, pulse 84, temperature 98.4 F (36.9 C), temperature source Oral, resp. rate 18, height 5\' 7"  (1.702 m), weight 117.9 kg (260 lb), last menstrual period 05/05/2016, SpO2 99 %.   Physical Examination: AF VSS Lungs clear Heart RRR Abd soft + BS gravid  Back decreased CVA tenderness  Fetal Monitoring: FHT's doppler yesterday  Labs:  Results for orders placed or performed during the hospital encounter of 10/04/16 (from the past 24 hour(s))  Urinalysis, Routine w reflex microscopic (not at [facility])   Collection Time: 10/04/16  9:12 AM  Result Value Ref Range   Color, Urine YELLOW YELLOW   APPearance CLEAR CLEAR   Specific Gravity, Urine 1.025 1.005 - 1.030   pH 6.0 5.0 - 8.0   Glucose, UA NEGATIVE NEGATIVE mg/dL   Hgb urine dipstick TRACE (A) NEGATIVE   Bilirubin Urine NEGATIVE NEGATIVE   Ketones, ur NEGATIVE NEGATIVE mg/dL   Protein, ur NEGATIVE NEGATIVE mg/dL   Nitrite NEGATIVE NEGATIVE   Leukocytes, UA MODERATE (A) NEGATIVE  Urine microscopic-add on   Collection Time: 10/04/16  9:12 AM  Result Value Ref Range   Squamous Epithelial / LPF 0-5 (A) NONE SEEN   WBC, UA TOO NUMEROUS TO COUNT 0 - 5 WBC/hpf   RBC / HPF NONE SEEN 0 - 5 RBC/hpf   Bacteria, UA FEW (A) NONE SEEN  CBC with Differential   Collection Time: 10/04/16 11:07 AM  Result Value Ref Range   WBC 13.2 (H) 4.0 - 10.5 K/uL   RBC 4.42 3.87 - 5.11 MIL/uL   Hemoglobin 14.2 12.0 - 15.0 g/dL   HCT 45.439.9 09.836.0 - 11.946.0 %   MCV 90.3 78.0 - 100.0 fL   MCH 32.1 26.0 - 34.0 pg   MCHC 35.6 30.0 - 36.0 g/dL   RDW 14.712.9 82.911.5 - 56.215.5 %   Platelets 250 150 - 400 K/uL   Neutrophils Relative % 75 %   Neutro Abs 10.0 (H) 1.7 - 7.7 K/uL   Lymphocytes Relative 21 %   Lymphs Abs 2.7 0.7 - 4.0 K/uL   Monocytes Relative 3 %   Monocytes Absolute 0.4 0.1 - 1.0 K/uL   Eosinophils Relative 1 %   Eosinophils Absolute 0.1 0.0 - 0.7 K/uL   Basophils Relative 0 %   Basophils Absolute 0.0 0.0 - 0.1 K/uL  Creatinine, serum   Collection Time: 10/04/16  1:10 PM  Result Value Ref Range   Creatinine, Ser 0.59 0.44 - 1.00 mg/dL   GFR calc non Af Amer >60 >60 mL/min   GFR calc Af Amer >60 >60 mL/min    Imaging Studies:       Medications:  Scheduled . gentamicin  160 mg Intravenous Q8H  . prenatal multivitamin  1 tablet Oral Q1200   I have reviewed the patient's current medications.  ASSESSMENT: IUP 17 3/7 weeks Pyelonephritis  PLAN: Improving. Continue with antibiotic treatment. Repeat CBC in Am. Await UC Continue routine antenatal care.   Hermina StaggersMichael L Kynlie Jane 10/05/2016,6:59 AM

## 2016-10-06 DIAGNOSIS — O2302 Infections of kidney in pregnancy, second trimester: Principal | ICD-10-CM

## 2016-10-06 LAB — CBC WITH DIFFERENTIAL/PLATELET
BASOS ABS: 0 10*3/uL (ref 0.0–0.1)
Basophils Relative: 0 %
Eosinophils Absolute: 0.1 10*3/uL (ref 0.0–0.7)
Eosinophils Relative: 1 %
HEMATOCRIT: 35.1 % — AB (ref 36.0–46.0)
HEMOGLOBIN: 12.5 g/dL (ref 12.0–15.0)
LYMPHS PCT: 24 %
Lymphs Abs: 2.3 10*3/uL (ref 0.7–4.0)
MCH: 32 pg (ref 26.0–34.0)
MCHC: 35.6 g/dL (ref 30.0–36.0)
MCV: 89.8 fL (ref 78.0–100.0)
MONO ABS: 0.6 10*3/uL (ref 0.1–1.0)
Monocytes Relative: 6 %
NEUTROS ABS: 6.5 10*3/uL (ref 1.7–7.7)
Neutrophils Relative %: 69 %
Platelets: 226 10*3/uL (ref 150–400)
RBC: 3.91 MIL/uL (ref 3.87–5.11)
RDW: 12.9 % (ref 11.5–15.5)
WBC: 9.5 10*3/uL (ref 4.0–10.5)

## 2016-10-06 MED ORDER — POLYETHYLENE GLYCOL 3350 17 G PO PACK
17.0000 g | PACK | Freq: Two times a day (BID) | ORAL | Status: DC | PRN
  Administered 2016-10-06: 17 g via ORAL
  Filled 2016-10-06: qty 1

## 2016-10-06 NOTE — Progress Notes (Signed)
FACULTY PRACTICE ANTEPARTUM(COMPREHENSIVE) NOTE  Tanya Terry is a 30 y.o. [id number] at 2817w4d  who is admitted for pyelonephritis.   Fetal presentation is unsure. Length of Stay:  2  Days  Subjective: Still has flank pain Patient reports the fetal movement as active. Patient reports uterine contraction  activity as none. Patient reports  vaginal bleeding as none. Patient describes fluid per vagina as None.  Vitals:  Blood pressure (!) 116/42, pulse 71, temperature 98.2 F (36.8 C), temperature source Oral, resp. rate 18, height 5\' 7"  (1.702 m), weight 117.9 kg (260 lb), last menstrual period 05/05/2016, SpO2 99 %. Physical Examination:  General appearance - alert, well appearing, and in no distress Heart - normal rate and regular rhythm Abdomen - soft, nontender, nondistended Fundal Height:  size equals dates Right CVAT Cervical Exam: Not evaluated.  Extremities: extremities normal, atraumatic, no cyanosis or edema Fetal Monitoring:     Labs:  Results for orders placed or performed during the hospital encounter of 10/04/16 (from the past 24 hour(s))  CBC with Differential/Platelet   Collection Time: 10/06/16  5:30 AM  Result Value Ref Range   WBC 9.5 4.0 - 10.5 K/uL   RBC 3.91 3.87 - 5.11 MIL/uL   Hemoglobin 12.5 12.0 - 15.0 g/dL   HCT 29.535.1 (L) 28.436.0 - 13.246.0 %   MCV 89.8 78.0 - 100.0 fL   MCH 32.0 26.0 - 34.0 pg   MCHC 35.6 30.0 - 36.0 g/dL   RDW 44.012.9 10.211.5 - 72.515.5 %   Platelets 226 150 - 400 K/uL   Neutrophils Relative % 69 %   Neutro Abs 6.5 1.7 - 7.7 K/uL   Lymphocytes Relative 24 %   Lymphs Abs 2.3 0.7 - 4.0 K/uL   Monocytes Relative 6 %   Monocytes Absolute 0.6 0.1 - 1.0 K/uL   Eosinophils Relative 1 %   Eosinophils Absolute 0.1 0.0 - 0.7 K/uL   Basophils Relative 0 %   Basophils Absolute 0.0 0.0 - 0.1 K/uL    Results for orders placed or performed during the hospital encounter of 10/04/16  Culture, OB Urine     Status: Abnormal   Collection Time: 10/04/16  9:07  AM  Result Value Ref Range Status   Specimen Description OB CLEAN CATCH  Final   Special Requests NONE  Final   Culture (A)  Final    MULTIPLE SPECIES PRESENT, SUGGEST RECOLLECTION NO GROUP B STREP (S.AGALACTIAE) ISOLATED Performed at [facility]    Report Status 10/05/2016 FINAL  Final     Medications:  Scheduled . famotidine  20 mg Oral BID  . gentamicin  160 mg Intravenous Q8H  . prenatal multivitamin  1 tablet Oral Q1200   I have reviewed the patient's current medications.  ASSESSMENT: Patient Active Problem List   Diagnosis Date Noted  . Pyelonephritis affecting pregnancy in second trimester 10/04/2016    PLAN: Continue IV abx until tenderness subsides  Erika Slaby 10/06/2016,7:52 AM

## 2016-10-07 MED ORDER — NITROFURANTOIN MONOHYD MACRO 100 MG PO CAPS: 100.0000 mg | ORAL_CAPSULE | Freq: Two times a day (BID) | ORAL | 1 refills | Status: DC

## 2016-10-07 MED ORDER — OXYCODONE-ACETAMINOPHEN 5-325 MG PO TABS: 1.0000 | ORAL_TABLET | Freq: Four times a day (QID) | ORAL | 0 refills | Status: DC | PRN

## 2016-10-07 NOTE — Discharge Instructions (Signed)
Pyelonephritis, Adult °Introduction °Pyelonephritis is a kidney infection. The kidneys are the organs that filter a person's blood and move waste out of the bloodstream and into the urine. Urine passes from the kidneys, through the ureters, and into the bladder. There are two main types of pyelonephritis: °· Infections that come on quickly without any warning (acute pyelonephritis). °· Infections that last for a long period of time (chronic pyelonephritis). °In most cases, the infection clears up with treatment and does not cause further problems. More severe infections or chronic infections can sometimes spread to the bloodstream or lead to other problems with the kidneys. °What are the causes? °This condition is usually caused by: °· Bacteria traveling from the bladder to the kidney through infected urine. The urine in the bladder can become infected with bacteria from: °¨ Bladder infection (cystitis). °¨ Inflammation of the prostate gland (prostatitis). °¨ Sexual intercourse, in females. °· Bacteria traveling from the bloodstream to the kidney. °What increases the risk? °This condition is more likely to develop in: °· Pregnant women. °· Older people. °· People who have diabetes. °· People who have kidney stones or bladder stones. °· People who have other abnormalities of the kidney or ureter. °· People who have a catheter placed in the bladder. °· People who have cancer. °· People who are sexually active. °· Women who use spermicides. °· People who have had a prior urinary tract infection. °What are the signs or symptoms? °Symptoms of this condition include: °· Frequent urination. °· Strong or persistent urge to urinate. °· Burning or stinging when urinating. °· Abdominal pain. °· Back pain. °· Pain in the side or flank area. °· Fever. °· Chills. °· Blood in the urine, or dark urine. °· Nausea. °· Vomiting. °How is this diagnosed? °This condition may be diagnosed based on: °· Medical history and physical  exam. °· Urine tests. °· Blood tests. °You may also have imaging tests of the kidneys, such as an ultrasound or CT scan. °How is this treated? °Treatment for this condition may depend on the severity of the infection. °· If the infection is mild and is found early, you may be treated with antibiotic medicines taken by mouth. You will need to drink fluids to remain hydrated. °· If the infection is more severe, you may need to stay in the hospital and receive antibiotics given directly into a vein through an IV tube. You may also need to receive fluids through an IV tube if you are not able to remain hydrated. After your hospital stay, you may need to take oral antibiotics for a period of time. °Other treatments may be required, depending on the cause of the infection. °Follow these instructions at home: °Medicines °· Take over-the-counter and prescription medicines only as told by your health care provider. °· If you were prescribed an antibiotic medicine, take it as told by your health care provider. Do not stop taking the antibiotic even if you start to feel better. °General instructions °· Drink enough fluid to keep your urine clear or pale yellow. °· Avoid caffeine, tea, and carbonated beverages. They tend to irritate the bladder. °· Urinate often. Avoid holding in urine for long periods of time. °· Urinate before and after sex. °· After a bowel movement, women should cleanse from front to back. Use each tissue only once. °· Keep all follow-up visits as told by your health care provider. This is important. °Contact a health care provider if: °· Your symptoms do not get better after   2 days of treatment. °· Your symptoms get worse. °· You have a fever. °Get help right away if: °· You are unable to take your antibiotics or fluids. °· You have shaking chills. °· You vomit. °· You have severe flank or back pain. °· You have extreme weakness or fainting. °This information is not intended to replace advice given to you  by your health care provider. Make sure you discuss any questions you have with your health care provider. °Document Released: 10/25/2005 Document Revised: 04/01/2016 Document Reviewed: 02/17/2015 °© 2017 [employer] ° °

## 2016-10-07 NOTE — Discharge Summary (Signed)
Antenatal Physician Discharge Summary  [name] MRN: [id number] DOB/AGE: [birth year] 30 y.o.  Admit date: 10/04/2016 Discharge date: 10/07/2016  Admission Diagnoses: Pyelonephritis at 17 weeks  Discharge Diagnoses: The same  Prenatal Procedures: none  Hospital Course:  This is a 30 y.o. [id number] with IUP at 4675w5d admitted for pyelonephritis. Treated with Gentamicin, pain improved on analgesia. Remained afebrile. Urine culture came back with multiple species.  She was observed, fetal heart rate monitoring remained reassuring, and she had no signs/symptoms of other maternal-fetal concerns.  She was deemed stable for discharge to home with outpatient follow up.  Discharge Exam: Temp:  [98.3 F (36.8 C)-98.5 F (36.9 C)] 98.3 F (36.8 C) (11/30 0538) Pulse Rate:  [72-90] 78 (11/30 0538) Resp:  [16-20] 16 (11/30 0538) BP: (93-115)/(49-64) 105/53 (11/30 0538) SpO2:  [100 %] 100 % (11/30 0014) Weight:  [266 lb 12.8 oz (121 kg)] 266 lb 12.8 oz (121 kg) (11/29 0849) Physical Examination: Fetal monitoring: FHR: 160 bpm CONSTITUTIONAL: Well-developed, well-nourished female in no acute distress.  HENT:  Normocephalic, atraumatic, External right and left ear normal. Oropharynx is clear and moist EYES: Conjunctivae and EOM are normal. Pupils are equal, round, and reactive to light. No scleral icterus.  NECK: Normal range of motion, supple, no masses SKIN: Skin is warm and dry. No rash noted. Not diaphoretic. No erythema. No pallor. NEUROLGIC: Alert and oriented to person, place, and time. Normal reflexes, muscle tone coordination. No cranial nerve deficit noted. PSYCHIATRIC: Normal mood and affect. Normal behavior. Normal judgment and thought content. CARDIOVASCULAR: Normal heart rate noted, regular rhythm RESPIRATORY: Effort and breath sounds normal, no problems with respiration noted MUSCULOSKELETAL: Normal range of motion. No edema and no tenderness. 2+ distal  pulses. ABDOMEN: Soft, nontender, nondistended, gravid. CERVIX:  Deferred  Significant Diagnostic Studies:  Results for orders placed or performed during the hospital encounter of 10/04/16 (from the past 168 hour(s))  Culture, OB Urine   Collection Time: 10/04/16  9:07 AM  Result Value Ref Range   Specimen Description OB CLEAN CATCH    Special Requests NONE    Culture (A)     MULTIPLE SPECIES PRESENT, SUGGEST RECOLLECTION NO GROUP B STREP (S.AGALACTIAE) ISOLATED [facility]    Report Status 10/05/2016 FINAL   Urinalysis, Routine w reflex microscopic (not at [facility])   Collection Time: 10/04/16  9:12 AM  Result Value Ref Range   Color, Urine YELLOW YELLOW   APPearance CLEAR CLEAR   Specific Gravity, Urine 1.025 1.005 - 1.030   pH 6.0 5.0 - 8.0   Glucose, UA NEGATIVE NEGATIVE mg/dL   Hgb urine dipstick TRACE (A) NEGATIVE   Bilirubin Urine NEGATIVE NEGATIVE   Ketones, ur NEGATIVE NEGATIVE mg/dL   Protein, ur NEGATIVE NEGATIVE mg/dL   Nitrite NEGATIVE NEGATIVE   Leukocytes, UA MODERATE (A) NEGATIVE  Urine microscopic-add on   Collection Time: 10/04/16  9:12 AM  Result Value Ref Range   Squamous Epithelial / LPF 0-5 (A) NONE SEEN   WBC, UA TOO NUMEROUS TO COUNT 0 - 5 WBC/hpf   RBC / HPF NONE SEEN 0 - 5 RBC/hpf   Bacteria, UA FEW (A) NONE SEEN  CBC with Differential   Collection Time: 10/04/16 11:07 AM  Result Value Ref Range   WBC 13.2 (H) 4.0 - 10.5 K/uL   RBC 4.42 3.87 - 5.11 MIL/uL   Hemoglobin 14.2 12.0 - 15.0 g/dL   HCT 14.739.9 82.936.0 - 56.246.0 %   MCV 90.3 78.0 -  100.0 fL   MCH 32.1 26.0 - 34.0 pg   MCHC 35.6 30.0 - 36.0 g/dL   RDW 16.112.9 09.611.5 - 04.515.5 %   Platelets 250 150 - 400 K/uL   Neutrophils Relative % 75 %   Neutro Abs 10.0 (H) 1.7 - 7.7 K/uL   Lymphocytes Relative 21 %   Lymphs Abs 2.7 0.7 - 4.0 K/uL   Monocytes Relative 3 %   Monocytes Absolute 0.4 0.1 - 1.0 K/uL   Eosinophils Relative 1 %   Eosinophils Absolute 0.1 0.0 - 0.7 K/uL   Basophils  Relative 0 %   Basophils Absolute 0.0 0.0 - 0.1 K/uL  Creatinine, serum   Collection Time: 10/04/16  1:10 PM  Result Value Ref Range   Creatinine, Ser 0.59 0.44 - 1.00 mg/dL   GFR calc non Af Amer >60 >60 mL/min   GFR calc Af Amer >60 >60 mL/min  CBC with Differential/Platelet   Collection Time: 10/06/16  5:30 AM  Result Value Ref Range   WBC 9.5 4.0 - 10.5 K/uL   RBC 3.91 3.87 - 5.11 MIL/uL   Hemoglobin 12.5 12.0 - 15.0 g/dL   HCT 40.935.1 (L) 81.136.0 - 91.446.0 %   MCV 89.8 78.0 - 100.0 fL   MCH 32.0 26.0 - 34.0 pg   MCHC 35.6 30.0 - 36.0 g/dL   RDW 78.212.9 95.611.5 - 21.315.5 %   Platelets 226 150 - 400 K/uL   Neutrophils Relative % 69 %   Neutro Abs 6.5 1.7 - 7.7 K/uL   Lymphocytes Relative 24 %   Lymphs Abs 2.3 0.7 - 4.0 K/uL   Monocytes Relative 6 %   Monocytes Absolute 0.6 0.1 - 1.0 K/uL   Eosinophils Relative 1 %   Eosinophils Absolute 0.1 0.0 - 0.7 K/uL   Basophils Relative 0 %   Basophils Absolute 0.0 0.0 - 0.1 K/uL    Discharge Condition: Stable  Disposition: 01-Home or Self Care      Medication List    TAKE these medications   acetaminophen 500 MG tablet Commonly known as:  TYLENOL Take 500 mg by mouth every 6 (six) hours as needed for mild pain or moderate pain.   docusate sodium 100 MG capsule Commonly known as:  COLACE Take 1 capsule (100 mg total) by mouth 2 (two) times daily.   magnesium hydroxide 400 MG/5ML suspension Commonly known as:  MILK OF MAGNESIA Take 30 mLs by mouth at bedtime as needed for mild constipation.   metoCLOPramide 10 MG tablet Commonly known as:  REGLAN Take 1 tablet (10 mg total) by mouth every 6 (six) hours.   nitrofurantoin (macrocrystal-monohydrate) 100 MG capsule Commonly known as:  MACROBID Take 1 capsule (100 mg total) by mouth 2 (two) times daily.   oxyCODONE-acetaminophen 5-325 MG tablet Commonly known as:  PERCOCET/ROXICET Take 1-2 tablets by mouth every 6 (six) hours as needed for moderate pain or severe pain.    promethazine 25 MG tablet Commonly known as:  PHENERGAN Take 1 tablet (25 mg total) by mouth every 6 (six) hours as needed for nausea or vomiting.   ranitidine 150 MG tablet Commonly known as:  ZANTAC Take 150 mg by mouth 2 (two) times daily.   VITAFOL GUMMIES 3.33-0.333-34.8 MG Chew Chew 3 tablets by mouth daily before breakfast.      Follow-up Information    HARPER,CHARLES A, MD Follow up on 10/13/2016.   Specialty:  Obstetrics and Gynecology Why:  As scheduled. Contact information: [address] [place] [address] [phone]           Signed: Tereso Newcomer M.D. 10/07/2016, 7:57 AM

## 2016-10-13 ENCOUNTER — Ambulatory Visit (INDEPENDENT_AMBULATORY_CARE_PROVIDER_SITE_OTHER): Payer: Medicaid Other | Admitting: Obstetrics

## 2016-10-13 VITALS — BP 127/87 | HR 95 | Temp 97.8°F | Wt 274.2 lb

## 2016-10-13 DIAGNOSIS — Z3480 Encounter for supervision of other normal pregnancy, unspecified trimester: Secondary | ICD-10-CM

## 2016-10-13 DIAGNOSIS — Z3482 Encounter for supervision of other normal pregnancy, second trimester: Secondary | ICD-10-CM

## 2016-10-13 NOTE — Progress Notes (Signed)
Patient c/o scartchy throat for the past 2 days

## 2016-10-14 ENCOUNTER — Encounter: Payer: Self-pay | Admitting: Obstetrics

## 2016-10-14 NOTE — Progress Notes (Signed)
Subjective:    Tanya Terry is a 30 y.o. female being seen today for her obstetrical visit. She is at 7157w5d gestation. Patient reports: no complaints . Fetal movement: normal.  Problem List Items Addressed This Visit    None    Visit Diagnoses    Supervision of other normal pregnancy, antepartum    -  Primary   Relevant Orders   US MFM OB COMP + 14 WK     Patient Active Problem List   Diagnosis Date Noted  . Pyelonephritis affecting pregnancy in second trimester 10/04/2016   Objective:    BP 127/87   Pulse 95   Temp 97.8 F (36.6 C)   Wt 274 lb 3.2 oz (124.4 kg)   LMP 05/05/2016 (Exact Date)   BMI 42.95 kg/m  FHT: 150 BPM  Uterine Size: size equals dates     Assessment:    Pregnancy @ 8457w5d    Plan:    Signs and symptoms of preterm labor: discussed.  Labs, problem list reviewed and updated 2 hr GTT planned Follow up in 4 weeks.

## 2016-10-21 ENCOUNTER — Telehealth: Payer: Self-pay

## 2016-10-21 NOTE — Telephone Encounter (Signed)
Returned call, pt had questions about medications.

## 2016-10-28 ENCOUNTER — Encounter: Payer: Self-pay | Admitting: Obstetrics

## 2016-10-28 ENCOUNTER — Ambulatory Visit (HOSPITAL_COMMUNITY)
Admission: RE | Admit: 2016-10-28 | Discharge: 2016-10-28 | Disposition: A | Discharge status: Home / Self Care | Payer: Medicaid Other | Source: Ambulatory Visit | Attending: Obstetrics | Admitting: Obstetrics

## 2016-10-28 ENCOUNTER — Other Ambulatory Visit: Payer: Medicaid Other

## 2016-10-28 ENCOUNTER — Ambulatory Visit (INDEPENDENT_AMBULATORY_CARE_PROVIDER_SITE_OTHER): Payer: Medicaid Other | Admitting: Obstetrics

## 2016-10-28 VITALS — BP 125/81 | HR 112 | Wt 271.0 lb

## 2016-10-28 DIAGNOSIS — Z3A20 20 weeks gestation of pregnancy: Secondary | ICD-10-CM | POA: Insufficient documentation

## 2016-10-28 DIAGNOSIS — Z3480 Encounter for supervision of other normal pregnancy, unspecified trimester: Secondary | ICD-10-CM | POA: Diagnosis present

## 2016-10-28 DIAGNOSIS — O2302 Infections of kidney in pregnancy, second trimester: Secondary | ICD-10-CM

## 2016-10-28 DIAGNOSIS — O99212 Obesity complicating pregnancy, second trimester: Secondary | ICD-10-CM | POA: Insufficient documentation

## 2016-10-28 DIAGNOSIS — Z363 Encounter for antenatal screening for malformations: Secondary | ICD-10-CM | POA: Insufficient documentation

## 2016-10-28 NOTE — Addendum Note (Signed)
Encounter addended by: Vivien Rotaachael H Sheza Strickland, RT on: 10/28/2016 11:06 AM<BR>    Actions taken: Imaging Exam ended

## 2016-10-28 NOTE — Progress Notes (Signed)
Subjective:    Tanya LanDansirae L Jayne is a 30 y.o. female being seen today for her obstetrical visit. She is at 9013w5d gestation. Patient reports: no complaints . Fetal movement: normal.  Problem List Items Addressed This Visit    Pyelonephritis affecting pregnancy in second trimester     Patient Active Problem List   Diagnosis Date Noted  . Pyelonephritis affecting pregnancy in second trimester 10/04/2016   Objective:    BP 125/81   Pulse (!) 112   Wt 271 lb (122.9 kg)   LMP 05/05/2016 (Exact Date)   BMI 42.44 kg/m  FHT: 150 BPM  Uterine Size: size equals dates     Assessment:    Pregnancy @ 7513w5d    Plan:    Signs and symptoms of preterm labor: discussed.  Labs, problem list reviewed and updated 2 hr GTT planned Follow up in 4 weeks.  [name], female   DOB: 11/16/1985, 30 y.o.   MRN: [id number]

## 2016-11-08 NOTE — L&D Delivery Note (Signed)
Delivery Note At 9:25 AM a viable female was delivered. Nursing staff entered the room around 9:20 to reposition patient. While preparing for the position change, nurse noticed that baby head was protruding from the vagina. Patient was not aware at that moment that head was delivered. Emergency call was placed by the nursing staff for assistance. Shortly after call, baby was delivered in the bed. Upon delivery, baby was alert, vigorously moving and crying. Nursing staff and physicians arrived in the room and stage 3 of labor was started, while attending to newborn and mother. Placenta was delivered intact with gentle traction, though found to be multilobar. Placenta was sent to pathology for further evaluation.  APGAR:8 ,9. Weight pending. Cord: 3 vessels with the following complications: none .  Cord pH: Not collected.  Anesthesia:  Epidural Episiotomy:  None  Lacerations: None Suture Repair: N/A Est. Blood Loss (mL): 50 ml   Mom to postpartum.  Baby to Couplet care / Skin to Skin.  Lovena NeighboursAbdoulaye Diallo, PGY-1 03/15/2017, 9:46 AM  OB FELLOW DELIVERY ATTESTATION  I was gloved and present for the delivery of the placenta in its entirety, and I agree with the above resident's note.  Patient delivered precipitously before the physician was able to be called to the room.  Jen MowElizabeth Nakiah Osgood, DO OB Fellow

## 2016-11-15 ENCOUNTER — Encounter (HOSPITAL_COMMUNITY): Payer: Self-pay

## 2016-11-15 ENCOUNTER — Inpatient Hospital Stay (HOSPITAL_COMMUNITY)
Admission: AD | Admit: 2016-11-15 | Discharge: 2016-11-15 | Disposition: A | Discharge status: Home / Self Care | Payer: Medicaid Other | Source: Ambulatory Visit | Attending: Family Medicine | Admitting: Family Medicine

## 2016-11-15 DIAGNOSIS — Z79899 Other long term (current) drug therapy: Secondary | ICD-10-CM | POA: Insufficient documentation

## 2016-11-15 DIAGNOSIS — O368120 Decreased fetal movements, second trimester, not applicable or unspecified: Secondary | ICD-10-CM | POA: Diagnosis not present

## 2016-11-15 DIAGNOSIS — M549 Dorsalgia, unspecified: Secondary | ICD-10-CM | POA: Diagnosis not present

## 2016-11-15 DIAGNOSIS — Z87891 Personal history of nicotine dependence: Secondary | ICD-10-CM | POA: Diagnosis not present

## 2016-11-15 DIAGNOSIS — Z3A23 23 weeks gestation of pregnancy: Secondary | ICD-10-CM | POA: Diagnosis not present

## 2016-11-15 DIAGNOSIS — Z880 Allergy status to penicillin: Secondary | ICD-10-CM | POA: Insufficient documentation

## 2016-11-15 DIAGNOSIS — O26892 Other specified pregnancy related conditions, second trimester: Secondary | ICD-10-CM | POA: Insufficient documentation

## 2016-11-15 DIAGNOSIS — R319 Hematuria, unspecified: Secondary | ICD-10-CM | POA: Diagnosis not present

## 2016-11-15 LAB — URINALYSIS, ROUTINE W REFLEX MICROSCOPIC
BILIRUBIN URINE: NEGATIVE
Bilirubin Urine: NEGATIVE
GLUCOSE, UA: NEGATIVE mg/dL
Glucose, UA: NEGATIVE mg/dL
Hgb urine dipstick: NEGATIVE
Hgb urine dipstick: NEGATIVE
Ketones, ur: NEGATIVE mg/dL
Ketones, ur: NEGATIVE mg/dL
NITRITE: NEGATIVE
Nitrite: NEGATIVE
PH: 6 (ref 5.0–8.0)
PROTEIN: NEGATIVE mg/dL
Protein, ur: NEGATIVE mg/dL
SPECIFIC GRAVITY, URINE: 1.025 (ref 1.005–1.030)
Specific Gravity, Urine: 1.02 (ref 1.005–1.030)
pH: 5 (ref 5.0–8.0)

## 2016-11-15 LAB — URINALYSIS, MICROSCOPIC (REFLEX): RBC / HPF: NONE SEEN RBC/hpf (ref 0–5)

## 2016-11-15 LAB — CBC
HEMATOCRIT: 40.5 % (ref 36.0–46.0)
HEMOGLOBIN: 14.1 g/dL (ref 12.0–15.0)
MCH: 31.4 pg (ref 26.0–34.0)
MCHC: 34.8 g/dL (ref 30.0–36.0)
MCV: 90.2 fL (ref 78.0–100.0)
Platelets: 274 10*3/uL (ref 150–400)
RBC: 4.49 MIL/uL (ref 3.87–5.11)
RDW: 13.2 % (ref 11.5–15.5)
WBC: 13.5 10*3/uL — ABNORMAL HIGH (ref 4.0–10.5)

## 2016-11-15 NOTE — MAU Provider Note (Signed)
Chief Complaint:  Decreased Fetal Movement; Back Pain; pelvic pressure; and Hematuria   None     HPI: Tanya Terry is a 31 y.o. G3P2002 at [redacted]w[redacted]d who presents to maternity admissions reporting dysuria and hematuria x 1 days. Denies any fevers, chills, NVD.  Does endorse back pain that is chronic.  Was recently admitted on 11/30 for suspected pyelonephritis and was treated with gentamycin x3 day and discharged with macrobid. At that time she only had a mild leukocytosis and a contaminated UA. Now presenting again with reported hematuria and dysuria that started this morning. Additionally, she was concerned because she did not feel baby move since last night. Currently in the MAU she is reporting good fetal movement.  Denies sexual activity and denies kidney stones.  Denies contractions, leakage of fluid or vaginal bleeding.    Pregnancy Course:   Past Medical History: Past Medical History:  Diagnosis Date  . Benign heart murmur   . Peptic ulcer   . Ulcer (HCC)    stomach (no longer taking meds)    Past obstetric history: OB History  Gravida Para Term Preterm AB Living  3 2 2  0 0 2  SAB TAB Ectopic Multiple Live Births  0 0 0 0 2    # Outcome Date GA Lbr Len/2nd Weight Sex Delivery Anes PTL Lv  3 Current           2 Term 12/15/11 [redacted]w[redacted]d 03:09 3.735 kg (8 lb 3.8 oz) M Vag-Spont Local  LIV     Birth Comments: wnl  1 Term      Vag-Spont         Past Surgical History: Past Surgical History:  Procedure Laterality Date  . MYRINGOTOMY     with tubes  . UPPER GASTROINTESTINAL ENDOSCOPY    . WISDOM TOOTH EXTRACTION       Family History: Family History  Problem Relation Age of Onset  . Diabetes Mother   . Hypertension Mother   . Heart disease Father   . Hypertension Father   . Anesthesia problems Neg Hx   . Hypotension Neg Hx   . Malignant hyperthermia Neg Hx   . Pseudochol deficiency Neg Hx     Social History: Social History  Substance Use Topics  . Smoking status:  Former Smoker    Types: Cigarettes    Quit date: 10/04/2012  . Smokeless tobacco: Never Used  . Alcohol use No    Allergies:  Allergies  Allergen Reactions  . Augmentin [Amoxicillin-Pot Clavulanate] Anaphylaxis, Shortness Of Breath and Swelling    Has patient had a PCN reaction causing immediate rash, facial/tongue/throat swelling, SOB or lightheadedness with hypotension: Yes Has patient had a PCN reaction causing severe rash involving mucus membranes or skin necrosis: Yes Has patient had a PCN reaction that required hospitalization No Has patient had a PCN reaction occurring within the last 10 years: Yes If all of the above answers are "NO", then may proceed with Cephalosporin use.   . Sulfa Antibiotics Anaphylaxis, Shortness Of Breath and Swelling    Meds:  Prescriptions Prior to Admission  Medication Sig Dispense Refill Last Dose  . oxyCODONE-acetaminophen (PERCOCET/ROXICET) 5-325 MG tablet Take 1-2 tablets by mouth every 6 (six) hours as needed for moderate pain or severe pain. 30 tablet 0 Past Month at Unknown time  . polyethylene glycol (MIRALAX / GLYCOLAX) packet Take 17 g by mouth daily as needed for mild constipation.   Past Week at Unknown time  . Prenatal Vit-Fe  Fum-FA-Omega (ONE-A-DAY WOMENS PRENATAL PO) Take 2 each by mouth daily. gummy   11/15/2016 at Unknown time  . ranitidine (ZANTAC) 150 MG tablet Take 150 mg by mouth 2 (two) times daily.   11/15/2016 at Unknown time  . acetaminophen (TYLENOL) 500 MG tablet Take 500 mg by mouth every 6 (six) hours as needed for mild pain or moderate pain.    prn  . docusate sodium (COLACE) 100 MG capsule Take 1 capsule (100 mg total) by mouth 2 (two) times daily. (Patient not taking: Reported on 11/15/2016) 60 capsule 5 Not Taking at Unknown time  . magnesium hydroxide (MILK OF MAGNESIA) 400 MG/5ML suspension Take 30 mLs by mouth at bedtime as needed for mild constipation. (Patient not taking: Reported on 10/28/2016) 360 mL prn Not Taking  .  metoCLOPramide (REGLAN) 10 MG tablet Take 1 tablet (10 mg total) by mouth every 6 (six) hours. (Patient not taking: Reported on 11/15/2016) 30 tablet 0 Not Taking at Unknown time  . Prenatal Vit-Fe Phos-FA-Omega (VITAFOL GUMMIES) 3.33-0.333-34.8 MG CHEW Chew 3 tablets by mouth daily before breakfast. (Patient not taking: Reported on 11/15/2016) 90 tablet 11 Not Taking at Unknown time  . promethazine (PHENERGAN) 25 MG tablet Take 1 tablet (25 mg total) by mouth every 6 (six) hours as needed for nausea or vomiting. (Patient not taking: Reported on 10/28/2016) 30 tablet 0 Not Taking    I have reviewed patient's Past Medical Hx, Surgical Hx, Family Hx, Social Hx, medications and allergies.   ROS:  A comprehensive ROS was negative except per HPI.    Physical Exam   Patient Vitals for the past 24 hrs:  BP Temp Temp src Pulse Resp  11/15/16 0855 131/79 98 F (36.7 C) Oral 111 18   Constitutional: Well-developed, well-nourished female in no acute distress.  Cardiovascular: RRR, no MRG Respiratory: NWOB, CTABL, no wheezing or rhonchi GI: Abd soft, non-tender, gravid appropriate for gestational age. MS: Extremities nontender, no edema, normal ROM, no CVA tenderness, does have diffuse paraspinal tenderness Neurologic: AAOx3 GU: deferred Psych: normal mood and affect    FHT:  Baseline 150s , moderate variability, accelerations present, no decelerations Contractions: no contractions   Labs: Results for orders placed or performed during the hospital encounter of 11/15/16 (from the past 24 hour(s))  Urinalysis, Routine w reflex microscopic     Status: Abnormal   Collection Time: 11/15/16  9:00 AM  Result Value Ref Range   Color, Urine YELLOW YELLOW   APPearance CLOUDY (A) CLEAR   Specific Gravity, Urine 1.020 1.005 - 1.030   pH 5.0 5.0 - 8.0   Glucose, UA NEGATIVE NEGATIVE mg/dL   Hgb urine dipstick NEGATIVE NEGATIVE   Bilirubin Urine NEGATIVE NEGATIVE   Ketones, ur NEGATIVE NEGATIVE mg/dL    Protein, ur NEGATIVE NEGATIVE mg/dL   Nitrite NEGATIVE NEGATIVE   Leukocytes, UA LARGE (A) NEGATIVE   RBC / HPF 0-5 0 - 5 RBC/hpf   WBC, UA 6-30 0 - 5 WBC/hpf   Bacteria, UA RARE (A) NONE SEEN   Squamous Epithelial / LPF 6-30 (A) NONE SEEN   Mucous PRESENT   CBC     Status: Abnormal   Collection Time: 11/15/16  9:37 AM  Result Value Ref Range   WBC 13.5 (H) 4.0 - 10.5 K/uL   RBC 4.49 3.87 - 5.11 MIL/uL   Hemoglobin 14.1 12.0 - 15.0 g/dL   HCT 21.340.5 08.636.0 - 57.846.0 %   MCV 90.2 78.0 - 100.0 fL   MCH 31.4  26.0 - 34.0 pg   MCHC 34.8 30.0 - 36.0 g/dL   RDW 16.1 09.6 - 04.5 %   Platelets 274 150 - 400 K/uL  Urinalysis, Routine w reflex microscopic     Status: Abnormal   Collection Time: 11/15/16 10:15 AM  Result Value Ref Range   Color, Urine YELLOW YELLOW   APPearance HAZY (A) CLEAR   Specific Gravity, Urine 1.025 1.005 - 1.030   pH 6.0 5.0 - 8.0   Glucose, UA NEGATIVE NEGATIVE mg/dL   Hgb urine dipstick NEGATIVE NEGATIVE   Bilirubin Urine NEGATIVE NEGATIVE   Ketones, ur NEGATIVE NEGATIVE mg/dL   Protein, ur NEGATIVE NEGATIVE mg/dL   Nitrite NEGATIVE NEGATIVE   Leukocytes, UA SMALL (A) NEGATIVE  Urinalysis, Microscopic (reflex)     Status: Abnormal   Collection Time: 11/15/16 10:15 AM  Result Value Ref Range   RBC / HPF NONE SEEN 0 - 5 RBC/hpf   WBC, UA 0-5 0 - 5 WBC/hpf   Bacteria, UA FEW (A) NONE SEEN   Squamous Epithelial / LPF 0-5 (A) NONE SEEN    Imaging:  [place] Mfm Ob Comp + 14 Wk  Result Date: 10/28/2016 OBSTETRICAL ULTRASOUND: This exam was performed within a [facility]. The OB US report was generated in the AS system, and faxed to the ordering physician.  This report is available in the [employer]. See the [facility] Obstetric US report via the Image Link.   MAU Course: UA/culture:  -initial collection showed 6-30 squams - repeat collection: contaminted CBC: - WBC 13.5 Pain control: - pain 0/10 Fetal monitoring: -FHR baseline 150s, moderate  varibility   MDM: Plan of care reviewed with patient, including labs and tests ordered and medical treatment.   Assessment: 1. [redacted] weeks gestation of pregnancy   2. Hematuria, unspecified type   3. Decreased fetal movements in second trimester, single or unspecified fetus     Plan: Discharge home in stable condition.  Preterm labor precautions and fetal kick counts    Allergies as of 11/15/2016      Reactions   Augmentin [amoxicillin-pot Clavulanate] Anaphylaxis, Shortness Of Breath, Swelling   Has patient had a PCN reaction causing immediate rash, facial/tongue/throat swelling, SOB or lightheadedness with hypotension: Yes Has patient had a PCN reaction causing severe rash involving mucus membranes or skin necrosis: Yes Has patient had a PCN reaction that required hospitalization No Has patient had a PCN reaction occurring within the last 10 years: Yes If all of the above answers are "NO", then may proceed with Cephalosporin use.   Sulfa Antibiotics Anaphylaxis, Shortness Of Breath, Swelling      Medication List    STOP taking these medications   oxyCODONE-acetaminophen 5-325 MG tablet Commonly known as:  PERCOCET/ROXICET     TAKE these medications   acetaminophen 500 MG tablet Commonly known as:  TYLENOL Take 500 mg by mouth every 6 (six) hours as needed for mild pain or moderate pain.   docusate sodium 100 MG capsule Commonly known as:  COLACE Take 1 capsule (100 mg total) by mouth 2 (two) times daily.   magnesium hydroxide 400 MG/5ML suspension Commonly known as:  MILK OF MAGNESIA Take 30 mLs by mouth at bedtime as needed for mild constipation.   metoCLOPramide 10 MG tablet Commonly known as:  REGLAN Take 1 tablet (10 mg total) by mouth every 6 (six) hours.   ONE-A-DAY WOMENS PRENATAL PO Take 2 each by mouth daily. gummy   polyethylene glycol packet Commonly  known as:  MIRALAX / GLYCOLAX Take 17 g by mouth daily as needed for mild constipation.    promethazine 25 MG tablet Commonly known as:  PHENERGAN Take 1 tablet (25 mg total) by mouth every 6 (six) hours as needed for nausea or vomiting.   ranitidine 150 MG tablet Commonly known as:  ZANTAC Take 150 mg by mouth 2 (two) times daily.   VITAFOL GUMMIES 3.33-0.333-34.8 MG Chew Chew 3 tablets by mouth daily before breakfast.       Renne Musca, MD PGY-1 11/15/2016 11:40 AM

## 2016-11-15 NOTE — Discharge Instructions (Signed)
You were seen in the MAU for suspected blood in your urine and decreased fetal movement.  Fortunately, we did not see any blood in your urine and baby is looking well on the monitor. Your pain level is 0/10.  I would recommend continuing to monitor your urine for discoloration and to discuss any further concerns with your Ob/gyn.   As always, if you have any concerns or develop contractions, leakage of fluid or vaginal bleeding or have decreased fetal movement please be seen in the MAU.

## 2016-11-15 NOTE — MAU Note (Signed)
Pt states she hasn't felt her baby move since yesterday, noted blood in her urine this morning, doesn't think it was vaginal bleeding.  C/O lower back pain that started this morning, also some pelvic pressure.

## 2016-11-16 LAB — URINE CULTURE

## 2016-11-19 ENCOUNTER — Telehealth: Payer: Self-pay | Admitting: *Deleted

## 2016-11-19 NOTE — Telephone Encounter (Signed)
Pt called office stating she was sent home from work due to nausea and diarrhea. Pt would like call back.  Return call to pt.  Pt [facility] at any time if she needs eval of symptoms.  Pt states that she is feeling some better currently. Pt was made aware that she may take Immodium OTC for diarrhea. Pt was encouraged to push fluids and eat bland diet.  Pt advised that if at any time she has flu like symptoms she needs to be seen.  Pt states understanding.

## 2016-11-25 ENCOUNTER — Encounter: Payer: Self-pay | Admitting: Obstetrics

## 2016-11-25 ENCOUNTER — Ambulatory Visit (INDEPENDENT_AMBULATORY_CARE_PROVIDER_SITE_OTHER): Payer: Medicaid Other | Admitting: Obstetrics

## 2016-11-25 VITALS — BP 107/73 | HR 109 | Wt 280.0 lb

## 2016-11-25 DIAGNOSIS — Z3480 Encounter for supervision of other normal pregnancy, unspecified trimester: Secondary | ICD-10-CM

## 2016-11-25 DIAGNOSIS — K219 Gastro-esophageal reflux disease without esophagitis: Secondary | ICD-10-CM

## 2016-11-25 DIAGNOSIS — O2302 Infections of kidney in pregnancy, second trimester: Secondary | ICD-10-CM

## 2016-11-25 DIAGNOSIS — O99612 Diseases of the digestive system complicating pregnancy, second trimester: Secondary | ICD-10-CM

## 2016-11-25 MED ORDER — RANITIDINE HCL 150 MG PO TABS: 150.0000 mg | ORAL_TABLET | Freq: Two times a day (BID) | ORAL | 5 refills | Status: DC

## 2016-11-25 NOTE — Progress Notes (Signed)
Pt requests rf for Zantac.

## 2016-11-25 NOTE — Progress Notes (Signed)
Subjective:    Tanya Terry is a 31 y.o. female being seen today for her obstetrical visit. She is at 1819w5d gestation. Patient reports: heartburn . Fetal movement: normal.  Problem List Items Addressed This Visit    Pyelonephritis affecting pregnancy in second trimester    Other Visit Diagnoses    GERD without esophagitis    -  Primary   Relevant Medications   ranitidine (ZANTAC) 150 MG tablet     Patient Active Problem List   Diagnosis Date Noted  . Pyelonephritis affecting pregnancy in second trimester 10/04/2016   Objective:    BP 107/73   Pulse (!) 109   Wt 280 lb (127 kg)   LMP 05/05/2016 (Exact Date)   BMI 43.85 kg/m  FHT: 150 BPM  Uterine Size: size equals dates     Assessment:    Pregnancy @ 9819w5d     Heartburn  Plan:    Zantac Rx  OBGCT: ordered for next visit. Signs and symptoms of preterm labor: discussed.  Labs, problem list reviewed and updated 2 hr GTT planned Follow up in 4 weeks.  [name], female   DOB: 1986-01-09, 31 y.o.   MRN: [id number]

## 2016-12-23 ENCOUNTER — Encounter: Payer: Self-pay | Admitting: Obstetrics

## 2016-12-23 ENCOUNTER — Ambulatory Visit (INDEPENDENT_AMBULATORY_CARE_PROVIDER_SITE_OTHER): Payer: Medicaid Other | Admitting: Obstetrics

## 2016-12-23 ENCOUNTER — Other Ambulatory Visit (HOSPITAL_COMMUNITY)
Admission: RE | Admit: 2016-12-23 | Discharge: 2016-12-23 | Disposition: A | Discharge status: Home / Self Care | Payer: Medicaid Other | Source: Ambulatory Visit | Attending: Obstetrics | Admitting: Obstetrics

## 2016-12-23 ENCOUNTER — Other Ambulatory Visit: Payer: Medicaid Other

## 2016-12-23 VITALS — BP 117/81 | HR 109 | Wt 295.2 lb

## 2016-12-23 DIAGNOSIS — O24419 Gestational diabetes mellitus in pregnancy, unspecified control: Secondary | ICD-10-CM | POA: Diagnosis not present

## 2016-12-23 DIAGNOSIS — O0993 Supervision of high risk pregnancy, unspecified, third trimester: Secondary | ICD-10-CM

## 2016-12-23 DIAGNOSIS — O2303 Infections of kidney in pregnancy, third trimester: Secondary | ICD-10-CM

## 2016-12-23 DIAGNOSIS — O2302 Infections of kidney in pregnancy, second trimester: Secondary | ICD-10-CM

## 2016-12-23 DIAGNOSIS — Z113 Encounter for screening for infections with a predominantly sexual mode of transmission: Secondary | ICD-10-CM | POA: Diagnosis not present

## 2016-12-23 DIAGNOSIS — Z23 Encounter for immunization: Secondary | ICD-10-CM | POA: Diagnosis not present

## 2016-12-23 DIAGNOSIS — Z3480 Encounter for supervision of other normal pregnancy, unspecified trimester: Secondary | ICD-10-CM

## 2016-12-23 NOTE — Addendum Note (Signed)
Addended by: Elby BeckPAUL, Keya Wynes F on: 12/23/2016 11:27 AM   Modules accepted: Orders

## 2016-12-23 NOTE — Progress Notes (Signed)
Patient feels contractions 2-3 times/daily she states they are not strong. Patient cpmplains of numbness in her hips and legs

## 2016-12-23 NOTE — Progress Notes (Signed)
Subjective:  Tanya Terry is a 31 y.o. G3P2002 at 4834w5d being seen today for ongoing prenatal care.  She is currently monitored for the following issues for this low-risk pregnancy and has Pyelonephritis affecting pregnancy in second trimester on her problem list.  Patient reports backache, heartburn and occasional contractions.  Contractions: Irritability. Vag. Bleeding: None.  Movement: Present. Denies leaking of fluid.   The following portions of the patient's history were reviewed and updated as appropriate: allergies, current medications, past family history, past medical history, past social history, past surgical history and problem list. Problem list updated.  Objective:   Vitals:   12/23/16 0838  BP: 117/81  Pulse: (!) 109  Weight: 295 lb 3.2 oz (133.9 kg)    Fetal Status: Fetal Heart Rate (bpm): 150   Movement: Present     General:  Alert, oriented and cooperative. Patient is in no acute distress.  Skin: Skin is warm and dry. No rash noted.   Cardiovascular: Normal heart rate noted  Respiratory: Normal respiratory effort, no problems with respiration noted  Abdomen: Soft, gravid, appropriate for gestational age. Pain/Pressure: Present     Pelvic:  Cervical exam deferred        Extremities: Normal range of motion.  Edema: None  Mental Status: Normal mood and affect. Normal behavior. Normal judgment and thought content.   Urinalysis:      Assessment and Plan:  Pregnancy: G3P2002 at 3434w5d  1. Pyelonephritis affecting pregnancy in second trimester   2. Supervision of other normal pregnancy, antepartum  - Prenatal Profile I - HIV antibody - Hemoglobinopathy evaluation - Varicella zoster antibody, IgG - VITAMIN D 25 Hydroxy (Vit-D Deficiency, Fractures) - Culture, OB Urine - ToxASSURE Select 13 (MW), Urine - Urine cytology ancillary only - US MFM OB FOLLOW UP; Future  Preterm labor symptoms and general obstetric precautions including but not limited to vaginal  bleeding, contractions, leaking of fluid and fetal movement were reviewed in detail with the patient. Please refer to After Visit Summary for other counseling recommendations.  No Follow-up on file.   Brock Badharles A Dafne Nield, MDPatient ID: Tanya Terry, female   DOB: 1986-07-27, 31 y.o.   MRN: [id number]

## 2016-12-23 NOTE — Addendum Note (Signed)
Addended by: Elby BeckPAUL, Eagle Pitta F on: 12/23/2016 09:39 AM   Modules accepted: Orders

## 2016-12-24 ENCOUNTER — Other Ambulatory Visit: Payer: Self-pay | Admitting: Obstetrics

## 2016-12-24 DIAGNOSIS — O24410 Gestational diabetes mellitus in pregnancy, diet controlled: Secondary | ICD-10-CM

## 2016-12-24 LAB — URINE CYTOLOGY ANCILLARY ONLY
CHLAMYDIA, DNA PROBE: NEGATIVE
NEISSERIA GONORRHEA: NEGATIVE
TRICH (WINDOWPATH): NEGATIVE

## 2016-12-24 LAB — GLUCOSE TOLERANCE, 2 HOURS W/ 1HR
GLUCOSE, 1 HOUR: 179 mg/dL (ref 65–179)
Glucose, 2 hour: 77 mg/dL (ref 65–152)
Glucose, Fasting: 98 mg/dL — ABNORMAL HIGH (ref 65–91)

## 2016-12-25 LAB — PRENATAL PROFILE I(LABCORP)
ANTIBODY SCREEN: NEGATIVE
BASOS: 0 %
Basophils Absolute: 0 10*3/uL (ref 0.0–0.2)
EOS (ABSOLUTE): 0.1 10*3/uL (ref 0.0–0.4)
Eos: 1 %
HEMATOCRIT: 37.8 % (ref 34.0–46.6)
HEP B S AG: NEGATIVE
Hemoglobin: 12.5 g/dL (ref 11.1–15.9)
IMMATURE GRANS (ABS): 0.1 10*3/uL (ref 0.0–0.1)
Immature Granulocytes: 1 %
LYMPHS: 13 %
Lymphocytes Absolute: 1.6 10*3/uL (ref 0.7–3.1)
MCH: 30.5 pg (ref 26.6–33.0)
MCHC: 33.1 g/dL (ref 31.5–35.7)
MCV: 92 fL (ref 79–97)
MONOCYTES: 4 %
Monocytes Absolute: 0.5 10*3/uL (ref 0.1–0.9)
Neutrophils Absolute: 9.5 10*3/uL — ABNORMAL HIGH (ref 1.4–7.0)
Neutrophils: 81 %
Platelets: 245 10*3/uL (ref 150–379)
RBC: 4.1 x10E6/uL (ref 3.77–5.28)
RDW: 13 % (ref 12.3–15.4)
RH TYPE: POSITIVE
RPR: NONREACTIVE
RUBELLA: 2.34 {index} (ref >0.99)
WBC: 11.6 10*3/uL — ABNORMAL HIGH (ref 3.4–10.8)

## 2016-12-25 LAB — HEMOGLOBINOPATHY EVALUATION
HGB C: 0 %
HGB S: 0 %
HGB VARIANT: 0 %
Hemoglobin A2 Quantitation: 2.5 % (ref 1.8–3.2)
Hemoglobin F Quantitation: 0 % (ref 0.0–2.0)
Hgb A: 97.5 % (ref 96.4–98.8)

## 2016-12-25 LAB — VARICELLA ZOSTER ANTIBODY, IGG: VARICELLA: 579 {index} (ref >165)

## 2016-12-25 LAB — HIV ANTIBODY (ROUTINE TESTING W REFLEX): HIV Screen 4th Generation wRfx: NONREACTIVE

## 2016-12-25 LAB — VITAMIN D 25 HYDROXY (VIT D DEFICIENCY, FRACTURES): Vit D, 25-Hydroxy: 24.2 ng/mL — ABNORMAL LOW (ref 30.0–100.0)

## 2016-12-27 LAB — CULTURE, OB URINE

## 2016-12-27 LAB — URINE CULTURE, OB REFLEX

## 2016-12-31 LAB — TOXASSURE SELECT 13 (MW), URINE

## 2017-01-06 ENCOUNTER — Ambulatory Visit (INDEPENDENT_AMBULATORY_CARE_PROVIDER_SITE_OTHER): Payer: Medicaid Other | Admitting: Obstetrics

## 2017-01-06 ENCOUNTER — Encounter: Payer: Self-pay | Admitting: Obstetrics

## 2017-01-06 ENCOUNTER — Ambulatory Visit (HOSPITAL_COMMUNITY)
Admission: RE | Admit: 2017-01-06 | Discharge: 2017-01-06 | Disposition: A | Discharge status: Home / Self Care | Payer: Medicaid Other | Source: Ambulatory Visit | Attending: Obstetrics | Admitting: Obstetrics

## 2017-01-06 VITALS — BP 129/85 | HR 116 | Wt 302.0 lb

## 2017-01-06 DIAGNOSIS — Z3483 Encounter for supervision of other normal pregnancy, third trimester: Secondary | ICD-10-CM

## 2017-01-06 DIAGNOSIS — Z362 Encounter for other antenatal screening follow-up: Secondary | ICD-10-CM | POA: Insufficient documentation

## 2017-01-06 DIAGNOSIS — Z3A30 30 weeks gestation of pregnancy: Secondary | ICD-10-CM | POA: Diagnosis not present

## 2017-01-06 DIAGNOSIS — O99213 Obesity complicating pregnancy, third trimester: Secondary | ICD-10-CM | POA: Insufficient documentation

## 2017-01-06 DIAGNOSIS — Z3480 Encounter for supervision of other normal pregnancy, unspecified trimester: Secondary | ICD-10-CM

## 2017-01-06 DIAGNOSIS — O99810 Abnormal glucose complicating pregnancy: Secondary | ICD-10-CM

## 2017-01-06 DIAGNOSIS — O2302 Infections of kidney in pregnancy, second trimester: Secondary | ICD-10-CM

## 2017-01-06 NOTE — Progress Notes (Signed)
Subjective:  Tanya Terry is a 31 y.o. G3P2002 at 3366w5d being seen today for ongoing prenatal care.  She is currently monitored for the following issues for this high-risk pregnancy and has Pyelonephritis affecting pregnancy in second trimester on her problem list.  Patient reports backache and pressure.  Contractions: Irregular. Vag. Bleeding: None.  Movement: Present. Denies leaking of fluid.   The following portions of the patient's history were reviewed and updated as appropriate: allergies, current medications, past family history, past medical history, past social history, past surgical history and problem list. Problem list updated.  Objective:   Vitals:   01/06/17 1633  BP: 129/85  Pulse: (!) 116  Weight: (!) 302 lb (137 kg)    Fetal Status:     Movement: Present     General:  Alert, oriented and cooperative. Patient is in no acute distress.  Skin: Skin is warm and dry. No rash noted.   Cardiovascular: Normal heart rate noted  Respiratory: Normal respiratory effort, no problems with respiration noted  Abdomen: Soft, gravid, appropriate for gestational age. Pain/Pressure: Present     Pelvic:  Cervical exam deferred        Extremities: Normal range of motion.  Edema: None  Mental Status: Normal mood and affect. Normal behavior. Normal judgment and thought content.   Urinalysis:      Assessment and Plan:  Pregnancy: G3P2002 at 4066w5d  1. Pyelonephritis affecting pregnancy in second trimester   Preterm labor symptoms and general obstetric precautions including but not limited to vaginal bleeding, contractions, leaking of fluid and fetal movement were reviewed in detail with the patient. Please refer to After Visit Summary for other counseling recommendations.  No Follow-up on file.   Brock Badharles A Tyeson Tanimoto, MDPatient ID: Tanya Terry, female   DOB: 1986/09/17, 31 y.o.   MRN: [id number] [name], female   DOB: 1986/09/17, 31 y.o.   MRN: [id number]

## 2017-01-06 NOTE — Progress Notes (Signed)
Pt has not attended GDM classes yet.

## 2017-01-12 ENCOUNTER — Telehealth: Payer: Self-pay

## 2017-01-12 NOTE — Telephone Encounter (Signed)
Contacted patient about diabetic teaching, patient stated that she was previously contacted to set up an appt and was unable due to conflict in her schedule, provided patient with contact # to call to get appt scheduled.

## 2017-01-14 ENCOUNTER — Ambulatory Visit (INDEPENDENT_AMBULATORY_CARE_PROVIDER_SITE_OTHER): Payer: Medicaid Other | Admitting: Obstetrics

## 2017-01-14 VITALS — BP 116/72 | HR 108 | Wt 299.0 lb

## 2017-01-14 DIAGNOSIS — O2302 Infections of kidney in pregnancy, second trimester: Secondary | ICD-10-CM

## 2017-01-14 DIAGNOSIS — Z3480 Encounter for supervision of other normal pregnancy, unspecified trimester: Secondary | ICD-10-CM

## 2017-01-14 DIAGNOSIS — Z3483 Encounter for supervision of other normal pregnancy, third trimester: Secondary | ICD-10-CM

## 2017-01-14 DIAGNOSIS — Z3490 Encounter for supervision of normal pregnancy, unspecified, unspecified trimester: Secondary | ICD-10-CM

## 2017-01-14 NOTE — Progress Notes (Signed)
ROB presents w/o complaints

## 2017-01-16 ENCOUNTER — Encounter: Payer: Self-pay | Admitting: Obstetrics

## 2017-01-16 NOTE — Progress Notes (Signed)
Subjective:  Tanya Terry is a 31 y.o. G3P2002 at [redacted]w[redacted]d being seen today for ongoing prenatal care.  She is currently monitored for the following issues for this low-risk pregnancy and has Pyelonephritis affecting pregnancy in second trimester on her problem list.  Patient reports backache.  Contractions: Irregular. Vag. Bleeding: None.  Movement: Present. Denies leaking of fluid.   The following portions of the patient's history were reviewed and updated as appropriate: allergies, current medications, past family history, past medical history, past social history, past surgical history and problem list. Problem list updated.  Objective:   Vitals:   01/14/17 0819  BP: 116/72  Pulse: (!) 108  Weight: 299 lb (135.6 kg)    Fetal Status: Fetal Heart Rate (bpm): 150   Movement: Present     General:  Alert, oriented and cooperative. Patient is in no acute distress.  Skin: Skin is warm and dry. No rash noted.   Cardiovascular: Normal heart rate noted  Respiratory: Normal respiratory effort, no problems with respiration noted  Abdomen: Soft, gravid, appropriate for gestational age. Pain/Pressure: Present     Pelvic:  Cervical exam deferred        Extremities: Normal range of motion.  Edema: Trace  Mental Status: Normal mood and affect. Normal behavior. Normal judgment and thought content.   Urinalysis:      Assessment and Plan:  Pregnancy: G3P2002 at 3055w1d  1. Pyelonephritis affecting pregnancy in second trimester   2. Encounter for supervision of normal pregnancy, antepartum, unspecified gravidity   Preterm labor symptoms and general obstetric precautions including but not limited to vaginal bleeding, contractions, leaking of fluid and fetal movement were reviewed in detail with the patient. Please refer to After Visit Summary for other counseling recommendations.  F/U in 2 weeks   Brock Badharles A Roseanne Juenger, MDPatient ID: Tanya Terry, female   DOB: 06/08/1986, 31 y.o.   MRN:  [id number]

## 2017-01-17 ENCOUNTER — Ambulatory Visit: Payer: Medicaid Other

## 2017-01-20 ENCOUNTER — Encounter: Payer: Self-pay | Admitting: Family Medicine

## 2017-01-20 ENCOUNTER — Ambulatory Visit (INDEPENDENT_AMBULATORY_CARE_PROVIDER_SITE_OTHER): Payer: Medicaid Other | Admitting: Obstetrics

## 2017-01-20 ENCOUNTER — Encounter: Payer: Self-pay | Admitting: Obstetrics

## 2017-01-20 VITALS — BP 112/75 | HR 102 | Wt 299.0 lb

## 2017-01-20 DIAGNOSIS — O24913 Unspecified diabetes mellitus in pregnancy, third trimester: Secondary | ICD-10-CM

## 2017-01-20 DIAGNOSIS — O99810 Abnormal glucose complicating pregnancy: Secondary | ICD-10-CM | POA: Diagnosis not present

## 2017-01-20 DIAGNOSIS — O24419 Gestational diabetes mellitus in pregnancy, unspecified control: Secondary | ICD-10-CM | POA: Insufficient documentation

## 2017-01-20 LAB — GLUCOSE, POCT (MANUAL RESULT ENTRY): POC Glucose: 94 mg/dl (ref 70–99)

## 2017-01-20 NOTE — Progress Notes (Signed)
Subjective:  Tanya Terry is a 31 y.o. G3P2002 at 4060w5d being seen today for ongoing prenatal care.  She is currently monitored for the following issues for this high-risk pregnancy and has Gestational diabetes on her problem list.  Patient reports no complaints.  Contractions: Irregular. Vag. Bleeding: None.  Movement: Present. Denies leaking of fluid.   The following portions of the patient's history were reviewed and updated as appropriate: allergies, current medications, past family history, past medical history, past social history, past surgical history and problem list. Problem list updated.  Objective:   Vitals:   01/20/17 1513  BP: 112/75  Pulse: (!) 102  Weight: 299 lb (135.6 kg)    Fetal Status: Fetal Heart Rate (bpm): 150   Movement: Present     General:  Alert, oriented and cooperative. Patient is in no acute distress.  Skin: Skin is warm and dry. No rash noted.   Cardiovascular: Normal heart rate noted  Respiratory: Normal respiratory effort, no problems with respiration noted  Abdomen: Soft, gravid, appropriate for gestational age. Pain/Pressure: Present     Pelvic:  Cervical exam deferred        Extremities: Normal range of motion.  Edema: None  Mental Status: Normal mood and affect. Normal behavior. Normal judgment and thought content.   Urinalysis:      Assessment and Plan:  Pregnancy: G3P2002 at 6460w5d  1. Gestational diabetes mellitus (GDM) affecting pregnancy, antepartum Rx - POCT Glucose (CBG) = 94 mg/dl  Preterm labor symptoms and general obstetric precautions including but not limited to vaginal bleeding, contractions, leaking of fluid and fetal movement were reviewed in detail with the patient. Please refer to After Visit Summary. F/U in 1 week   Brock Badharles A Jonahtan Manseau, MDPatient ID: Tanya Terry, female   DOB: 08-Mar-1986, 31 y.o.   MRN: [id number]

## 2017-01-20 NOTE — Progress Notes (Signed)
Pt presents for ROB c/o not feeling well suspects elevated glucose. Last meal aprx. 11 am glucose today 94. N&D class r/s for Monday d/t snow last Monday per pt.

## 2017-01-24 ENCOUNTER — Encounter: Payer: Medicaid Other | Attending: Obstetrics

## 2017-01-24 DIAGNOSIS — Z713 Dietary counseling and surveillance: Secondary | ICD-10-CM | POA: Insufficient documentation

## 2017-01-24 DIAGNOSIS — O24919 Unspecified diabetes mellitus in pregnancy, unspecified trimester: Secondary | ICD-10-CM | POA: Diagnosis not present

## 2017-01-24 DIAGNOSIS — Z3A00 Weeks of gestation of pregnancy not specified: Secondary | ICD-10-CM | POA: Diagnosis not present

## 2017-01-25 NOTE — Progress Notes (Signed)
  Patient was seen on 01/24/17  for Gestational Diabetes self-management class at the [facility]. The following learning objectives were met by the patient during this course:   States the definition of Gestational Diabetes  States why dietary management is important in controlling blood glucose  Describes the effects each nutrient has on blood glucose levels  Demonstrates ability to create a balanced meal plan  Demonstrates carbohydrate counting   States when to check blood glucose levels  Demonstrates proper blood glucose monitoring techniques  States the effect of stress and exercise on blood glucose levels  States the importance of limiting caffeine and abstaining from alcohol and smoking  Blood glucose monitor given: yes Lot # [id number] Exp: 02/28/18 Blood glucose reading: 81  Patient instructed to monitor glucose levels: FBS: 60 - <90 1 hour: <140 2 hour: <120  *Patient received handouts:  Nutrition Diabetes and Pregnancy  Carbohydrate Counting List  Patient will be seen for follow-up as needed.

## 2017-01-27 ENCOUNTER — Encounter: Payer: Self-pay | Admitting: Obstetrics

## 2017-01-27 ENCOUNTER — Ambulatory Visit (INDEPENDENT_AMBULATORY_CARE_PROVIDER_SITE_OTHER): Payer: Medicaid Other | Admitting: Obstetrics

## 2017-01-27 VITALS — BP 127/85 | HR 111 | Wt 302.1 lb

## 2017-01-27 DIAGNOSIS — B369 Superficial mycosis, unspecified: Secondary | ICD-10-CM | POA: Diagnosis not present

## 2017-01-27 DIAGNOSIS — R21 Rash and other nonspecific skin eruption: Secondary | ICD-10-CM

## 2017-01-27 DIAGNOSIS — O24913 Unspecified diabetes mellitus in pregnancy, third trimester: Secondary | ICD-10-CM

## 2017-01-27 DIAGNOSIS — O99810 Abnormal glucose complicating pregnancy: Secondary | ICD-10-CM | POA: Diagnosis not present

## 2017-01-27 DIAGNOSIS — Z3483 Encounter for supervision of other normal pregnancy, third trimester: Secondary | ICD-10-CM

## 2017-01-27 DIAGNOSIS — O24419 Gestational diabetes mellitus in pregnancy, unspecified control: Secondary | ICD-10-CM

## 2017-01-27 MED ORDER — HYDROCORTISONE 0.5 % EX CREA: 1.0000 "application " | TOPICAL_CREAM | Freq: Two times a day (BID) | CUTANEOUS | 0 refills | Status: DC

## 2017-01-27 MED ORDER — CLOTRIMAZOLE 1 % EX CREA: 1.0000 "application " | TOPICAL_CREAM | Freq: Two times a day (BID) | CUTANEOUS | 2 refills | Status: DC

## 2017-01-27 NOTE — Progress Notes (Signed)
Patient is having a lot of lower pressure lower pelvis. She has back pain almost like contraction pain- mostly at night. Patient reports she is trying to adjust her diet.

## 2017-01-27 NOTE — Progress Notes (Signed)
Subjective:  Tanya Terry is a 31 y.o. G3P2002 at 2352w5d being seen today for ongoing prenatal care.  She is currently monitored for the following issues for this low-risk pregnancy and has Gestational diabetes on her problem list.  Patient reports rash.  Contractions: Irregular. Vag. Bleeding: None.  Movement: Present. Denies leaking of fluid.   The following portions of the patient's history were reviewed and updated as appropriate: allergies, current medications, past family history, past medical history, past social history, past surgical history and problem list. Problem list updated.  Objective:   Vitals:   01/27/17 0940  BP: 127/85  Pulse: (!) 111  Weight: (!) 302 lb 1.6 oz (137 kg)    Fetal Status:     Movement: Present     General:  Alert, oriented and cooperative. Patient is in no acute distress.  Skin: Skin is warm and dry. No rash noted.   Cardiovascular: Normal heart rate noted  Respiratory: Normal respiratory effort, no problems with respiration noted  Abdomen: Soft, gravid, appropriate for gestational age. Pain/Pressure: Present     Pelvic:  rash upper inner thighs        Extremities: Normal range of motion.  Edema: Trace  Mental Status: Normal mood and affect. Normal behavior. Normal judgment and thought content.   Urinalysis:      Assessment and Plan:  Pregnancy: G3P2002 at 4252w5d  1. Gestational diabetes mellitus (GDM) affecting pregnancy, antepartum Rx: - US MFM OB FOLLOW UP; Future.  Interval Growth  2. Superficial fungal infection of skin Rx: - clotrimazole (LOTRIMIN) 1 % cream; Apply 1 application topically 2 (two) times daily.  Dispense: 113 g; Refill: 2  3. Rash Rx: - hydrocortisone cream 0.5 %; Apply 1 application topically 2 (two) times daily.  Dispense: 56 g; Refill: 0  Preterm labor symptoms and general obstetric precautions including but not limited to vaginal bleeding, contractions, leaking of fluid and fetal movement were reviewed in detail  with the patient. Please refer to After Visit Summary for other counseling recommendations.  Return in 1 week (on 02/03/2017).   Brock Badharles A Harper, MDPatient ID: Tanya Terry, female   DOB: Jul 28, 1986, 31 y.o.   MRN: [id number]

## 2017-01-31 ENCOUNTER — Encounter (HOSPITAL_COMMUNITY): Payer: Self-pay | Admitting: *Deleted

## 2017-01-31 ENCOUNTER — Inpatient Hospital Stay (HOSPITAL_COMMUNITY)
Admission: AD | Admit: 2017-01-31 | Discharge: 2017-01-31 | Disposition: A | Discharge status: Home / Self Care | Payer: Medicaid Other | Source: Ambulatory Visit | Attending: Obstetrics and Gynecology | Admitting: Obstetrics and Gynecology

## 2017-01-31 DIAGNOSIS — Z880 Allergy status to penicillin: Secondary | ICD-10-CM | POA: Diagnosis not present

## 2017-01-31 DIAGNOSIS — O26893 Other specified pregnancy related conditions, third trimester: Secondary | ICD-10-CM | POA: Diagnosis not present

## 2017-01-31 DIAGNOSIS — Z3A34 34 weeks gestation of pregnancy: Secondary | ICD-10-CM | POA: Diagnosis not present

## 2017-01-31 DIAGNOSIS — R1030 Lower abdominal pain, unspecified: Secondary | ICD-10-CM

## 2017-01-31 DIAGNOSIS — Z87891 Personal history of nicotine dependence: Secondary | ICD-10-CM | POA: Insufficient documentation

## 2017-01-31 DIAGNOSIS — R102 Pelvic and perineal pain: Secondary | ICD-10-CM | POA: Diagnosis present

## 2017-01-31 DIAGNOSIS — O9989 Other specified diseases and conditions complicating pregnancy, childbirth and the puerperium: Secondary | ICD-10-CM

## 2017-01-31 DIAGNOSIS — Z79899 Other long term (current) drug therapy: Secondary | ICD-10-CM | POA: Insufficient documentation

## 2017-01-31 DIAGNOSIS — N949 Unspecified condition associated with female genital organs and menstrual cycle: Secondary | ICD-10-CM

## 2017-01-31 LAB — URINALYSIS, ROUTINE W REFLEX MICROSCOPIC
Bilirubin Urine: NEGATIVE
GLUCOSE, UA: NEGATIVE mg/dL
Hgb urine dipstick: NEGATIVE
KETONES UR: 5 mg/dL — AB
NITRITE: NEGATIVE
PH: 5 (ref 5.0–8.0)
Protein, ur: NEGATIVE mg/dL
SPECIFIC GRAVITY, URINE: 1.026 (ref 1.005–1.030)

## 2017-01-31 NOTE — Discharge Instructions (Signed)

## 2017-01-31 NOTE — MAU Note (Signed)
Pt presents complaining of pains in her stomach yesterday that got worse today and is having more pelvic pressure. Denies vaginal bleeding or leaking of fluid. Has not felt the baby move since 5pm.

## 2017-01-31 NOTE — MAU Provider Note (Signed)
History     CSN: [id number]  Arrival date and time: 01/31/17 2008   First Provider Initiated Contact with Patient 01/31/17 2050      Chief Complaint  Patient presents with  . Contractions  . pelvic pressure  . Pelvic Pain   Tanya Terry is a 31 y.o. G3P2002 at [redacted]w[redacted]d who presents today with lower abdominal pain and pelvix pressure since yesterday. She denies any VB or LOF. She confirms normal fetal movement. Next appointment: 02/03/17    Pelvic Pain  The patient's primary symptoms include pelvic pain. The patient's pertinent negatives include no vaginal discharge. This is a new problem. The current episode started yesterday. The problem occurs constantly. The problem has been unchanged. The pain is moderate. The problem affects both sides. She is pregnant. Pertinent negatives include no chills, constipation, diarrhea, fever, nausea or vomiting. The symptoms are aggravated by activity. She has tried nothing for the symptoms.   Past Medical History:  Diagnosis Date  . Benign heart murmur   . Gestational diabetes   . Peptic ulcer   . Ulcer (HCC)    stomach (no longer taking meds)    Past Surgical History:  Procedure Laterality Date  . MYRINGOTOMY     with tubes  . UPPER GASTROINTESTINAL ENDOSCOPY    . WISDOM TOOTH EXTRACTION      Family History  Problem Relation Age of Onset  . Diabetes Mother   . Hypertension Mother   . Heart disease Father   . Hypertension Father   . Anesthesia problems Neg Hx   . Hypotension Neg Hx   . Malignant hyperthermia Neg Hx   . Pseudochol deficiency Neg Hx     Social History  Substance Use Topics  . Smoking status: Former Smoker    Types: Cigarettes    Quit date: 10/04/2012  . Smokeless tobacco: Never Used  . Alcohol use Yes     Comment: not while pregnant    Allergies:  Allergies  Allergen Reactions  . Augmentin [Amoxicillin-Pot Clavulanate] Anaphylaxis, Shortness Of Breath and Swelling    Has patient had a PCN reaction  causing immediate rash, facial/tongue/throat swelling, SOB or lightheadedness with hypotension: Yes Has patient had a PCN reaction causing severe rash involving mucus membranes or skin necrosis: Yes Has patient had a PCN reaction that required hospitalization No Has patient had a PCN reaction occurring within the last 10 years: Yes If all of the above answers are "NO", then may proceed with Cephalosporin use.   . Sulfa Antibiotics Anaphylaxis, Shortness Of Breath and Swelling    Prescriptions Prior to Admission  Medication Sig Dispense Refill Last Dose  . acetaminophen (TYLENOL) 500 MG tablet Take 500 mg by mouth every 6 (six) hours as needed for mild pain or moderate pain.    Past Week at Unknown time  . clotrimazole (LOTRIMIN) 1 % cream Apply 1 application topically 2 (two) times daily. 113 g 2 01/30/2017 at Unknown time  . hydrocortisone cream 0.5 % Apply 1 application topically 2 (two) times daily. 56 g 0 01/30/2017 at Unknown time  . polyethylene glycol (MIRALAX / GLYCOLAX) packet Take 17 g by mouth daily as needed for mild constipation.   Past Week at Unknown time  . Prenatal Vit-Fe Fum-FA-Omega (ONE-A-DAY WOMENS PRENATAL PO) Take 2 each by mouth daily. gummy   01/31/2017 at Unknown time  . ranitidine (ZANTAC) 150 MG tablet Take 1 tablet (150 mg total) by mouth 2 (two) times daily. 60 tablet 5 01/31/2017 at Unknown  time  . docusate sodium (COLACE) 100 MG capsule Take 1 capsule (100 mg total) by mouth 2 (two) times daily. (Patient not taking: Reported on 01/27/2017) 60 capsule 5 Not Taking  . magnesium hydroxide (MILK OF MAGNESIA) 400 MG/5ML suspension Take 30 mLs by mouth at bedtime as needed for mild constipation. (Patient not taking: Reported on 10/28/2016) 360 mL prn Not Taking  . metoCLOPramide (REGLAN) 10 MG tablet Take 1 tablet (10 mg total) by mouth every 6 (six) hours. (Patient not taking: Reported on 11/15/2016) 30 tablet 0 Not Taking  . Prenatal Vit-Fe Phos-FA-Omega (VITAFOL GUMMIES)  3.33-0.333-34.8 MG CHEW Chew 3 tablets by mouth daily before breakfast. (Patient not taking: Reported on 01/06/2017) 90 tablet 11 Not Taking    Review of Systems  Constitutional: Negative for chills and fever.  Gastrointestinal: Negative for constipation, diarrhea, nausea and vomiting.  Genitourinary: Positive for pelvic pain. Negative for vaginal bleeding and vaginal discharge.   Physical Exam   Blood pressure 133/73, pulse (!) 120, temperature 98 F (36.7 C), temperature source Oral, resp. rate 16, last menstrual period 05/05/2016.  Physical Exam  Nursing note and vitals reviewed. Constitutional: She is oriented to person, place, and time. She appears well-developed and well-nourished. No distress.  HENT:  Head: Normocephalic.  Cardiovascular: Normal rate.   Respiratory: Effort normal.  GI: Soft. There is no tenderness. There is no rebound.  Genitourinary:  Genitourinary Comments: Cervix: closed/60/ballotable/vtx   Neurological: She is alert and oriented to person, place, and time.  Skin: Skin is warm and dry.  Psychiatric: She has a normal mood and affect.   Results for orders placed or performed during the hospital encounter of 01/31/17 (from the past 24 hour(s))  Urinalysis, Routine w reflex microscopic     Status: Abnormal   Collection Time: 01/31/17  8:12 PM  Result Value Ref Range   Color, Urine YELLOW YELLOW   APPearance HAZY (A) CLEAR   Specific Gravity, Urine 1.026 1.005 - 1.030   pH 5.0 5.0 - 8.0   Glucose, UA NEGATIVE NEGATIVE mg/dL   Hgb urine dipstick NEGATIVE NEGATIVE   Bilirubin Urine NEGATIVE NEGATIVE   Ketones, ur 5 (A) NEGATIVE mg/dL   Protein, ur NEGATIVE NEGATIVE mg/dL   Nitrite NEGATIVE NEGATIVE   Leukocytes, UA SMALL (A) NEGATIVE   RBC / HPF 0-5 0 - 5 RBC/hpf   WBC, UA 0-5 0 - 5 WBC/hpf   Bacteria, UA RARE (A) NONE SEEN   Squamous Epithelial / LPF 6-30 (A) NONE SEEN   Mucous PRESENT    Ca Oxalate Crys, UA PRESENT    FHT: 145, moderate with 15x15  accels, no decels Toco: no UCs  MAU Course  Procedures  MDM   Assessment and Plan   1. Round ligament pain   2. [redacted] weeks gestation of pregnancy   3. Pelvic pressure in pregnancy, antepartum, third trimester    DC home Comfort measures reviewed  Recommended maternity support belt. Patient states that she has one at home, and she has not been using it. She will start using it.  3rd Trimester precautions  PTL precautions  Fetal kick counts RX: none  Return to MAU as needed FU with OB as planned  Follow-up Information    HARPER,CHARLES A, MD Follow up.   Specialty:  Obstetrics and Gynecology Contact information: [address] [place] [address] [phone]            Tawnya CrookHogan, Heather Donovan 01/31/2017, 8:56 PM

## 2017-02-02 ENCOUNTER — Encounter: Payer: Medicaid Other | Admitting: Certified Nurse Midwife

## 2017-02-02 ENCOUNTER — Encounter: Payer: Medicaid Other | Admitting: Obstetrics and Gynecology

## 2017-02-11 ENCOUNTER — Encounter: Payer: Self-pay | Admitting: Obstetrics

## 2017-02-11 ENCOUNTER — Other Ambulatory Visit (HOSPITAL_COMMUNITY)
Admission: RE | Admit: 2017-02-11 | Discharge: 2017-02-11 | Disposition: A | Discharge status: Home / Self Care | Payer: Medicaid Other | Source: Ambulatory Visit | Attending: Obstetrics | Admitting: Obstetrics

## 2017-02-11 ENCOUNTER — Ambulatory Visit (HOSPITAL_COMMUNITY)
Admission: RE | Admit: 2017-02-11 | Discharge: 2017-02-11 | Disposition: A | Discharge status: Home / Self Care | Payer: Medicaid Other | Source: Ambulatory Visit | Attending: Obstetrics | Admitting: Obstetrics

## 2017-02-11 ENCOUNTER — Ambulatory Visit (INDEPENDENT_AMBULATORY_CARE_PROVIDER_SITE_OTHER): Payer: Medicaid Other | Admitting: Obstetrics

## 2017-02-11 VITALS — BP 136/81 | HR 104 | Wt 306.0 lb

## 2017-02-11 DIAGNOSIS — O99810 Abnormal glucose complicating pregnancy: Secondary | ICD-10-CM

## 2017-02-11 DIAGNOSIS — O24919 Unspecified diabetes mellitus in pregnancy, unspecified trimester: Secondary | ICD-10-CM

## 2017-02-11 DIAGNOSIS — O368390 Maternal care for abnormalities of the fetal heart rate or rhythm, unspecified trimester, not applicable or unspecified: Secondary | ICD-10-CM | POA: Insufficient documentation

## 2017-02-11 DIAGNOSIS — O24419 Gestational diabetes mellitus in pregnancy, unspecified control: Secondary | ICD-10-CM | POA: Insufficient documentation

## 2017-02-11 DIAGNOSIS — Z3A35 35 weeks gestation of pregnancy: Secondary | ICD-10-CM | POA: Insufficient documentation

## 2017-02-11 DIAGNOSIS — Z362 Encounter for other antenatal screening follow-up: Secondary | ICD-10-CM | POA: Diagnosis not present

## 2017-02-11 DIAGNOSIS — Z6841 Body Mass Index (BMI) 40.0 and over, adult: Secondary | ICD-10-CM | POA: Insufficient documentation

## 2017-02-11 DIAGNOSIS — O99213 Obesity complicating pregnancy, third trimester: Secondary | ICD-10-CM | POA: Insufficient documentation

## 2017-02-11 LAB — OB RESULTS CONSOLE GC/CHLAMYDIA: GC PROBE AMP, GENITAL: NEGATIVE

## 2017-02-11 NOTE — Patient Instructions (Addendum)
Breech Birth What is a breech birth? A breech birth is when a baby is born with the buttocks or the feet first. Most babies are in a head down (vertex) position when they are born. There are three types of breech babies:  When the baby's buttocks are showing first in the birth canal (vagina) with the legs straight up and the feet at the baby's head (frank breech).  When the baby's buttocks shows first with the legs bent at the knees and the feet down near the buttocks (complete breech).  When one or both of the baby's feet are down below the buttocks (footling breech). What are the risks of a breech birth? Having a breech birth increases the risk to your baby. A breech birth may cause the following:  Umbilical cord prolapse. This is when the umbilical cord is in front of the baby before or during labor. This can cause the cord to become pinched or compressed. This can reduce the flow of blood and oxygen to the baby.  The baby getting stuck in the birth canal, which can cause injury or, rarely, death.  Injury to the nerves in the shoulder, arm, and hand (brachial plexus injury) when delivered.  Your baby being born too early (prematurely).  An increased need for a cesarean delivery. What increases the risk of having a breech baby? It is not known what causes your baby to be breech. However, risk factors that may increase your chances of having a breech baby include the following:  The mother having had several babies already.  The mother having twins or more.  The mother having a baby with certain congenital disabilities.  The mother going into labor early.  The mother having problems with her uterus, such as a tumor.  The mother having placenta problems (placenta previa) or too much or not enough fluid surrounding the baby (amniotic fluid). How do I know if my baby is breech? There are no symptoms for you to know that your baby is breech. When you are close to your due date, your  health care provider can tell if your baby is breech by:  An abdominal or vaginal (pelvic) exam.  An ultrasound. Your health care provider may also be able to tell that your baby is breech if your baby's heartbeat is heard above your belly button. What can be done if my baby is breech?   Your health care provider may try to turn the baby in your uterus. This is a procedure called external cephalic version (ECV). This is done by your health care provider. He or she will place both hands on your abdomen and gently and slowly turn the baby around. It is important to know that ECV can increase your chances of suddenly going into labor. If an ECV is done, it is done toward the end of a healthy pregnancy. The baby may remain in this position or he or she may turn back to the breech position. You and your health care provider will discuss if an ECV is recommended for you and your baby. How will I delivery my baby if my baby is breech? You and your health care provider will discuss the best way to deliver your baby. If your baby is breech, it is less likely that a vaginal delivery will be recommended due to the risks. Some breech babies may be delivered safely without a cesarean, while in other cases health care providers will recommend a cesarean delivery. This information is not  intended to replace advice given to you by your health care provider. Make sure you discuss any questions you have with your health care provider. Document Released: 12/16/2006 Document Revised: 10/11/2016 Document Reviewed: 08/29/2014 Elsevier Interactive Patient Education  2017 [employer].  Breech Birth What is a breech birth? A breech birth is when a baby is born with the buttocks or the feet first. Most babies are in a head down (vertex) position when they are born. There are three types of breech babies:  When the baby's buttocks are showing first in the birth canal (vagina) with the legs straight up and the feet at the  baby's head (frank breech).  When the baby's buttocks shows first with the legs bent at the knees and the feet down near the buttocks (complete breech).  When one or both of the baby's feet are down below the buttocks (footling breech). What are the risks of a breech birth? Having a breech birth increases the risk to your baby. A breech birth may cause the following:  Umbilical cord prolapse. This is when the umbilical cord is in front of the baby before or during labor. This can cause the cord to become pinched or compressed. This can reduce the flow of blood and oxygen to the baby.  The baby getting stuck in the birth canal, which can cause injury or, rarely, death.  Injury to the nerves in the shoulder, arm, and hand (brachial plexus injury) when delivered.  Your baby being born too early (prematurely).  An increased need for a cesarean delivery. What increases the risk of having a breech baby? It is not known what causes your baby to be breech. However, risk factors that may increase your chances of having a breech baby include the following:  The mother having had several babies already.  The mother having twins or more.  The mother having a baby with certain congenital disabilities.  The mother going into labor early.  The mother having problems with her uterus, such as a tumor.  The mother having placenta problems (placenta previa) or too much or not enough fluid surrounding the baby (amniotic fluid). How do I know if my baby is breech? There are no symptoms for you to know that your baby is breech. When you are close to your due date, your health care provider can tell if your baby is breech by:  An abdominal or vaginal (pelvic) exam.  An ultrasound. Your health care provider may also be able to tell that your baby is breech if your baby's heartbeat is heard above your belly button. What can be done if my baby is breech?   Your health care provider may try to turn the  baby in your uterus. This is a procedure called external cephalic version (ECV). This is done by your health care provider. He or she will place both hands on your abdomen and gently and slowly turn the baby around. It is important to know that ECV can increase your chances of suddenly going into labor. If an ECV is done, it is done toward the end of a healthy pregnancy. The baby may remain in this position or he or she may turn back to the breech position. You and your health care provider will discuss if an ECV is recommended for you and your baby. How will I delivery my baby if my baby is breech? You and your health care provider will discuss the best way to deliver your baby. If your  baby is breech, it is less likely that a vaginal delivery will be recommended due to the risks. Some breech babies may be delivered safely without a cesarean, while in other cases health care providers will recommend a cesarean delivery. This information is not intended to replace advice given to you by your health care provider. Make sure you discuss any questions you have with your health care provider. Document Released: 12/16/2006 Document Revised: 10/11/2016 Document Reviewed: 08/29/2014 Elsevier Interactive Patient Education  2017 [employer].

## 2017-02-11 NOTE — Progress Notes (Signed)
Subjective:  Tanya Terry is a 31 y.o. G3P2002 at [redacted]w[redacted]d being seen today for ongoing prenatal care.  She is currently monitored for the following issues for this high-risk pregnancy and has Gestational diabetes on her problem list.  Patient reports no complaints.  Contractions: Irregular. Vag. Bleeding: None.  Movement: Present. Denies leaking of fluid.   The following portions of the patient's history were reviewed and updated as appropriate: allergies, current medications, past family history, past medical history, past social history, past surgical history and problem list. Problem list updated.  Objective:   Vitals:   02/11/17 1035  BP: 136/81  Pulse: (!) 104  Weight: (!) 306 lb (138.8 kg)    Fetal Status: Fetal Heart Rate (bpm): 150   Movement: Present     General:  Alert, oriented and cooperative. Patient is in no acute distress.  Skin: Skin is warm and dry. No rash noted.   Cardiovascular: Normal heart rate noted  Respiratory: Normal respiratory effort, no problems with respiration noted  Abdomen: Soft, gravid, appropriate for gestational age. Pain/Pressure: Present     Pelvic:  Cervical exam performed        Extremities: Normal range of motion.  Edema: Trace  Mental Status: Normal mood and affect. Normal behavior. Normal judgment and thought content.   Cvx:  Long / Closed / -3 / Breech  Urinalysis:      Assessment and Plan:  Pregnancy: G3P2002 at [redacted]w[redacted]d.  BREECH PRESENTATION ON ULTRASOUND TODAY.  EFW ~ 6lbs.3 oz.  1. Gestational diabetes mellitus (GDM) affecting pregnancy, antepartum Rx: - Strep Gp B NAA - Cervicovaginal ancillary only  Preterm labor symptoms and general obstetric precautions including but not limited to vaginal bleeding, contractions, leaking of fluid and fetal movement were reviewed in detail with the patient. Please refer to After Visit Summary for other counseling recommendations.  Return in about 1 week (around 02/18/2017).   Brock Bad, MDPatient ID: Tanya Terry, female   DOB: May 13, 1986, 31 y.o.   MRN: [id number]

## 2017-02-12 LAB — OB RESULTS CONSOLE GBS: STREP GROUP B AG: NEGATIVE

## 2017-02-13 LAB — STREP GP B NAA: Strep Gp B NAA: NEGATIVE

## 2017-02-14 LAB — CERVICOVAGINAL ANCILLARY ONLY
CANDIDA VAGINITIS: POSITIVE — AB
CHLAMYDIA, DNA PROBE: NEGATIVE
Neisseria Gonorrhea: NEGATIVE
TRICH (WINDOWPATH): NEGATIVE

## 2017-02-15 ENCOUNTER — Other Ambulatory Visit: Payer: Self-pay | Admitting: Obstetrics

## 2017-02-15 DIAGNOSIS — B3731 Acute candidiasis of vulva and vagina: Secondary | ICD-10-CM

## 2017-02-15 DIAGNOSIS — B373 Candidiasis of vulva and vagina: Secondary | ICD-10-CM

## 2017-02-15 MED ORDER — FLUCONAZOLE 150 MG PO TABS: 150.0000 mg | ORAL_TABLET | Freq: Once | ORAL | 0 refills | Status: AC

## 2017-02-17 ENCOUNTER — Telehealth (HOSPITAL_COMMUNITY): Payer: Self-pay | Admitting: *Deleted

## 2017-02-17 ENCOUNTER — Encounter: Payer: Self-pay | Admitting: Obstetrics

## 2017-02-17 ENCOUNTER — Encounter (HOSPITAL_COMMUNITY): Payer: Self-pay | Admitting: *Deleted

## 2017-02-17 ENCOUNTER — Ambulatory Visit (INDEPENDENT_AMBULATORY_CARE_PROVIDER_SITE_OTHER): Payer: Medicaid Other | Admitting: Obstetrics

## 2017-02-17 VITALS — BP 148/82 | HR 109 | Wt 308.0 lb

## 2017-02-17 DIAGNOSIS — O24419 Gestational diabetes mellitus in pregnancy, unspecified control: Secondary | ICD-10-CM | POA: Diagnosis not present

## 2017-02-17 DIAGNOSIS — O99810 Abnormal glucose complicating pregnancy: Secondary | ICD-10-CM

## 2017-02-17 DIAGNOSIS — O0990 Supervision of high risk pregnancy, unspecified, unspecified trimester: Secondary | ICD-10-CM | POA: Insufficient documentation

## 2017-02-17 NOTE — Telephone Encounter (Signed)
Preadmission screen  

## 2017-02-17 NOTE — Progress Notes (Signed)
Subjective:  Tanya Terry is a 31 y.o. G3P2002 at [redacted]w[redacted]d being seen today for ongoing prenatal care.  She is currently monitored for the following issues for this high-risk pregnancy and has Gestational diabetes and Supervision of high risk pregnancy, antepartum on her problem list.  Patient reports no complaints.  Contractions: Irregular. Vag. Bleeding: None.  Movement: Present. Denies leaking of fluid.   The following portions of the patient's history were reviewed and updated as appropriate: allergies, current medications, past family history, past medical history, past social history, past surgical history and problem list. Problem list updated.  Objective:   Vitals:   02/17/17 0928  BP: (!) 148/82  Pulse: (!) 109  Weight: (!) 308 lb (139.7 kg)    Fetal Status: Fetal Heart Rate (bpm): 150   Movement: Present     General:  Alert, oriented and cooperative. Patient is in no acute distress.  Skin: Skin is warm and dry. No rash noted.   Cardiovascular: Normal heart rate noted  Respiratory: Normal respiratory effort, no problems with respiration noted  Abdomen: Soft, gravid, appropriate for gestational age. Pain/Pressure: Present     Pelvic:  Cervical exam deferred        Extremities: Normal range of motion.  Edema: Trace  Mental Status: Normal mood and affect. Normal behavior. Normal judgment and thought content.   Urinalysis:      Assessment and Plan:  Pregnancy: G3P2002 at [redacted]w[redacted]d  1. Supervision of high risk pregnancy, antepartum   Term labor symptoms and general obstetric precautions including but not limited to vaginal bleeding, contractions, leaking of fluid and fetal movement were reviewed in detail with the patient. Please refer to After Visit Summary for other counseling recommendations.  Return in 1 week (on 02/24/2017).   Brock Bad, MDPatient ID: Tanya Terry, female   DOB: 1986-02-08, 31 y.o.   MRN: [id number]

## 2017-02-21 ENCOUNTER — Other Ambulatory Visit: Payer: Self-pay | Admitting: Family Medicine

## 2017-02-22 ENCOUNTER — Observation Stay (HOSPITAL_COMMUNITY)
Admission: RE | Admit: 2017-02-22 | Discharge: 2017-02-22 | Disposition: A | Discharge status: Home / Self Care | Payer: Medicaid Other | Source: Ambulatory Visit | Attending: Family Medicine | Admitting: Family Medicine

## 2017-02-22 DIAGNOSIS — O24419 Gestational diabetes mellitus in pregnancy, unspecified control: Secondary | ICD-10-CM | POA: Diagnosis not present

## 2017-02-22 DIAGNOSIS — O321XX0 Maternal care for breech presentation, not applicable or unspecified: Secondary | ICD-10-CM

## 2017-02-22 DIAGNOSIS — Z3490 Encounter for supervision of normal pregnancy, unspecified, unspecified trimester: Secondary | ICD-10-CM | POA: Diagnosis not present

## 2017-02-22 NOTE — Progress Notes (Signed)
[name], female   DOB: Mar 13, 1986, 31 y.o.   MRN: [id number]  Limited bedside US done - baby vertex. NST reactive. No version needed. F/u in office later this week.  Levie Heritage, DO 02/22/2017 8:46 AM

## 2017-02-24 ENCOUNTER — Encounter: Payer: Self-pay | Admitting: Obstetrics

## 2017-02-24 ENCOUNTER — Ambulatory Visit (INDEPENDENT_AMBULATORY_CARE_PROVIDER_SITE_OTHER): Payer: Medicaid Other | Admitting: Obstetrics

## 2017-02-24 VITALS — BP 145/79 | HR 118 | Wt 310.0 lb

## 2017-02-24 DIAGNOSIS — O24419 Gestational diabetes mellitus in pregnancy, unspecified control: Secondary | ICD-10-CM

## 2017-02-24 DIAGNOSIS — O99810 Abnormal glucose complicating pregnancy: Secondary | ICD-10-CM

## 2017-02-24 DIAGNOSIS — O0993 Supervision of high risk pregnancy, unspecified, third trimester: Secondary | ICD-10-CM

## 2017-02-24 NOTE — Progress Notes (Signed)
Pt states she is very nauseated today.

## 2017-02-24 NOTE — Progress Notes (Signed)
Pt states she would like cervical exam.

## 2017-02-24 NOTE — Progress Notes (Signed)
Subjective:  Tanya Terry is a 31 y.o. G3P2002 at [redacted]w[redacted]d being seen today for ongoing prenatal care.  She is currently monitored for the following issues for this high-risk pregnancy and has Gestational diabetes and Supervision of high risk pregnancy, antepartum on her problem list.  Patient reports nausea.  Contractions: Irregular. Vag. Bleeding: None.  Movement: Present. Denies leaking of fluid.   The following portions of the patient's history were reviewed and updated as appropriate: allergies, current medications, past family history, past medical history, past social history, past surgical history and problem list. Problem list updated.  Objective:   Vitals:   02/24/17 1408  BP: (!) 145/79  Pulse: (!) 118  Weight: (!) 310 lb (140.6 kg)    Fetal Status: Fetal Heart Rate (bpm): 150   Movement: Present     General:  Alert, oriented and cooperative. Patient is in no acute distress.  Skin: Skin is warm and dry. No rash noted.   Cardiovascular: Normal heart rate noted  Respiratory: Normal respiratory effort, no problems with respiration noted  Abdomen: Soft, gravid, appropriate for gestational age. Pain/Pressure: Present     Pelvic:  Cervical exam deferred        Extremities: Normal range of motion.     Mental Status: Normal mood and affect. Normal behavior. Normal judgment and thought content.   Urinalysis:      Assessment and Plan:  Pregnancy: G3P2002 at [redacted]w[redacted]d  There are no diagnoses linked to this encounter. Term labor symptoms and general obstetric precautions including but not limited to vaginal bleeding, contractions, leaking of fluid and fetal movement were reviewed in detail with the patient. Please refer to After Visit Summary for other counseling recommendations.  Return in 1 week (on 03/03/2017) for ROB.   Brock Bad, MDPatient ID: Zoe Lan, female   DOB: [birth year], 31 y.o.   MRN: [id number]

## 2017-03-03 ENCOUNTER — Encounter: Payer: Self-pay | Admitting: Obstetrics

## 2017-03-03 ENCOUNTER — Ambulatory Visit (INDEPENDENT_AMBULATORY_CARE_PROVIDER_SITE_OTHER): Payer: Medicaid Other | Admitting: Obstetrics

## 2017-03-03 ENCOUNTER — Encounter: Payer: Self-pay | Admitting: *Deleted

## 2017-03-03 VITALS — BP 137/79 | HR 126 | Wt 311.0 lb

## 2017-03-03 DIAGNOSIS — O0993 Supervision of high risk pregnancy, unspecified, third trimester: Secondary | ICD-10-CM

## 2017-03-03 DIAGNOSIS — O368131 Decreased fetal movements, third trimester, fetus 1: Secondary | ICD-10-CM

## 2017-03-03 DIAGNOSIS — O0990 Supervision of high risk pregnancy, unspecified, unspecified trimester: Secondary | ICD-10-CM

## 2017-03-03 DIAGNOSIS — K029 Dental caries, unspecified: Secondary | ICD-10-CM

## 2017-03-03 DIAGNOSIS — O99810 Abnormal glucose complicating pregnancy: Secondary | ICD-10-CM

## 2017-03-03 DIAGNOSIS — O24419 Gestational diabetes mellitus in pregnancy, unspecified control: Secondary | ICD-10-CM

## 2017-03-03 MED ORDER — CLINDAMYCIN HCL 300 MG PO CAPS: 300.0000 mg | ORAL_CAPSULE | Freq: Three times a day (TID) | ORAL | 1 refills | Status: DC

## 2017-03-03 NOTE — Progress Notes (Signed)
Subjective:  Tanya Terry is a 31 y.o. G3P2002 at [redacted]w[redacted]d being seen today for ongoing prenatal care.  She is currently monitored for the following issues for this high-risk pregnancy and has Gestational diabetes and Supervision of high risk pregnancy, antepartum on her problem list.  Patient reports occasional contractions.  Contractions: Irregular. Vag. Bleeding: None.  Movement: (!) Decreased. Denies leaking of fluid.   The following portions of the patient's history were reviewed and updated as appropriate: allergies, current medications, past family history, past medical history, past social history, past surgical history and problem list. Problem list updated.  Objective:   Vitals:   03/03/17 0908 03/03/17 0946  BP: (!) 158/87 137/79  Pulse: (!) 120 (!) 126  Weight: (!) 311 lb (141.1 kg)     Fetal Status: Fetal Heart Rate (bpm): 150   Movement: (!) Decreased     General:  Alert, oriented and cooperative. Patient is in no acute distress.  Skin: Skin is warm and dry. No rash noted.   Cardiovascular: Normal heart rate noted  Respiratory: Normal respiratory effort, no problems with respiration noted  Abdomen: Soft, gravid, appropriate for gestational age. Pain/Pressure: Present     Pelvic:  Cervical exam deferred        Extremities: Normal range of motion.     Mental Status: Normal mood and affect. Normal behavior. Normal judgment and thought content.   Urinalysis: Urine Protein: Trace Urine Glucose: Negative  NST:  Reactive  Assessment and Plan:  1. Pregnancy: G3P2002 at [redacted]w[redacted]d  2. Toothache Rx: - Clindamycin Rx - Referred to Dentist   There are no diagnoses linked to this encounter. Term labor symptoms and general obstetric precautions including but not limited to vaginal bleeding, contractions, leaking of fluid and fetal movement were reviewed in detail with the patient. Please refer to After Visit Summary for other counseling recommendations.  Return in 1 week (on  03/10/2017).   Brock Bad, MDPatient ID: Tanya Terry, female   DOB: 23-Nov-1985, 31 y.o.   MRN: [id number]

## 2017-03-09 ENCOUNTER — Ambulatory Visit (INDEPENDENT_AMBULATORY_CARE_PROVIDER_SITE_OTHER): Payer: Medicaid Other | Admitting: Obstetrics

## 2017-03-09 ENCOUNTER — Encounter: Payer: Self-pay | Admitting: Obstetrics

## 2017-03-09 VITALS — BP 115/80 | HR 114 | Wt 315.6 lb

## 2017-03-09 DIAGNOSIS — O99810 Abnormal glucose complicating pregnancy: Secondary | ICD-10-CM

## 2017-03-09 NOTE — Progress Notes (Signed)
Patient states that she has been having contractions every 7 minutes since early this morning, and reports that she has felt decreased fetal movement. Patient denies bleeding.

## 2017-03-09 NOTE — Progress Notes (Signed)
Subjective:  Tanya Terry is a 31 y.o. G3P2002 at [redacted]w[redacted]d being seen today for ongoing prenatal care.  She is currently monitored for the following issues for this high-risk pregnancy and has Gestational diabetes and Supervision of high risk pregnancy, antepartum on her problem list.  Patient reports backache and contractions since am.  Contractions: Irregular. Vag. Bleeding: None.  Movement: (!) Decreased. Denies leaking of fluid.   The following portions of the patient's history were reviewed and updated as appropriate: allergies, current medications, past family history, past medical history, past social history, past surgical history and problem list. Problem list updated.  Objective:   Vitals:   03/09/17 1131  BP: 115/80  Pulse: (!) 114  Weight: (!) 315 lb 9.6 oz (143.2 kg)    Fetal Status: Fetal Heart Rate (bpm): NST   Movement: (!) Decreased     General:  Alert, oriented and cooperative. Patient is in no acute distress.  Skin: Skin is warm and dry. No rash noted.   Cardiovascular: Normal heart rate noted  Respiratory: Normal respiratory effort, no problems with respiration noted  Abdomen: Soft, gravid, appropriate for gestational age. Pain/Pressure: Present     Pelvic:  Cervical exam deferred      Cvx:  1-2cm / 50% / -3 / Vtx  Extremities: Normal range of motion.  Edema: Trace  Mental Status: Normal mood and affect. Normal behavior. Normal judgment and thought content.   Urinalysis:      NST:  Reactive.  Occasional mild UC  Assessment and Plan:  1. Pregnancy: G3P2002 at [redacted]w[redacted]d  2. GDM.  Diet controlled  There are no diagnoses linked to this encounter. Term labor symptoms and general obstetric precautions including but not limited to vaginal bleeding, contractions, leaking of fluid and fetal movement were reviewed in detail with the patient. Please refer to After Visit Summary for other counseling recommendations.  Return in 1 week (on 03/16/2017).   Brock Bad,  MDPatient ID: Tanya Terry, female   DOB: 07-17-1986, 31 y.o.   MRN: [id number]

## 2017-03-10 ENCOUNTER — Encounter: Payer: Medicaid Other | Admitting: Obstetrics

## 2017-03-14 ENCOUNTER — Inpatient Hospital Stay (HOSPITAL_COMMUNITY): Payer: Medicaid Other | Admitting: Anesthesiology

## 2017-03-14 ENCOUNTER — Encounter (HOSPITAL_COMMUNITY): Payer: Self-pay | Admitting: Certified Registered Nurse Anesthetist

## 2017-03-14 ENCOUNTER — Inpatient Hospital Stay (HOSPITAL_COMMUNITY)
Admission: AD | Admit: 2017-03-14 | Discharge: 2017-03-17 | DRG: 775 | Disposition: A | Discharge status: Home / Self Care | Payer: Medicaid Other | Source: Ambulatory Visit | Attending: Family Medicine | Admitting: Family Medicine

## 2017-03-14 ENCOUNTER — Encounter (HOSPITAL_COMMUNITY): Payer: Self-pay | Admitting: *Deleted

## 2017-03-14 DIAGNOSIS — W06XXXA Fall from bed, initial encounter: Secondary | ICD-10-CM | POA: Diagnosis present

## 2017-03-14 DIAGNOSIS — Z6841 Body Mass Index (BMI) 40.0 and over, adult: Secondary | ICD-10-CM | POA: Diagnosis not present

## 2017-03-14 DIAGNOSIS — Z833 Family history of diabetes mellitus: Secondary | ICD-10-CM | POA: Diagnosis not present

## 2017-03-14 DIAGNOSIS — O24420 Gestational diabetes mellitus in childbirth, diet controlled: Principal | ICD-10-CM | POA: Diagnosis present

## 2017-03-14 DIAGNOSIS — Y92003 Bedroom of unspecified non-institutional (private) residence as the place of occurrence of the external cause: Secondary | ICD-10-CM | POA: Diagnosis not present

## 2017-03-14 DIAGNOSIS — Z3A40 40 weeks gestation of pregnancy: Secondary | ICD-10-CM

## 2017-03-14 DIAGNOSIS — O99214 Obesity complicating childbirth: Secondary | ICD-10-CM | POA: Diagnosis present

## 2017-03-14 DIAGNOSIS — Z8249 Family history of ischemic heart disease and other diseases of the circulatory system: Secondary | ICD-10-CM

## 2017-03-14 DIAGNOSIS — Z880 Allergy status to penicillin: Secondary | ICD-10-CM

## 2017-03-14 DIAGNOSIS — O24419 Gestational diabetes mellitus in pregnancy, unspecified control: Secondary | ICD-10-CM

## 2017-03-14 DIAGNOSIS — Z87891 Personal history of nicotine dependence: Secondary | ICD-10-CM

## 2017-03-14 DIAGNOSIS — O0990 Supervision of high risk pregnancy, unspecified, unspecified trimester: Secondary | ICD-10-CM

## 2017-03-14 LAB — CBC
HCT: 38 % (ref 36.0–46.0)
Hemoglobin: 13.1 g/dL (ref 12.0–15.0)
MCH: 30.6 pg (ref 26.0–34.0)
MCHC: 34.5 g/dL (ref 30.0–36.0)
MCV: 88.8 fL (ref 78.0–100.0)
PLATELETS: ADEQUATE 10*3/uL (ref 150–400)
RBC: 4.28 MIL/uL (ref 3.87–5.11)
RDW: 13.6 % (ref 11.5–15.5)
WBC: 13.5 10*3/uL — ABNORMAL HIGH (ref 4.0–10.5)

## 2017-03-14 LAB — TYPE AND SCREEN
ABO/RH(D): O POS
Antibody Screen: NEGATIVE

## 2017-03-14 LAB — COMPREHENSIVE METABOLIC PANEL
ALBUMIN: 3.2 g/dL — AB (ref 3.5–5.0)
ALT: 13 U/L — ABNORMAL LOW (ref 14–54)
AST: 17 U/L (ref 15–41)
Alkaline Phosphatase: 126 U/L (ref 38–126)
Anion gap: 9 (ref 5–15)
BUN: 6 mg/dL (ref 6–20)
CHLORIDE: 106 mmol/L (ref 101–111)
CO2: 21 mmol/L — AB (ref 22–32)
Calcium: 8.8 mg/dL — ABNORMAL LOW (ref 8.9–10.3)
Creatinine, Ser: 0.5 mg/dL (ref 0.44–1.00)
GFR calc Af Amer: >60 mL/min (ref >60)
GFR calc non Af Amer: >60 mL/min (ref >60)
GLUCOSE: 86 mg/dL (ref 65–99)
POTASSIUM: 3.4 mmol/L — AB (ref 3.5–5.1)
SODIUM: 136 mmol/L (ref 135–145)
Total Bilirubin: 0.6 mg/dL (ref 0.3–1.2)
Total Protein: 6.5 g/dL (ref 6.5–8.1)

## 2017-03-14 LAB — PROTEIN / CREATININE RATIO, URINE
CREATININE, URINE: 140 mg/dL
Protein Creatinine Ratio: 0.08 mg/mg{Cre} (ref 0.00–0.15)
Total Protein, Urine: 11 mg/dL

## 2017-03-14 LAB — GLUCOSE, CAPILLARY
GLUCOSE-CAPILLARY: 82 mg/dL (ref 65–99)
GLUCOSE-CAPILLARY: 94 mg/dL (ref 65–99)

## 2017-03-14 MED ORDER — TERBUTALINE SULFATE 1 MG/ML IJ SOLN
0.2500 mg | Freq: Once | INTRAMUSCULAR | Status: DC | PRN
  Filled 2017-03-14: qty 1

## 2017-03-14 MED ORDER — SOD CITRATE-CITRIC ACID 500-334 MG/5ML PO SOLN
30.0000 mL | ORAL | Status: DC | PRN
  Administered 2017-03-14 – 2017-03-15 (×2): 30 mL via ORAL
  Filled 2017-03-14 (×2): qty 15

## 2017-03-14 MED ORDER — LIDOCAINE HCL (PF) 1 % IJ SOLN
30.0000 mL | INTRAMUSCULAR | Status: DC | PRN
  Filled 2017-03-14: qty 30

## 2017-03-14 MED ORDER — PHENYLEPHRINE 40 MCG/ML (10ML) SYRINGE FOR IV PUSH (FOR BLOOD PRESSURE SUPPORT)
80.0000 ug | PREFILLED_SYRINGE | INTRAVENOUS | Status: DC | PRN
  Filled 2017-03-14: qty 5

## 2017-03-14 MED ORDER — DIPHENHYDRAMINE HCL 50 MG/ML IJ SOLN
12.5000 mg | INTRAMUSCULAR | Status: DC | PRN
  Administered 2017-03-15: 12.5 mg via INTRAVENOUS
  Filled 2017-03-14: qty 1

## 2017-03-14 MED ORDER — EPHEDRINE 5 MG/ML INJ
10.0000 mg | INTRAVENOUS | Status: DC | PRN
  Filled 2017-03-14: qty 2

## 2017-03-14 MED ORDER — MISOPROSTOL 25 MCG QUARTER TABLET
25.0000 ug | ORAL_TABLET | ORAL | Status: DC | PRN
  Administered 2017-03-14: 25 ug via VAGINAL
  Filled 2017-03-14 (×2): qty 1

## 2017-03-14 MED ORDER — OXYTOCIN BOLUS FROM INFUSION
500.0000 mL | Freq: Once | INTRAVENOUS | Status: AC
  Administered 2017-03-15: 500 mL via INTRAVENOUS

## 2017-03-14 MED ORDER — LACTATED RINGERS IV SOLN
500.0000 mL | INTRAVENOUS | Status: DC | PRN
  Administered 2017-03-15: 500 mL via INTRAVENOUS

## 2017-03-14 MED ORDER — OXYTOCIN 40 UNITS IN LACTATED RINGERS INFUSION - SIMPLE MED
1.0000 m[IU]/min | INTRAVENOUS | Status: DC
  Administered 2017-03-14: 2 m[IU]/min via INTRAVENOUS
  Filled 2017-03-14: qty 1000

## 2017-03-14 MED ORDER — LACTATED RINGERS IV SOLN: 500.0000 mL | Freq: Once | INTRAVENOUS | Status: DC

## 2017-03-14 MED ORDER — LACTATED RINGERS IV SOLN
500.0000 mL | Freq: Once | INTRAVENOUS | Status: AC
  Administered 2017-03-14: 500 mL via INTRAVENOUS

## 2017-03-14 MED ORDER — TERBUTALINE SULFATE 1 MG/ML IJ SOLN
0.2500 mg | Freq: Once | INTRAMUSCULAR | Status: DC
Start: 2017-03-14 — End: 2017-03-15

## 2017-03-14 MED ORDER — FENTANYL CITRATE (PF) 100 MCG/2ML IJ SOLN
INTRAMUSCULAR | Status: AC
  Administered 2017-03-14: 100 ug via INTRAVENOUS
  Filled 2017-03-14: qty 2

## 2017-03-14 MED ORDER — OXYTOCIN 40 UNITS IN LACTATED RINGERS INFUSION - SIMPLE MED: 2.5000 [IU]/h | INTRAVENOUS | Status: DC

## 2017-03-14 MED ORDER — PHENYLEPHRINE 40 MCG/ML (10ML) SYRINGE FOR IV PUSH (FOR BLOOD PRESSURE SUPPORT)
80.0000 ug | PREFILLED_SYRINGE | INTRAVENOUS | Status: DC | PRN
  Filled 2017-03-14: qty 5
  Filled 2017-03-14: qty 10

## 2017-03-14 MED ORDER — PHENYLEPHRINE 40 MCG/ML (10ML) SYRINGE FOR IV PUSH (FOR BLOOD PRESSURE SUPPORT): 80.0000 ug | PREFILLED_SYRINGE | INTRAVENOUS | Status: DC | PRN

## 2017-03-14 MED ORDER — OXYCODONE-ACETAMINOPHEN 5-325 MG PO TABS: 2.0000 | ORAL_TABLET | ORAL | Status: DC | PRN

## 2017-03-14 MED ORDER — OXYCODONE-ACETAMINOPHEN 5-325 MG PO TABS: 1.0000 | ORAL_TABLET | ORAL | Status: DC | PRN

## 2017-03-14 MED ORDER — ACETAMINOPHEN 325 MG PO TABS
650.0000 mg | ORAL_TABLET | ORAL | Status: DC | PRN
  Administered 2017-03-15: 650 mg via ORAL
  Filled 2017-03-14: qty 2

## 2017-03-14 MED ORDER — FENTANYL CITRATE (PF) 100 MCG/2ML IJ SOLN
100.0000 ug | INTRAMUSCULAR | Status: DC | PRN
  Administered 2017-03-14: 100 ug via INTRAVENOUS
  Filled 2017-03-14: qty 2

## 2017-03-14 MED ORDER — FENTANYL 2.5 MCG/ML BUPIVACAINE 1/10 % EPIDURAL INFUSION (WH - ANES)
14.0000 mL/h | INTRAMUSCULAR | Status: DC | PRN
  Administered 2017-03-14 – 2017-03-15 (×2): 14 mL/h via EPIDURAL
  Filled 2017-03-14 (×2): qty 100

## 2017-03-14 MED ORDER — EPHEDRINE 5 MG/ML INJ: 10.0000 mg | INTRAVENOUS | Status: DC | PRN

## 2017-03-14 MED ORDER — LIDOCAINE HCL (PF) 1 % IJ SOLN
INTRAMUSCULAR | Status: DC | PRN
  Administered 2017-03-14: 5 mL
  Administered 2017-03-14: 3 mL
  Administered 2017-03-14: 2 mL

## 2017-03-14 MED ORDER — LACTATED RINGERS IV SOLN
INTRAVENOUS | Status: DC
  Administered 2017-03-14 – 2017-03-15 (×4): via INTRAVENOUS

## 2017-03-14 MED ORDER — ONDANSETRON HCL 4 MG/2ML IJ SOLN
4.0000 mg | Freq: Four times a day (QID) | INTRAMUSCULAR | Status: DC | PRN
  Administered 2017-03-15: 4 mg via INTRAVENOUS
  Filled 2017-03-14: qty 2

## 2017-03-14 NOTE — Anesthesia Preprocedure Evaluation (Signed)
Anesthesia Evaluation  Patient identified by MRN, date of birth, ID band Patient awake    Reviewed: Allergy & Precautions, NPO status , Patient's Chart, lab work & pertinent test results  Airway Mallampati: III  TM Distance: >3 FB     Dental   Pulmonary former smoker,    breath sounds clear to auscultation       Cardiovascular negative cardio ROS   Rhythm:Regular Rate:Normal     Neuro/Psych negative neurological ROS     GI/Hepatic Neg liver ROS, PUD,   Endo/Other  diabetes, GestationalMorbid obesity  Renal/GU negative Renal ROS     Musculoskeletal   Abdominal   Peds  Hematology negative hematology ROS (+)   Anesthesia Other Findings   Reproductive/Obstetrics (+) Pregnancy                             Lab Results  Component Value Date   WBC 13.5 (H) 03/14/2017   HGB 13.1 03/14/2017   HCT 38.0 03/14/2017   MCV 88.8 03/14/2017   PLT  03/14/2017    PLATELET CLUMPS NOTED ON SMEAR, COUNT APPEARS ADEQUATE   Lab Results  Component Value Date   CREATININE 0.50 03/14/2017   BUN 6 03/14/2017   NA 136 03/14/2017   K 3.4 (L) 03/14/2017   CL 106 03/14/2017   CO2 21 (L) 03/14/2017    Anesthesia Physical Anesthesia Plan  ASA: III  Anesthesia Plan: Epidural   Post-op Pain Management:    Induction:   Airway Management Planned: Natural Airway  Additional Equipment:   Intra-op Plan:   Post-operative Plan:   Informed Consent: I have reviewed the patients History and Physical, chart, labs and discussed the procedure including the risks, benefits and alternatives for the proposed anesthesia with the patient or authorized representative who has indicated his/her understanding and acceptance.     Plan Discussed with:   Anesthesia Plan Comments:         Anesthesia Quick Evaluation

## 2017-03-14 NOTE — Anesthesia Procedure Notes (Signed)
Epidural Patient location during procedure: OB Start time: 03/14/2017 9:55 PM End time: 03/14/2017 10:03 PM  Staffing Anesthesiologist: Marcene DuosFITZGERALD, Anyia Gierke Performed: anesthesiologist   Preanesthetic Checklist Completed: patient identified, site marked, surgical consent, pre-op evaluation, timeout performed, IV checked, risks and benefits discussed and monitors and equipment checked  Epidural Patient position: sitting Prep: site prepped and draped and DuraPrep Patient monitoring: continuous pulse ox and blood pressure Approach: midline Location: L4-L5 Injection technique: LOR air  Needle:  Needle type: Tuohy  Needle gauge: 17 G Needle length: 9 cm and 9 Needle insertion depth: 9 cm Catheter type: closed end flexible Catheter size: 19 Gauge Catheter at skin depth: 17 cm Test dose: negative  Assessment Events: blood not aspirated, injection not painful, no injection resistance, negative IV test and no paresthesia

## 2017-03-14 NOTE — Progress Notes (Addendum)
Assumed care.  Provider at bs assessing pt.   1126: Received orders to cont EFM for another two hrs and provider will recheck cervix.

## 2017-03-14 NOTE — MAU Note (Signed)
Patient states contractions started last night around 9pm and have been coming every 10-15 minutes.  Denies vaginal bleeding or LOF.  Reports decreased fetal movement.  Last felt baby move at 9pm last night.

## 2017-03-14 NOTE — MAU Note (Signed)
Urine in lab 

## 2017-03-14 NOTE — Anesthesia Pain Management Evaluation Note (Signed)
  CRNA Pain Management Visit Note  Patient: Tanya Terry, 31 y.o., female  "Hello [facility] am a [facility]. We have an anesthesia team available at all times to provide care throughout the hospital, including epidural management and anesthesia for C-section. I don't know your plan for the delivery whether it a natural birth, water birth, IV sedation, nitrous supplementation, doula or epidural, but we want to meet your pain goals."   1.Was your pain managed to your expectations on prior hospitalizations?   Yes   2.What is your expectation for pain management during this hospitalization?     Epidural and IV pain meds  3.How can we help you reach that goal? IV pain meds, epidural.  Record the patient's initial score and the patient's pain goal.   Pain: 0  Pain Goal: 8 [facility] wants you to be able to say your pain was always managed very well.  Daveigh Batty L 03/14/2017

## 2017-03-14 NOTE — H&P (Signed)
Tanya Terry is a 31 y.o. female G3P2002 at 3058w2d pt of [facility] admitted for IOL for GDM--diet controlled.  She presented today to maternity admissions reporting abdominal cramping and pelvic pressure that is intermittent x 2-3 days but worsening today, and a fall this morning when getting out of bed.  She reports her pain is intermittent, unchanged by drinking fluids and changing positions and worsening over time.  She reports a recent history, since 35 weeks, of tingling and numbness of both legs, especially when she first gets out of bed, that resolves over the course of the day.  Nothing seems to make it better or worse except time.  She got out of bed this morning and when she stepped onto the floor, her leg gave out from under her and she fell down onto her buttocks next to the bed. She did not hit her abdomen or any other part of her body during the fall.  She came in to be evaluated for the pelvic pressure that seems to be worse since her fall.   She reports good fetal movement, denies LOF, vaginal bleeding, vaginal itching/burning, urinary symptoms, h/a, dizziness, n/v, or fever/chills.     Clinic [facility] Prenatal Labs  Dating  u/s Blood type: O/Positive/-- (02/15 0920)   Genetic Screen 1 Screen:    AFP:     Quad:     NIPS: Antibody:Negative (02/15 0920)  Anatomic US  Rubella: 2.34 (02/15 0920)  GTT Early:               Third trimester:  RPR: Non Reactive (02/15 0920)   Flu vaccine declines HBsAg: Negative (02/15 0920)   TDaP vaccine    12/23/16                                           Rhogam: HIV: Non Reactive (02/15 0920)   Baby Food      breast                                         UJW:JXBJYNWGGBS:Negative (04/07 0000)(For PCN allergy, check sensitivities)  Contraception undecided Pap: neg  Circumcision na   [facility]   Support Person [facility]     OB History    Gravida Para Term Preterm AB Living   3 2 2  0 0 2   SAB TAB Ectopic Multiple Live Births   0 0 0 0  2     Past Medical History:  Diagnosis Date  . Benign heart murmur   . Gestational diabetes   . Peptic ulcer   . Ulcer    stomach (no longer taking meds)   Past Surgical History:  Procedure Laterality Date  . MYRINGOTOMY     with tubes  . UPPER GASTROINTESTINAL ENDOSCOPY    . WISDOM TOOTH EXTRACTION     Family History: family history includes Diabetes in her mother; Heart disease in her father; Hypertension in her father and mother. Social History:  reports that she quit smoking about 4 years ago. Her smoking use included Cigarettes. She has never used smokeless tobacco. She reports that she drinks alcohol. She reports that she does not use drugs.     Maternal Diabetes: Yes:  Diabetes Type:  Diet controlled Genetic Screening: Normal Maternal Ultrasounds/Referrals:  Normal Fetal Ultrasounds or other Referrals:  None Maternal Substance Abuse:  No Significant Maternal Medications:  None Significant Maternal Lab Results:  Lab values include: Group B Strep negative Other Comments:  None  Review of Systems  Constitutional: Negative for chills, fever and malaise/fatigue.  Eyes: Negative for blurred vision.  Respiratory: Negative for cough and shortness of breath.   Cardiovascular: Negative for chest pain.  Gastrointestinal: Negative for heartburn and vomiting.  Genitourinary: Negative for dysuria, frequency and urgency.  Musculoskeletal: Negative.   Neurological: Negative for dizziness and headaches.  Psychiatric/Behavioral: Negative for depression.   Maternal Medical History:  Reason for admission: IOL for GDM  Contractions: Onset was 6-12 hours ago.   Frequency: irregular.   Perceived severity is moderate.    Fetal activity: Perceived fetal activity is normal.   Last perceived fetal movement was within the past hour.    Prenatal complications: no prenatal complications Prenatal Complications - Diabetes: gestational. Diabetes is managed by diet.      Dilation:  1 Effacement (%): 60 Station: Ballotable Exam by:: Leafy Ro, RNC Blood pressure 125/78, pulse (!) 113, temperature 98.2 F (36.8 C), temperature source Oral, resp. rate 18, last menstrual period 05/05/2016, SpO2 99 %. Maternal Exam:  Uterine Assessment: Contraction strength is mild.  Contraction frequency is rare.   Abdomen: Patient reports no abdominal tenderness. Estimated fetal weight is 66%tile at 35 weeks.   Fetal presentation: vertex  Cervix: Cervix evaluated by digital exam.     Fetal Exam Fetal Monitor Review: Mode: ultrasound.   Baseline rate: 140.  Variability: moderate (6-25 bpm).   Pattern: accelerations present.    Fetal State Assessment: Category I - tracings are normal.     Physical Exam  Nursing note and vitals reviewed. Constitutional: She is oriented to person, place, and time. She appears well-developed and well-nourished.  Neck: Normal range of motion.  Cardiovascular: Normal rate and regular rhythm.   Respiratory: Effort normal and breath sounds normal.  GI: Soft.  Musculoskeletal: Normal range of motion.  Neurological: She is alert and oriented to person, place, and time.  Skin: Skin is warm and dry.  Psychiatric: She has a normal mood and affect. Her behavior is normal. Judgment and thought content normal.    Prenatal labs: ABO, Rh: O/Positive/-- (02/15 0920) Antibody: Negative (02/15 0920) Rubella: 2.34 (02/15 0920) RPR: Non Reactive (02/15 0920)  HBsAg: Negative (02/15 0920)  HIV: Non Reactive (02/15 0920)  GBS: Negative (04/07 0000)   Assessment/Plan: IUP @ [redacted]w[redacted]d by 5 week [place] GDM--diet controlled GBS negative   Admit to BS for IOL CBG on admission Cervical ripening then Pitocin Anticipate NSVD    Sharen Counter 03/14/2017, 2:28 PM

## 2017-03-14 NOTE — Progress Notes (Signed)
Patient ID: Zoe LanDansirae L Scotti, female   DOB: 1986/06/25, 31 y.o.   MRN: [id number] Pain not relieved by Fentanyl Coping well Wants epidural  Vitals:   03/14/17 1836 03/14/17 1923 03/14/17 2008 03/14/17 2105  BP: (!) 147/87 (!) 146/70 (!) 150/86 138/60  Pulse: (!) 102 97 91 100  Resp:  18 18 18   Temp:  98.5 F (36.9 C)    TempSrc:  Oral    SpO2:      Weight:      Height:       CBG 94  FHR reassuring Average variability + accels No decels  Dilation: 3.5 Effacement (%): 50 Cervical Position: Anterior Station: -3 Presentation: Vertex Exam by:: Artelia LarocheM. Miryam Mcelhinney CNM  Foley out  Will start Pitocin

## 2017-03-14 NOTE — Progress Notes (Signed)
LABOR PROGRESS NOTE  Tanya Terry is a 31 y.o. G3P2002 at 3963w2d  admitted for IOL 2/2 to GDM and recent fall.  Subjective: Patient complains of mild headache and some vision changes but denies RUQ pain or edema.  Objective: BP 128/83   Pulse 95   Temp 98.7 F (37.1 C) (Oral)   Resp 16   Ht 5\' 6"  (1.676 m)   Wt (!) 316 lb (143.3 kg)   LMP 05/05/2016 (Exact Date)   SpO2 100%   BMI 51.00 kg/m  or  Vitals:   03/14/17 0937 03/14/17 1508 03/14/17 1556 03/14/17 1728  BP: 125/78 121/72 (!) 141/81 128/83  Pulse: (!) 113 100 92 95  Resp: 18 18 16    Temp: 98.2 F (36.8 C)  98.7 F (37.1 C)   TempSrc: Oral  Oral   SpO2: 99%  100%   Weight:   (!) 316 lb (143.3 kg)   Height:   5\' 6"  (1.676 m)     Dilation: 1 Effacement (%): 50 Cervical Position: Anterior Station: -3 Presentation: Vertex Exam by:: Melanie CNM  Labs: Lab Results  Component Value Date   WBC 11.6 (H) 12/23/2016   HGB 14.1 11/15/2016   HCT 37.8 12/23/2016   MCV 92 12/23/2016   PLT 245 12/23/2016    Patient Active Problem List   Diagnosis Date Noted  . Gestational diabetes mellitus (GDM) affecting third pregnancy 03/14/2017  . Supervision of high risk pregnancy, antepartum 02/17/2017  . Gestational diabetes 01/20/2017    Assessment / Plan: 31 y.o. G3P2002 at 4563w2d here for IOL in the setting of GDM and recent trauma. Foley bulb placed @1745  with cytotec, will continue with induction and augmentation process.  Labor: Induction and augmentation Fetal Wellbeing:  Cat 1 Pain Control:  IV pain meds, epidural upon request Anticipated MOD:  Vaginal   Tanya NeighboursAbdoulaye Foster Frericks, MD 03/14/2017, 5:56 PM

## 2017-03-14 NOTE — Progress Notes (Signed)
Patient states she fell today at 0400 when getting up to use the bathroom.  She felt the pain of a contraction and felt numbness in her legs and fell to the ground landing on her buttocks.  Denies any vaginal bleeding, LOF or increased pain after the fall.  Had had decreased fetal movement since last night, no change after the fall.

## 2017-03-14 NOTE — MAU Note (Signed)
Having pain and pressure.. Pain started last night. Denies any leaking or bleeding.  Gest diabetic

## 2017-03-15 ENCOUNTER — Encounter (HOSPITAL_COMMUNITY): Payer: Self-pay | Admitting: *Deleted

## 2017-03-15 DIAGNOSIS — Z3A40 40 weeks gestation of pregnancy: Secondary | ICD-10-CM

## 2017-03-15 DIAGNOSIS — O24420 Gestational diabetes mellitus in childbirth, diet controlled: Secondary | ICD-10-CM

## 2017-03-15 LAB — RPR: RPR Ser Ql: NONREACTIVE

## 2017-03-15 LAB — GLUCOSE, CAPILLARY
GLUCOSE-CAPILLARY: 85 mg/dL (ref 65–99)
GLUCOSE-CAPILLARY: 95 mg/dL (ref 65–99)
Glucose-Capillary: 87 mg/dL (ref 65–99)

## 2017-03-15 MED ORDER — ONDANSETRON HCL 4 MG PO TABS: 4.0000 mg | ORAL_TABLET | ORAL | Status: DC | PRN

## 2017-03-15 MED ORDER — PRENATAL MULTIVITAMIN CH
1.0000 | ORAL_TABLET | Freq: Every day | ORAL | Status: DC
  Administered 2017-03-16 – 2017-03-17 (×2): 1 via ORAL
  Filled 2017-03-15 (×2): qty 1

## 2017-03-15 MED ORDER — BENZOCAINE-MENTHOL 20-0.5 % EX AERO
1.0000 "application " | INHALATION_SPRAY | CUTANEOUS | Status: DC | PRN
  Administered 2017-03-15: 1 via TOPICAL
  Filled 2017-03-15: qty 56

## 2017-03-15 MED ORDER — WITCH HAZEL-GLYCERIN EX PADS: 1.0000 "application " | MEDICATED_PAD | CUTANEOUS | Status: DC | PRN

## 2017-03-15 MED ORDER — TETANUS-DIPHTH-ACELL PERTUSSIS 5-2.5-18.5 LF-MCG/0.5 IM SUSP: 0.5000 mL | Freq: Once | INTRAMUSCULAR | Status: DC

## 2017-03-15 MED ORDER — DIPHENHYDRAMINE HCL 25 MG PO CAPS: 25.0000 mg | ORAL_CAPSULE | Freq: Four times a day (QID) | ORAL | Status: DC | PRN

## 2017-03-15 MED ORDER — IBUPROFEN 600 MG PO TABS
600.0000 mg | ORAL_TABLET | Freq: Four times a day (QID) | ORAL | Status: DC
  Administered 2017-03-15 – 2017-03-17 (×8): 600 mg via ORAL
  Filled 2017-03-15 (×8): qty 1

## 2017-03-15 MED ORDER — COCONUT OIL OIL: 1.0000 "application " | TOPICAL_OIL | Status: DC | PRN

## 2017-03-15 MED ORDER — ACETAMINOPHEN 325 MG PO TABS: 650.0000 mg | ORAL_TABLET | ORAL | Status: DC | PRN

## 2017-03-15 MED ORDER — SIMETHICONE 80 MG PO CHEW: 80.0000 mg | CHEWABLE_TABLET | ORAL | Status: DC | PRN

## 2017-03-15 MED ORDER — SENNOSIDES-DOCUSATE SODIUM 8.6-50 MG PO TABS
2.0000 | ORAL_TABLET | ORAL | Status: DC
  Administered 2017-03-16 – 2017-03-17 (×2): 2 via ORAL
  Filled 2017-03-15 (×2): qty 2

## 2017-03-15 MED ORDER — ZOLPIDEM TARTRATE 5 MG PO TABS: 5.0000 mg | ORAL_TABLET | Freq: Every evening | ORAL | Status: DC | PRN

## 2017-03-15 MED ORDER — DIBUCAINE 1 % RE OINT: 1.0000 "application " | TOPICAL_OINTMENT | RECTAL | Status: DC | PRN

## 2017-03-15 MED ORDER — ONDANSETRON HCL 4 MG/2ML IJ SOLN: 4.0000 mg | INTRAMUSCULAR | Status: DC | PRN

## 2017-03-15 NOTE — Progress Notes (Signed)
Patient ID: Zoe LanDansirae L Groseclose, female   DOB: [birth year], 31 y.o.   MRN: [id number] Vitals:   03/14/17 2359 03/15/17 0000 03/15/17 0030 03/15/17 0100  BP:  (!) 103/47 (!) 100/48 115/73  Pulse:  95 88 (!) 106  Resp:   16 16  Temp: 98.4 F (36.9 C)     TempSrc: Oral     SpO2:      Weight:      Height:       FHR stable, Category 2 Average variability 10 min ago did have accels Now nonreactive  UCs every 2-3 min Dilation: 4 Effacement (%): 80 Cervical Position: Anterior Station: -2 Presentation: Vertex Exam by:: Malva CoganA. Schwarz RN

## 2017-03-15 NOTE — Anesthesia Postprocedure Evaluation (Signed)
Anesthesia Post Note  Patient: Tanya Terry  Procedure(s) Performed: * No procedures listed *  Patient location during evaluation: Mother Baby Anesthesia Type: Epidural Level of consciousness: awake and alert and oriented Pain management: satisfactory to patient Vital Signs Assessment: post-procedure vital signs reviewed and stable Respiratory status: spontaneous breathing and nonlabored ventilation Cardiovascular status: stable Postop Assessment: no headache, no backache, no signs of nausea or vomiting, adequate PO intake and patient able to bend at knees (patient up walking) Anesthetic complications: no        Last Vitals:  Vitals:   03/15/17 1100 03/15/17 1217  BP: 129/63 112/69  Pulse: (!) 112 86  Resp: 20 18  Temp: (!) 38.3 C 37.3 C    Last Pain:  Vitals:   03/15/17 1217  TempSrc: Axillary  PainSc:    Pain Goal: Patients Stated Pain Goal: 4 (03/14/17 1556)               Madison HickmanGREGORY,Oline Belk

## 2017-03-15 NOTE — Progress Notes (Signed)
At [phone], RNs entered room to reposition patient and adjust monitor. When lifting leg, baby noted to be coming out. Mother unaware. Emergency button pressed, and team entered room shortly after.  Tanya Terry ParentsBrown Marsia Cino, RN   9:50 AM    03/15/2017

## 2017-03-15 NOTE — Progress Notes (Signed)
[name], female   DOB: 04/11/1986, 31 y.o.   MRN: [id number] Vitals:   03/15/17 0130 03/15/17 0200 03/15/17 0230 03/15/17 0300  BP: (!) 93/50 (!) 111/38 125/75 132/75  Pulse: 85 96 98 95  Resp: 16 16 16 16   Temp:      TempSrc:      SpO2:      Weight:      Height:       FHR stable UCs not tracing well  Dilation: 5.5 Effacement (%): 100 Cervical Position: Anterior Station: -1 Presentation: Vertex Exam by:: Artelia LarocheM. Shafin Pollio CNM   AROM clear fluid for augmentation of labor and insertion of IUPC IUPC placed   Will advance pitocin per IUPC

## 2017-03-15 NOTE — Progress Notes (Signed)
Dr. Omer JackMumaw notified of patient having a temperature of 100.9 upon admission. She said to recheck in an hour and if still high to call her back.

## 2017-03-15 NOTE — Lactation Note (Signed)
This note was copied from a baby's chart. Lactation Consultation Note  Patient Name: Tanya Terry WUJWJ'XToday's Date: 03/15/2017 Reason for consult: Initial assessment   Initial assessment with mom of 6 hour old infant. Infant with 2 BF for 10 minutes, 2 attempts, 2 voids and 1 stool since birth. Infant weight 7 lb 13.8 oz. Maternal history of GDM-Diet controlled. Infant CBG's 47 & 45.  Mom reports infant fed well after delivery. Mom reports infant has been sleepy since. Mom was given several pillows to use during BF. Infant was awakened by RN assessment, mom placed her STS and infant began rooting, mom attempted to latch infant to right breast in the cradle hold, infant did not latch. Enc mom to keep infant STS as much as possible and to BF infant STS 8-12 x in 24 hours at first feeding cues using both breasts with each feeding. Enc mom to use pillow and head support with feeding. Enc mom to hand express before and after latching and to massage/compress breast with feedings. Mom voiced understanding.   BF Resources Handout and LC Brochure given, mom informed of IP/OP Services, BF Support Groups and LC phone #. Enc mom to call out for feeding assistance as needed. Mom has a pump at home, she is unsure of what brand. [facility] and [facility] to call post d/c to make an appointment.    Maternal Data Formula Feeding for Exclusion: No Has patient been taught Hand Expression?: Yes Does the patient have breastfeeding experience prior to this delivery?: Yes  Feeding Feeding Type: Breast Fed Length of feed: 0 min  LATCH Score/Interventions Latch: Too sleepy or reluctant, no latch achieved, no sucking elicited. Intervention(s): Skin to skin;Teach feeding cues;Waking techniques  Audible Swallowing: None Intervention(s): Skin to skin;Hand expression  Type of Nipple: Everted at rest and after stimulation  Comfort (Breast/Nipple): Soft / non-tender     Hold (Positioning): No assistance  needed to correctly position infant at breast. Intervention(s): Breastfeeding basics reviewed;Support Pillows;Position options;Skin to skin  LATCH Score: 6  Lactation Tools Discussed/Used WIC Program: Yes   Consult Status Consult Status: Follow-up Date: 03/16/17 Follow-up type: In-patient    Tanya Terry 03/15/2017, 4:26 PM

## 2017-03-15 NOTE — Lactation Note (Signed)
This note was copied from a baby's chart. Lactation Consultation Note  Patient Name: Girl Iona HansenDansirae Montufar ZOXWR'UToday's Date: 03/15/2017 Reason for consult: Follow-up assessment Baby at 8 hr of life. Mom called for latch help. Upon entry mom was holding a swaddled baby in a modified cross cradle hold. Mom has large soft breast and stated this position is most comfortably for her. With some effort baby latched deeply, a few swallows were noted. Demonstrated manual expression, colostrum noted bilaterally. Suggested spoon feeding if baby continues to struggle with latch. Mom is aware of lactation services and support group.    Maternal Data Formula Feeding for Exclusion: No Has patient been taught Hand Expression?: Yes Does the patient have breastfeeding experience prior to this delivery?: Yes  Feeding Feeding Type: Breast Fed Length of feed: 15 min  LATCH Score/Interventions Latch: Repeated attempts needed to sustain latch, nipple held in mouth throughout feeding, stimulation needed to elicit sucking reflex. Intervention(s): Skin to skin;Teach feeding cues;Waking techniques Intervention(s): Adjust position;Assist with latch;Breast compression  Audible Swallowing: A few with stimulation Intervention(s): Hand expression;Skin to skin Intervention(s): Alternate breast massage  Type of Nipple: Everted at rest and after stimulation  Comfort (Breast/Nipple): Soft / non-tender     Hold (Positioning): Assistance needed to correctly position infant at breast and maintain latch. Intervention(s): Position options;Support Pillows  LATCH Score: 7  Lactation Tools Discussed/Used WIC Program: Yes   Consult Status Consult Status: Follow-up Date: 03/16/17 Follow-up type: In-patient    Rulon Eisenmengerlizabeth E Elia Nunley 03/15/2017, 5:41 PM

## 2017-03-16 MED ORDER — OXYCODONE HCL 5 MG PO TABS
5.0000 mg | ORAL_TABLET | ORAL | Status: DC | PRN
  Administered 2017-03-16 (×2): 5 mg via ORAL
  Filled 2017-03-16 (×2): qty 1

## 2017-03-16 MED ORDER — OXYCODONE-ACETAMINOPHEN 5-325 MG PO TABS
1.0000 | ORAL_TABLET | ORAL | Status: DC | PRN
  Administered 2017-03-16: 1 via ORAL
  Filled 2017-03-16: qty 1

## 2017-03-16 MED ORDER — FAMOTIDINE 20 MG PO TABS
20.0000 mg | ORAL_TABLET | Freq: Two times a day (BID) | ORAL | Status: DC
  Administered 2017-03-16 – 2017-03-17 (×2): 20 mg via ORAL
  Filled 2017-03-16 (×3): qty 1

## 2017-03-16 NOTE — Progress Notes (Signed)
Post Partum Day 2 Subjective: complaining of abdominal cramping and status post fall ,able to ambulate,breastfeeding going well.  Objective: Blood pressure (!) 106/57, pulse 90, temperature 97.9 F (36.6 C), resp. rate 20, height 5\' 6"  (1.676 m), weight (!) 316 lb (143.3 kg), last menstrual period 05/05/2016, SpO2 96 %, unknown if currently breastfeeding.  Physical Exam:  General: alert, cooperative, appears stated age, no distress and moderately obese Lochia: appropriate Uterine Fundus: firm Incision: none DVT Evaluation: No evidence of DVT seen on physical exam. Calf/Ankle edema is present. +1 bilateral plantar edema   Recent Labs  03/14/17 1715  HGB 13.1  HCT 38.0    Assessment/Plan: Plan for discharge tomorrow, Breastfeeding and Contraception undecided at this time.    LOS: 2 days   Rondel JumboShanika Dammon Makarewicz ,SNM 03/16/2017, 7:38 AM

## 2017-03-17 MED ORDER — IBUPROFEN 600 MG PO TABS: 600.0000 mg | ORAL_TABLET | Freq: Four times a day (QID) | ORAL | 2 refills | Status: DC

## 2017-03-17 MED ORDER — OXYCODONE HCL 5 MG PO TABS: 5.0000 mg | ORAL_TABLET | ORAL | 0 refills | Status: DC | PRN

## 2017-03-17 MED ORDER — SENNOSIDES-DOCUSATE SODIUM 8.6-50 MG PO TABS: 2.0000 | ORAL_TABLET | Freq: Every day | ORAL | 2 refills | Status: DC

## 2017-03-17 NOTE — Discharge Summary (Signed)
OB Discharge Summary     Patient Name: Tanya Terry DOB: [birth year] MRN: [id number]  Date of admission: 03/14/2017 Delivering MD: Marjo Bicker B   Date of discharge: 03/17/2017  Admitting diagnosis: 40WKS, PRESSURE,PAIN Intrauterine pregnancy: [redacted]w[redacted]d     Secondary diagnosis:  Active Problems:   Gestational diabetes mellitus (GDM) affecting third pregnancy  Additional problems: GDMA1, S/P fall at home with muscle strain at 40wks     Discharge diagnosis: Term Pregnancy Delivered and GDM A1                                                                                                Post partum procedures:none  Augmentation: AROM, Pitocin, Cytotec and Foley Balloon  Complications: None  Hospital course:  Induction of Labor With Vaginal Delivery   31 y.o. yo G3P3003 at [redacted]w[redacted]d was admitted to the hospital 03/14/2017 for induction of labor.  Indication for induction: A1 DM and s/p fall.  Patient had an uncomplicated labor course as follows: Membrane Rupture Time/Date: 3:37 AM ,03/15/2017   Intrapartum Procedures: Episiotomy: None [1]                                         Lacerations:  None [1]  Patient had delivery of a Viable infant.  Information for the patient's newborn:  Tanya, Terry [[id number]]  Delivery Method: Vag-Spont   03/15/2017  Details of delivery can be found in separate delivery note.  Patient had a routine postpartum course. Patient is discharged home 03/17/17. Admission H&P: Tanya Terry is a 31 y.o. female G3P2002 at [redacted]w[redacted]d pt of [facility] admitted for IOL for GDM--diet controlled.  She presented today to maternity admissions reporting abdominal cramping and pelvic pressure that is intermittent x 2-3 days but worsening today, and a fall this morning when getting out of bed.  She reports her pain is intermittent, unchanged by drinking fluids and changing positions and worsening over time.  She reports a recent history, since 35 weeks, of tingling and  numbness of both legs, especially when she first gets out of bed, that resolves over the course of the day.  Nothing seems to make it better or worse except time.  She got out of bed this morning and when she stepped onto the floor, her leg gave out from under her and she fell down onto her buttocks next to the bed. She did not hit her abdomen or any other part of her body during the fall.  She came in to be evaluated for the pelvic pressure that seems to be worse since her fall.   Physical exam  Vitals:   03/15/17 2209 03/16/17 0459 03/16/17 1837 03/17/17 0700  BP: 116/60 (!) 106/57 130/80 122/70  Pulse: 88 90 77 76  Resp: 18 20 20 18   Temp: 98.5 F (36.9 C) 97.9 F (36.6 C) 98.1 F (36.7 C) 98 F (36.7 C)  TempSrc: Oral  Oral   SpO2:      Weight:  Height:       General: alert, cooperative and no distress Lochia: appropriate Uterine Fundus: firm Incision: N/A DVT Evaluation: No evidence of DVT seen on physical exam. No cords or calf tenderness. No significant calf/ankle edema. Labs: Lab Results  Component Value Date   WBC 13.5 (H) 03/14/2017   HGB 13.1 03/14/2017   HCT 38.0 03/14/2017   MCV 88.8 03/14/2017   PLT  03/14/2017    PLATELET CLUMPS NOTED ON SMEAR, COUNT APPEARS ADEQUATE   CMP Latest Ref Rng & Units 03/14/2017  Glucose 65 - 99 mg/dL 86  BUN 6 - 20 mg/dL 6  Creatinine 1.300.44 - 8.651.00 mg/dL 7.840.50  Sodium 696135 - 295145 mmol/L 136  Potassium 3.5 - 5.1 mmol/L 3.4(L)  Chloride 101 - 111 mmol/L 106  CO2 22 - 32 mmol/L 21(L)  Calcium 8.9 - 10.3 mg/dL 2.8(U8.8(L)  Total Protein 6.5 - 8.1 g/dL 6.5  Total Bilirubin 0.3 - 1.2 mg/dL 0.6  Alkaline Phos 38 - 126 U/L 126  AST 15 - 41 U/L 17  ALT 14 - 54 U/L 13(L)    Discharge instruction: per After Visit Summary and "Baby and Me Booklet".  After visit meds:  Allergies as of 03/17/2017      Reactions   Augmentin [amoxicillin-pot Clavulanate] Anaphylaxis, Shortness Of Breath, Swelling   Has patient had a PCN reaction causing  immediate rash, facial/tongue/throat swelling, SOB or lightheadedness with hypotension: Yes Has patient had a PCN reaction causing severe rash involving mucus membranes or skin necrosis: Yes Has patient had a PCN reaction that required hospitalization No Has patient had a PCN reaction occurring within the last 10 years: Yes If all of the above answers are "NO", then may proceed with Cephalosporin use.   Sulfa Antibiotics Anaphylaxis, Shortness Of Breath, Swelling      Medication List    STOP taking these medications   ranitidine 150 MG tablet Commonly known as:  ZANTAC     TAKE these medications   acetaminophen 500 MG tablet Commonly known as:  TYLENOL Take 500 mg by mouth every 6 (six) hours as needed for mild pain or moderate pain.   clindamycin 300 MG capsule Commonly known as:  CLEOCIN Take 1 capsule (300 mg total) by mouth 3 (three) times daily.   clotrimazole 1 % cream Commonly known as:  LOTRIMIN Apply 1 application topically 2 (two) times daily.   hydrocortisone cream 0.5 % Apply 1 application topically 2 (two) times daily.   ibuprofen 600 MG tablet Commonly known as:  ADVIL,MOTRIN Take 1 tablet (600 mg total) by mouth every 6 (six) hours.   ONE-A-DAY WOMENS PRENATAL PO Take 2 each by mouth daily. gummy   oxyCODONE 5 MG immediate release tablet Commonly known as:  Oxy IR/ROXICODONE Take 1 tablet (5 mg total) by mouth every 4 (four) hours as needed for severe pain.   senna-docusate 8.6-50 MG tablet Commonly known as:  Senokot-S Take 2 tablets by mouth at bedtime.       Diet: routine diet  Activity: Advance as tolerated. Pelvic rest for 6 weeks.   Outpatient follow up:6 weeks for 2 hour OGTT and as scheduled for contraception.  Follow up Appt: Future Appointments Date Time Provider Department Center  04/12/2017 9:30 AM Brock BadHarper, Charles A, MD [facility] None   Follow up Visit:No Follow-up on file.  Postpartum contraception: Undecided  Newborn Data: Live  born female  Birth Weight: 7 lb 13.8 oz (3565 g) APGAR: 8, 9  Baby Feeding: Bottle and Breast  Disposition:home with mother   03/17/2017 Roe Coombs, CNM

## 2017-03-17 NOTE — Progress Notes (Signed)
Post Partum Day 2 Subjective: no complaints, up ad lib, voiding and tolerating PO  Objective: Blood pressure 122/70, pulse 76, temperature 98 F (36.7 C), resp. rate 18, height 5\' 6"  (1.676 m), weight (!) 316 lb (143.3 kg), last menstrual period 05/05/2016, SpO2 96 %, , was breast feeding but patient is currently bottle feeding.at this time. Physical Exam:  General: alert, cooperative, appears stated age and moderately obese Lochia: appropriate Uterine Fundus: firm Incision: none DVT Evaluation: No evidence of DVT seen on physical exam. Calf/Ankle edema is present. +1 pedal edema noted   Recent Labs  03/14/17 1715  HGB 13.1  HCT 38.0    Assessment/Plan: Discharge home ,infant bilirubin is elevated, f/up in office 4 week postpartum and Contraception undecided at this time.   LOS: 3 days   Shanika Creacy,SNM 03/17/2017, 7:56 AM

## 2017-03-18 ENCOUNTER — Encounter: Payer: Medicaid Other | Admitting: Obstetrics

## 2017-04-07 ENCOUNTER — Encounter: Payer: Self-pay | Admitting: Obstetrics

## 2017-04-07 ENCOUNTER — Ambulatory Visit (INDEPENDENT_AMBULATORY_CARE_PROVIDER_SITE_OTHER): Payer: Medicaid Other | Admitting: Obstetrics

## 2017-04-07 DIAGNOSIS — O99345 Other mental disorders complicating the puerperium: Secondary | ICD-10-CM

## 2017-04-07 DIAGNOSIS — F53 Postpartum depression: Secondary | ICD-10-CM

## 2017-04-07 DIAGNOSIS — G44211 Episodic tension-type headache, intractable: Secondary | ICD-10-CM

## 2017-04-07 MED ORDER — CYCLOBENZAPRINE HCL 10 MG PO TABS: 10.0000 mg | ORAL_TABLET | Freq: Three times a day (TID) | ORAL | 2 refills | Status: DC | PRN

## 2017-04-07 MED ORDER — ESCITALOPRAM OXALATE 10 MG PO TABS: 10.0000 mg | ORAL_TABLET | Freq: Every day | ORAL | 5 refills | Status: DC

## 2017-04-07 MED ORDER — BUTALBITAL-APAP-CAFFEINE 50-325-40 MG PO TABS: 2.0000 | ORAL_TABLET | Freq: Four times a day (QID) | ORAL | 2 refills | Status: AC | PRN

## 2017-04-07 NOTE — Patient Instructions (Addendum)
Postpartum Depression and Baby Blues The postpartum period begins right after the birth of a baby. During this time, there is often a great amount of joy and excitement. It is also a time of many changes in the life of the parents. Regardless of how many times a mother gives birth, each child brings new challenges and dynamics to the family. It is not unusual to have feelings of excitement along with confusing shifts in moods, emotions, and thoughts. All mothers are at risk of developing postpartum depression or the "baby blues." These mood changes can occur right after giving birth, or they may occur many months after giving birth. The baby blues or postpartum depression can be mild or severe. Additionally, postpartum depression can go away rather quickly, or it can be a long-term condition. What are the causes? Raised hormone levels and the rapid drop in those levels are thought to be a main cause of postpartum depression and the baby blues. A number of hormones change during and after pregnancy. Estrogen and progesterone usually decrease right after the delivery of your baby. The levels of thyroid hormone and various cortisol steroids also rapidly drop. Other factors that play a role in these mood changes include major life events and genetics. What increases the risk? If you have any of the following risks for the baby blues or postpartum depression, know what symptoms to watch out for during the postpartum period. Risk factors that may increase the likelihood of getting the baby blues or postpartum depression include:  Having a personal or family history of depression.  Having depression while being pregnant.  Having premenstrual mood issues or mood issues related to oral contraceptives.  Having a lot of life stress.  Having marital conflict.  Lacking a social support network.  Having a baby with special needs.  Having health problems, such as diabetes.  What are the signs or  symptoms? Symptoms of baby blues include:  Brief changes in mood, such as going from extreme happiness to sadness.  Decreased concentration.  Difficulty sleeping.  Crying spells, tearfulness.  Irritability.  Anxiety.  Symptoms of postpartum depression typically begin within the first month after giving birth. These symptoms include:  Difficulty sleeping or excessive sleepiness.  Marked weight loss.  Agitation.  Feelings of worthlessness.  Lack of interest in activity or food.  Postpartum psychosis is a very serious condition and can be dangerous. Fortunately, it is rare. Displaying any of the following symptoms is cause for immediate medical attention. Symptoms of postpartum psychosis include:  Hallucinations and delusions.  Bizarre or disorganized behavior.  Confusion or disorientation.  How is this diagnosed? A diagnosis is made by an evaluation of your symptoms. There are no medical or lab tests that lead to a diagnosis, but there are various questionnaires that a health care provider may use to identify those with the baby blues, postpartum depression, or psychosis. Often, a screening tool called the Edinburgh Postnatal Depression Scale is used to diagnose depression in the postpartum period. How is this treated? The baby blues usually goes away on its own in 1-2 weeks. Social support is often all that is needed. You will be encouraged to get adequate sleep and rest. Occasionally, you may be given medicines to help you sleep. Postpartum depression requires treatment because it can last several months or longer if it is not treated. Treatment may include individual or group therapy, medicine, or both to address any social, physiological, and psychological factors that may play a role in the   depression. Regular exercise, a healthy diet, rest, and social support may also be strongly recommended. Postpartum psychosis is more serious and needs treatment right away.  Hospitalization is often needed. Follow these instructions at home:  Get as much rest as you can. Nap when the baby sleeps.  Exercise regularly. Some women find yoga and walking to be beneficial.  Eat a balanced and nourishing diet.  Do little things that you enjoy. Have a cup of tea, take a bubble bath, read your favorite magazine, or listen to your favorite music.  Avoid alcohol.  Ask for help with household chores, cooking, grocery shopping, or running errands as needed. Do not try to do everything.  Talk to people close to you about how you are feeling. Get support from your partner, family members, friends, or other new moms.  Try to stay positive in how you think. Think about the things you are grateful for.  Do not spend a lot of time alone.  Only take over-the-counter or prescription medicine as directed by your health care provider.  Keep all your postpartum appointments.  Let your health care provider know if you have any concerns. Contact a health care provider if: You are having a reaction to or problems with your medicine. Get help right away if:  You have suicidal feelings.  You think you may harm the baby or someone else. This information is not intended to replace advice given to you by your health care provider. Make sure you discuss any questions you have with your health care provider. Document Released: 07/29/2004 Document Revised: 04/01/2016 Document Reviewed: 08/06/2013 Elsevier Interactive Patient Education  2017 [employer]  

## 2017-04-07 NOTE — Progress Notes (Signed)
Patient is in the office for pp follow up, complains of headache in the back of her head/neck since delivery.   Marland Kitchen.Arlie Solomons.Post Partum Exam  Tanya Terry is a 31 y.o. [id number] female who presents for a postpartum visit. She is 3 weeks postpartum following a spontaneous vaginal delivery. I have fully reviewed the prenatal and intrapartum course. The delivery was at 40.3 gestational weeks.  Anesthesia: epidural. Postpartum course has been complicated by depression. Baby's course has been normal. Baby is feeding by both breast and bottle - Similac Advance. Bleeding no bleeding. Bowel function is normal. Bladder function is normal. Patient is not sexually active. Contraception method is abstinence. Postpartum depression screening:neg  The following portions of the patient's history were reviewed and updated as appropriate: allergies, current medications, past family history, past medical history, past social history, past surgical history and problem list.  Review of Systems A comprehensive review of systems was negative except for: Behavioral/Psych: positive for depression    Objective:  Blood pressure 108/70, pulse 73, height 5\' 7"  (1.702 m), weight 295 lb 9.6 oz (134.1 kg), currently breastfeeding.  PE:  Deferred  Assessment:    Postpartum Depression  Headaches - tension, occipital location.  Probably related to lack of sleep and depression  Plan:   1. Contraception: abstinence  2. Referred to [facility] and Lexapro Rx for postpartum depression 3. Fioricet Rx for HA's 4. Flexeril Rx for muscle spasm, neck area 5. Follow up in: 4 weeks or as needed.

## 2017-04-12 ENCOUNTER — Ambulatory Visit: Payer: Medicaid Other | Admitting: Obstetrics

## 2017-05-05 ENCOUNTER — Ambulatory Visit: Payer: Medicaid Other | Admitting: Obstetrics

## 2017-05-18 ENCOUNTER — Ambulatory Visit (INDEPENDENT_AMBULATORY_CARE_PROVIDER_SITE_OTHER): Payer: Medicaid Other | Admitting: Obstetrics

## 2017-05-18 ENCOUNTER — Encounter: Payer: Self-pay | Admitting: Obstetrics

## 2017-05-18 DIAGNOSIS — Z3009 Encounter for other general counseling and advice on contraception: Secondary | ICD-10-CM

## 2017-05-18 DIAGNOSIS — F53 Postpartum depression: Secondary | ICD-10-CM

## 2017-05-18 DIAGNOSIS — O99345 Other mental disorders complicating the puerperium: Secondary | ICD-10-CM

## 2017-05-18 NOTE — Progress Notes (Signed)
Patient is in the office for pp follow up, vaginal delivery on 03-15-17, bottle feeding.  Marland Kitchen.Tanya Terry.Post Partum Exam  Tanya Terry is a 31 y.o. [id number] female who presents for a postpartum visit. She is 9 weeks postpartum following a spontaneous vaginal delivery. I have fully reviewed the prenatal and intrapartum course. The delivery was at 40.3 gestational weeks.  Anesthesia: epidural. Postpartum course has been complicated by depression. Baby's course has been normal. Baby is feeding by bottle - Similac Advance. Bleeding no bleeding. Bowel function is normal. Bladder function is normal. Patient is sexually active. Contraception method is none. Postpartum depression screening:pos  The following portions of the patient's history were reviewed and updated as appropriate: allergies, current medications, past family history, past medical history, past social history, past surgical history and problem list.  Review of Systems A comprehensive review of systems was negative except for: Behavioral/Psych: positive for depression    Objective:  Blood pressure 107/72, pulse 76, height 5\' 7"  (1.702 m), weight 296 lb (134.3 kg), last menstrual period 05/02/2017, not currently breastfeeding.  General:  alert and no distress   Breasts:  inspection negative, no nipple discharge or bleeding, no masses or nodularity palpable  Lungs: clear to auscultation bilaterally  Heart:  regular rate and rhythm, S1, S2 normal, no murmur, click, rub or gallop  Abdomen: soft, non-tender; bowel sounds normal; no masses,  no organomegaly   Vulva:  normal  Vagina: normal vagina  Cervix:  no cervical motion tenderness  Corpus: normal size, contour, position, consistency, mobility, non-tender  Adnexa:  no mass, fullness, tenderness  Rectal Exam: Not performed.        Assessment:    Normal postpartum exam. Pap smear not done at today's visit.    Plan:   1. Contraception: none 2. Continue Lexapro and Counseling  3. Follow up  in: 4 months or as needed.

## 2017-08-03 ENCOUNTER — Emergency Department (HOSPITAL_COMMUNITY)
Admission: EM | Admit: 2017-08-03 | Discharge: 2017-08-03 | Disposition: A | Discharge status: Home / Self Care | Payer: Medicaid Other | Attending: Emergency Medicine | Admitting: Emergency Medicine

## 2017-08-03 ENCOUNTER — Encounter (HOSPITAL_COMMUNITY): Payer: Self-pay | Admitting: Emergency Medicine

## 2017-08-03 DIAGNOSIS — Z79899 Other long term (current) drug therapy: Secondary | ICD-10-CM | POA: Insufficient documentation

## 2017-08-03 DIAGNOSIS — Z87891 Personal history of nicotine dependence: Secondary | ICD-10-CM | POA: Insufficient documentation

## 2017-08-03 DIAGNOSIS — E119 Type 2 diabetes mellitus without complications: Secondary | ICD-10-CM | POA: Diagnosis not present

## 2017-08-03 DIAGNOSIS — K0889 Other specified disorders of teeth and supporting structures: Secondary | ICD-10-CM | POA: Insufficient documentation

## 2017-08-03 MED ORDER — DOXYCYCLINE HYCLATE 100 MG PO CAPS: 100.0000 mg | ORAL_CAPSULE | Freq: Two times a day (BID) | ORAL | 0 refills | Status: DC

## 2017-08-03 MED ORDER — BUPIVACAINE-EPINEPHRINE (PF) 0.5% -1:200000 IJ SOLN
10.0000 mL | Freq: Once | INTRAMUSCULAR | Status: AC
  Administered 2017-08-03: 10 mL
  Filled 2017-08-03: qty 30

## 2017-08-03 NOTE — ED Triage Notes (Signed)
Pt from home with c/o upper right gum swelling and dental pain x 1 week. Pt states she has not seen her dentist for this, but has been using oragel and other otc meds with no relief. Pt is not tachycardic nor febrile at time of assessment.

## 2017-08-03 NOTE — Discharge Instructions (Signed)
Please complete full course of antibiotics as prescribed. There is a free dental clinic at [facility] this Friday and Saturday, as well as the other dental resources provided. Please return to the emergency department if he developed fevers, chills, difficulty breathing, or other new or concerning symptoms.

## 2017-08-03 NOTE — ED Provider Notes (Signed)
[facility]-EMERGENCY DEPT Provider Note   CSN: [id number] Arrival date & time: 08/03/17  1410     History   Chief Complaint Chief Complaint  Patient presents with  . Dental Pain    HPI  Tanya Terry is a 31 y.o. female with a history of peptic ulcer disease, presents with right gum swelling and dental pain for the past week on right upper teeth. She describes pain as a constant ache that has not been improving over the past week. She has tried Orajel and Tylenol with no relief. Patient is unable to take NSAIDs due to previous gastric ulcers. She reports she's been unable to see a dentist for this because she is waiting for her insurance to kick in. Patient reports a few months ago she broke a tooth, and has been unable to get this fixed. Patient denies any fevers or chills. No difficulty swallowing or tolerating secretions. No shortness of breath or sensation of throat closing.      Past Medical History:  Diagnosis Date  . Benign heart murmur   . Gestational diabetes   . Peptic ulcer   . Ulcer    stomach (no longer taking meds)    Patient Active Problem List   Diagnosis Date Noted  . Gestational diabetes mellitus (GDM) affecting third pregnancy 03/14/2017  . Supervision of high risk pregnancy, antepartum 02/17/2017  . Gestational diabetes 01/20/2017    Past Surgical History:  Procedure Laterality Date  . MYRINGOTOMY     with tubes  . UPPER GASTROINTESTINAL ENDOSCOPY    . WISDOM TOOTH EXTRACTION      OB History    Gravida Para Term Preterm AB Living   0 0 3   SAB TAB Ectopic Multiple Live Births   0 0 0 0 3       Home Medications    Prior to Admission medications   Medication Sig Start Date End Date Taking? Authorizing Provider  acetaminophen (TYLENOL) 500 MG tablet Take 500 mg by mouth every 6 (six) hours as needed for mild pain or moderate pain.     [provider]  butalbital-acetaminophen-caffeine Marikay Alar, ESGIC) [phone] MG tablet Take  2 tablets by mouth every 6 (six) hours as needed for headache. 04/07/17 04/07/18  Brock Bad, MD  clindamycin (CLEOCIN) 300 MG capsule Take 1 capsule (300 mg total) by mouth 3 (three) times daily. Patient not taking: Reported on 04/07/2017 03/03/17   Brock Bad, MD  clotrimazole (LOTRIMIN) 1 % cream Apply 1 application topically 2 (two) times daily. Patient not taking: Reported on 03/14/2017 01/27/17   Brock Bad, MD  cyclobenzaprine (FLEXERIL) 10 MG tablet Take 1 tablet (10 mg total) by mouth every 8 (eight) hours as needed for muscle spasms. 04/07/17   Brock Bad, MD  doxycycline (VIBRAMYCIN) 100 MG capsule Take 1 capsule (100 mg total) by mouth 2 (two) times daily. One po bid x 7 days 08/03/17   Dartha Lodge, PA-C  escitalopram (LEXAPRO) 10 MG tablet Take 1 tablet (10 mg total) by mouth daily. 04/07/17   Brock Bad, MD  hydrocortisone cream 0.5 % Apply 1 application topically 2 (two) times daily. Patient not taking: Reported on 03/14/2017 01/27/17   Brock Bad, MD  ibuprofen (ADVIL,MOTRIN) 600 MG tablet Take 1 tablet (600 mg total) by mouth every 6 (six) hours. Patient not taking: Reported on 05/18/2017 03/17/17   Orvilla Cornwall A, CNM  oxyCODONE (OXY IR/ROXICODONE) 5 MG immediate release  tablet Take 1 tablet (5 mg total) by mouth every 4 (four) hours as needed for severe pain. Patient not taking: Reported on 05/18/2017 03/17/17   Roe Coombs, CNM  Prenatal Vit-Fe Fum-FA-Omega (ONE-A-DAY WOMENS PRENATAL PO) Take 2 each by mouth daily. gummy    [provider]  senna-docusate (SENOKOT-S) 8.6-50 MG tablet Take 2 tablets by mouth at bedtime. Patient not taking: Reported on 04/07/2017 03/17/17   Roe Coombs, CNM    Family History Family History  Problem Relation Age of Onset  . Diabetes Mother   . Hypertension Mother   . Heart disease Father   . Hypertension Father   . Anesthesia problems Neg Hx   . Hypotension Neg Hx   . Malignant  hyperthermia Neg Hx   . Pseudochol deficiency Neg Hx     Social History Social History  Substance Use Topics  . Smoking status: Former Smoker    Types: Cigarettes    Quit date: 10/04/2012  . Smokeless tobacco: Former Neurosurgeon  . Alcohol use Yes     Comment: not while pregnant     Allergies   Augmentin [amoxicillin-pot clavulanate] and Sulfa antibiotics   Review of Systems Review of Systems  Constitutional: Negative for chills and fever.  HENT: Positive for dental problem and facial swelling. Negative for drooling, mouth sores, trouble swallowing and voice change.   Eyes: Negative for pain.  Respiratory: Negative for shortness of breath and stridor.   Gastrointestinal: Negative for nausea and vomiting.  Skin: Negative for color change and rash.     Physical Exam Updated Vital Signs BP (!) 122/96 (BP Location: Left Arm)   Pulse 78   Temp 98.6 F (37 C) (Oral)   Resp 16   Ht  (1.702 m)   Wt 119.3 kg (263 lb)   LMP 07/05/2017   SpO2 100%   BMI 41.19 kg/m   Physical Exam  Constitutional: She is oriented to person, place, and time. She appears well-developed and well-nourished. No distress.  HENT:  Head: Normocephalic and atraumatic.  Minimal facial swelling to right upper cheek, no involvement of the orbit, poor dentition, multiple previous extractions noted, no evidence of obvious abscess there is some erythema and gum inflammation around the right upper back molar and first premolar, no woody induration under the tongue suggestive of Ludwig's angina, posterior oropharynx clear and moist  Eyes: Pupils are equal, round, and reactive to light. EOM are normal. Right eye exhibits no discharge. Left eye exhibits no discharge.  No periorbital cellulitis, no pain with extraocular movements  Neck: Neck supple.  Pulmonary/Chest: Effort normal and breath sounds normal. No respiratory distress. She has no wheezes.  Lymphadenopathy:    She has no cervical adenopathy.    Neurological: She is alert and oriented to person, place, and time. Coordination normal.  Skin: Skin is warm and dry. Capillary refill takes less than 2 seconds. She is not diaphoretic. No erythema.  Psychiatric: She has a normal mood and affect. Her behavior is normal.  Nursing note and vitals reviewed.    ED Treatments / Results  Labs (all labs ordered are listed, but only abnormal results are displayed) Labs Reviewed - No data to display  EKG  EKG Interpretation None       Radiology No results found.  Procedures Dental Block Date/Time: 08/03/2017 6:04 PM Performed by: Dartha Lodge Authorized by: Dartha Lodge   Consent:    Consent obtained:  Verbal   Consent given by:  Patient   Risks discussed:  Infection, unsuccessful block and pain Indications:    Indications: dental pain   Location:    Block type:  Posterior superior alveolar   Laterality:  Right Procedure details (see MAR for exact dosages):    Syringe type:  Luer lock syringe   Needle gauge:  25 G   Anesthetic injected:  Bupivacaine 0.5% WITH epi   Injection procedure:  Anatomic landmarks identified, introduced needle, negative aspiration for blood and incremental injection Post-procedure details:    Outcome:  Pain relieved   Patient tolerance of procedure:  Tolerated well, no immediate complications   (including critical care time)  Medications Ordered in ED Medications  bupivacaine-epinephrine (MARCAINE W/ EPI) 0.5% -1:200000 injection 10 mL (not administered)     Initial Impression / Assessment and Plan / ED Course  I have reviewed the triage vital signs and the nursing notes.  Pertinent labs & imaging results that were available during my care of the patient were reviewed by me and considered in my medical decision making (see chart for details).  Patient with toothache.  No gross abscess.  Exam unconcerning for Ludwig's angina or spread of infection.  Dental block tolerated well by patient  and anesthesia achieved. Will treat with penicillin.  Urged patient to follow-up with dentist, divided information for free dental clinic this weekend.   Final Clinical Impressions(s) / ED Diagnoses   Final diagnoses:  Toothache  Pain, dental    New Prescriptions New Prescriptions   DOXYCYCLINE (VIBRAMYCIN) 100 MG CAPSULE    Take 1 capsule (100 mg total) by mouth 2 (two) times daily. One po bid x 7 days     Legrand Rams 08/03/17 Kathleen Lime, MD 08/04/17 [phone]

## 2017-08-12 IMAGING — CR DG KNEE COMPLETE 4+V*R*
4 series · 4 of 4 positions shown · non-contrast
Comparison: Plain films right knee 05/10/2005.

CLINICAL DATA: Twisting injury right knee at work today. Limited
range of motion.

EXAM:
RIGHT KNEE - COMPLETE 4+ VIEW

[t knee ap right]
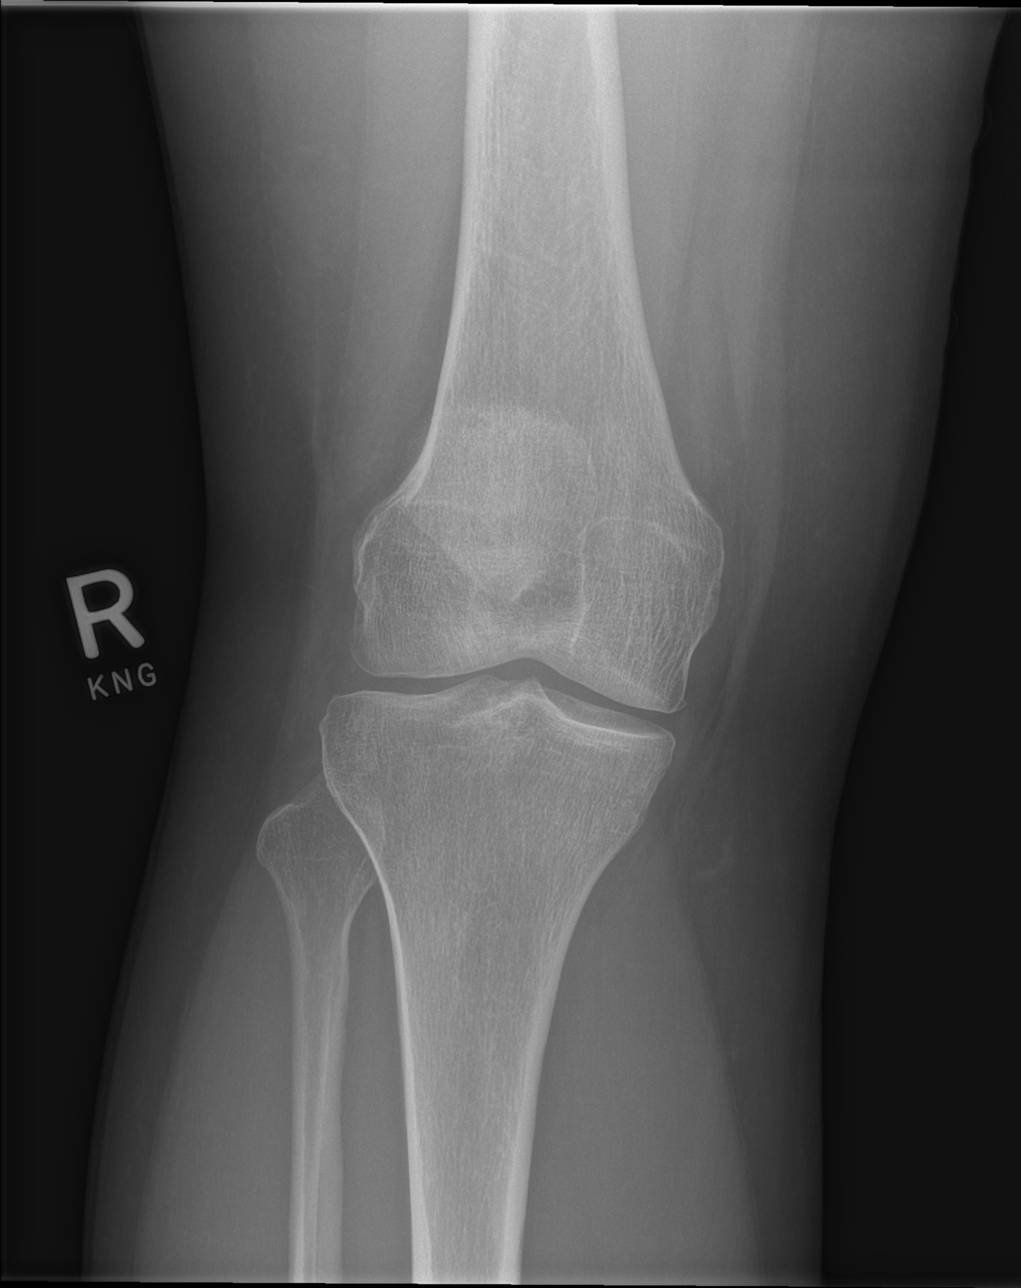

[t knee obl right (1 of 2)]
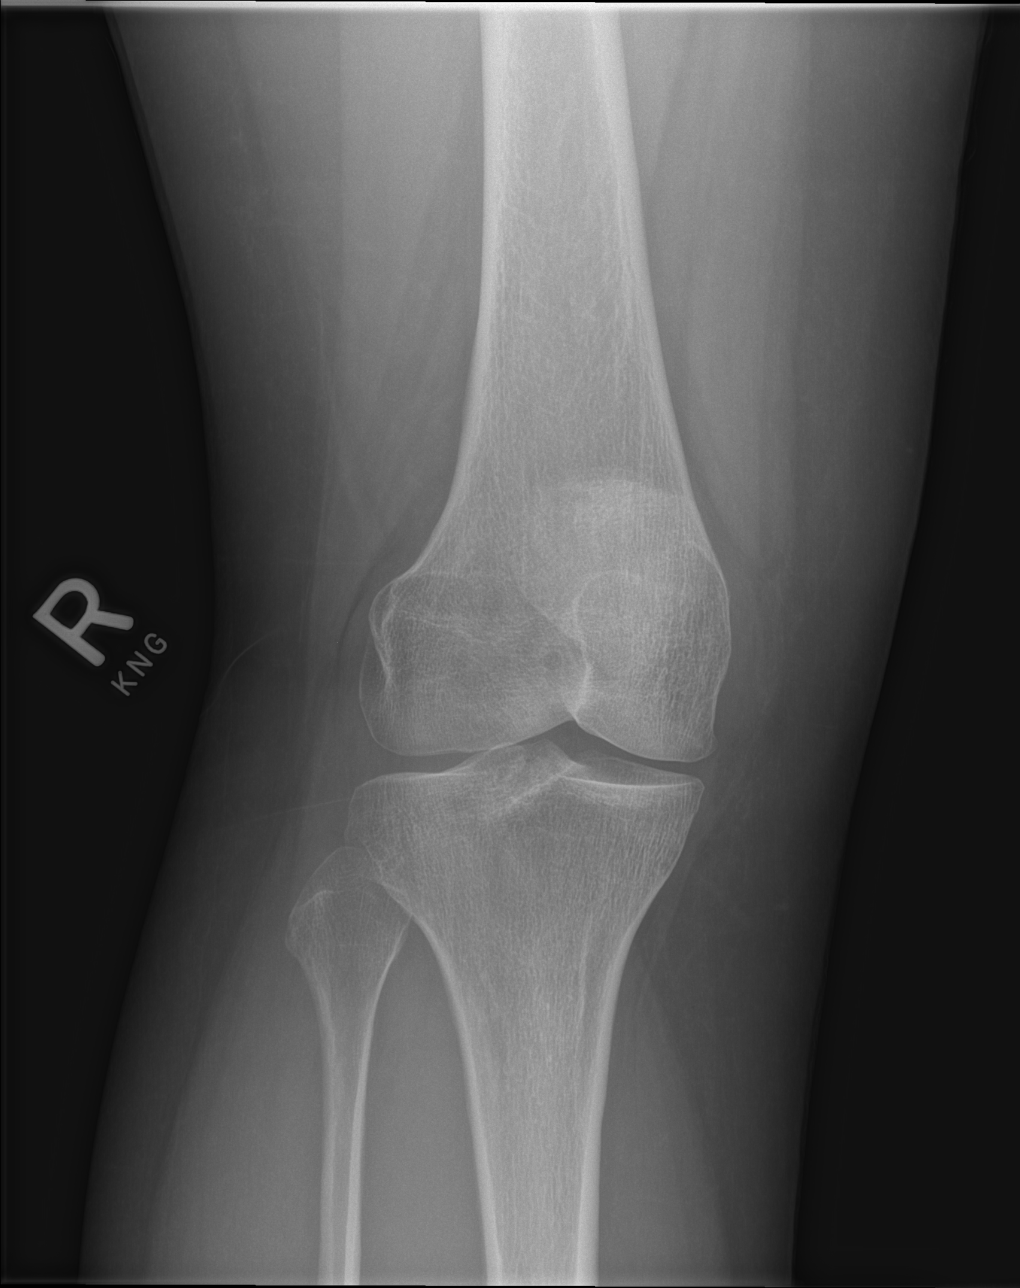

[t knee obl right (2 of 2)]
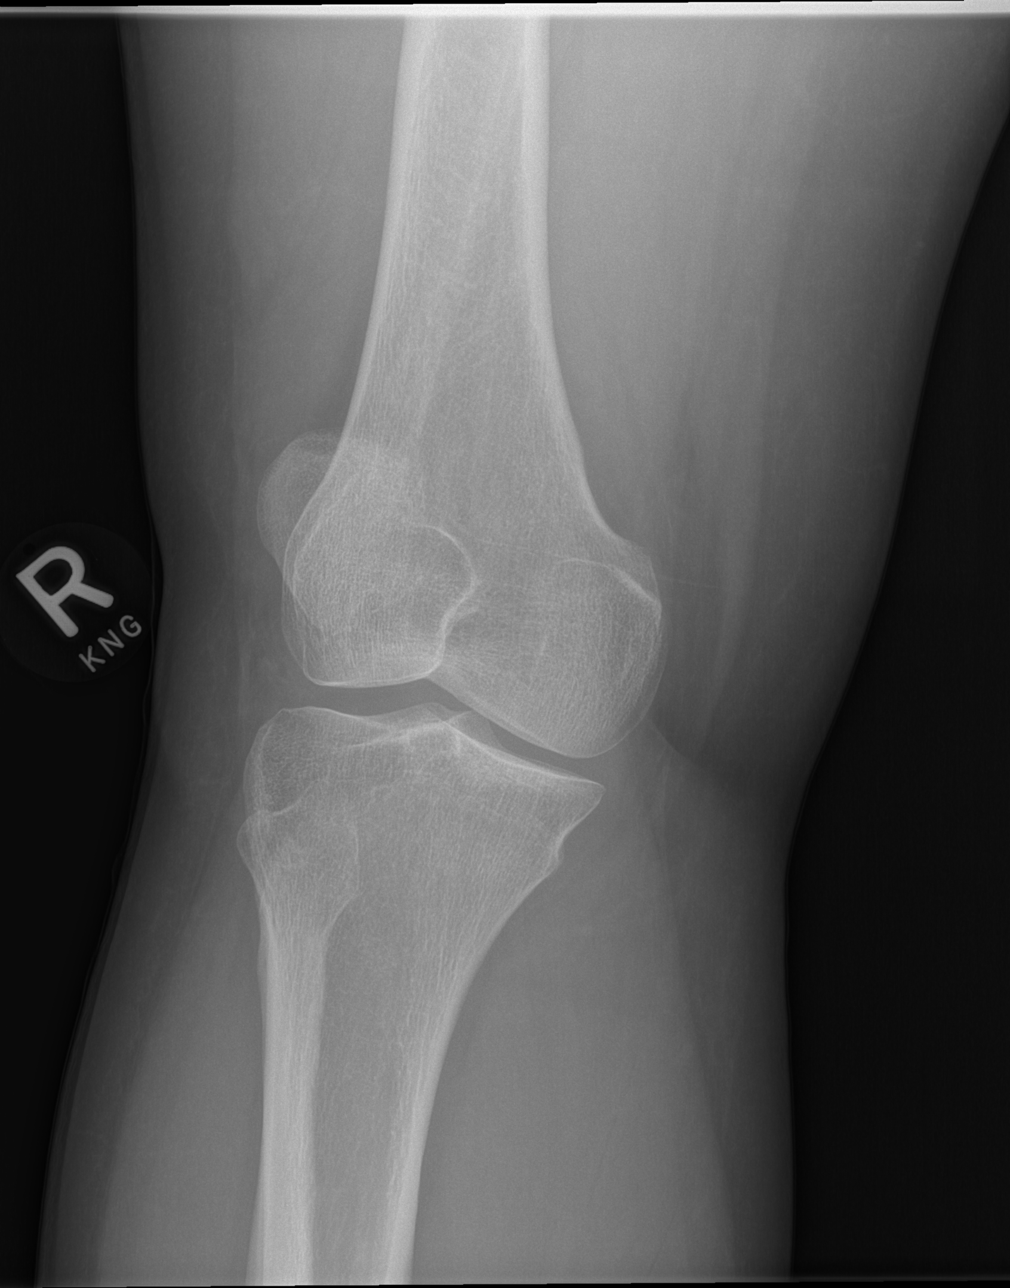

[t knee lat right]
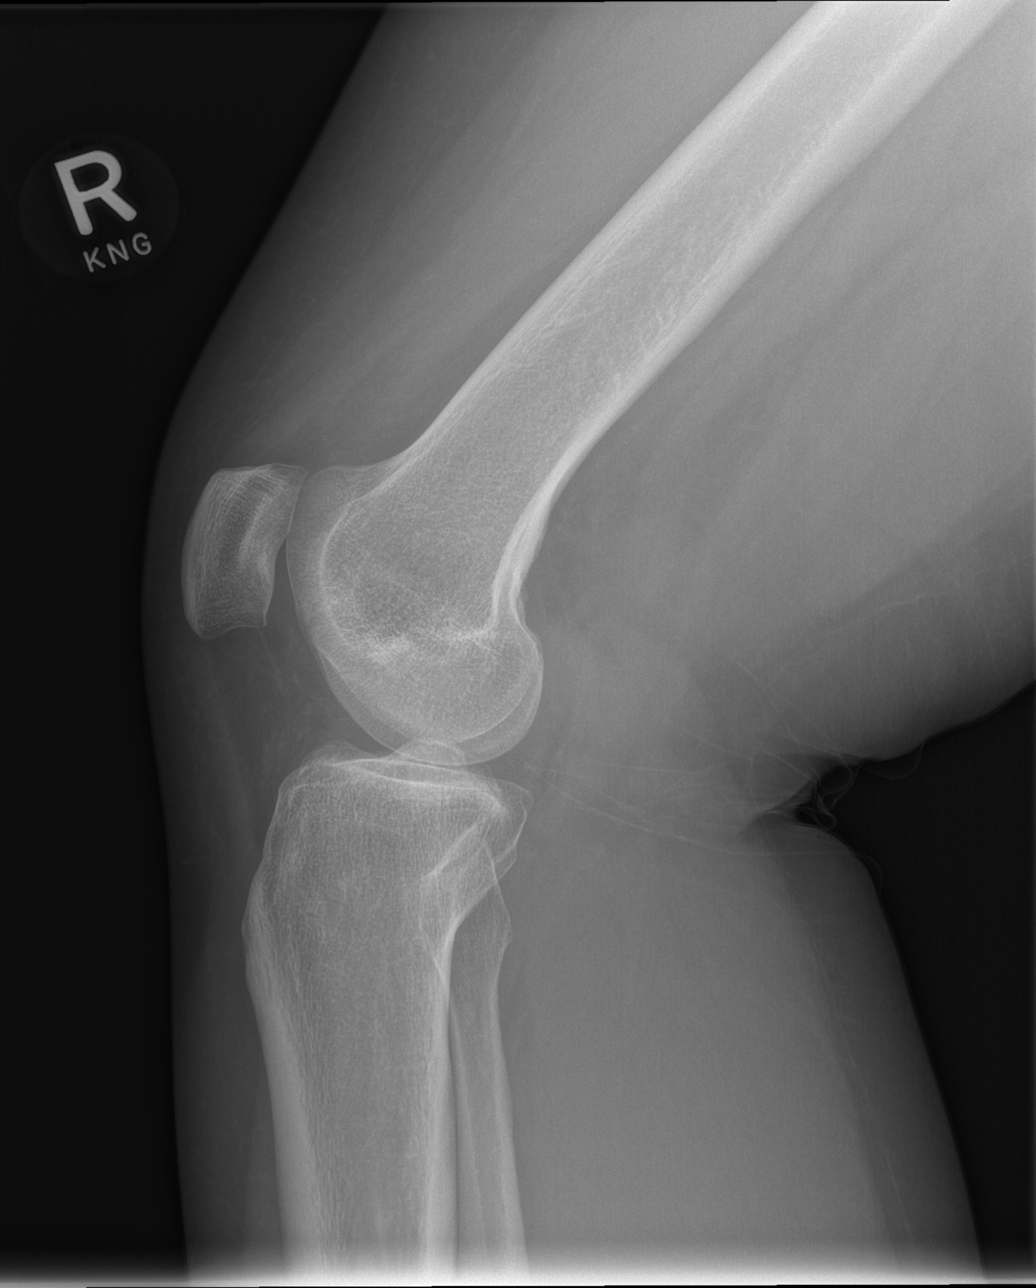

[4 of 4 positions shown; findings below may reference images not displayed]

FINDINGS: No evidence of fracture, dislocation, or joint effusion. No evidence
of arthropathy or other focal bone abnormality. Soft tissues are
unremarkable.
IMPRESSION: Negative exam.

## 2017-10-07 IMAGING — US US OB COMP LESS 14 WK
1 series · 15 of 28 positions shown · non-contrast
Comparison: None applicable

CLINICAL DATA: Pelvic pain, left greater than right. Quantitative
beta HCG is pending.

LMP was05/05/2016.
Gestational age by LMP is9 weeks 5 days.
EDC by LMP is02/09/2017.
EXAM:
OBSTETRIC <14 WK US AND TRANSVAGINAL OB US
TECHNIQUE: Both transabdominal and transvaginal ultrasound examinations were
performed for complete evaluation of the gestation as well as the
maternal uterus, adnexal regions, and pelvic cul-de-sac.
Transvaginal technique was performed to assess early pregnancy.

[Series 1: us ob comp less 14 wk · 36 acquisitions, 15 frames shown]
[im 1/36]
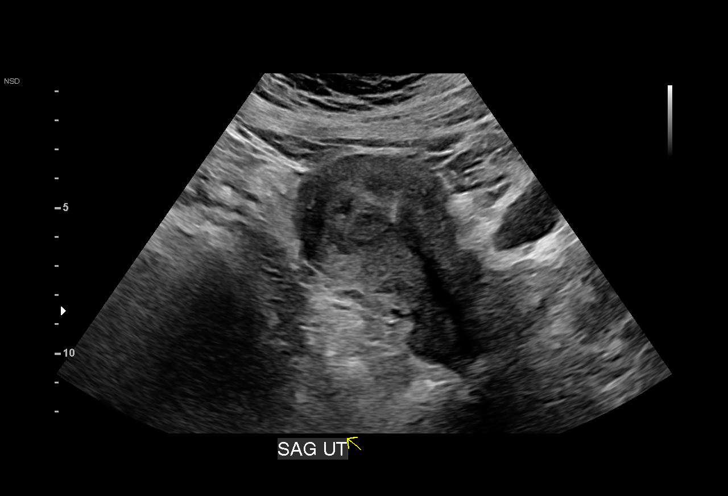
[im 3/36]
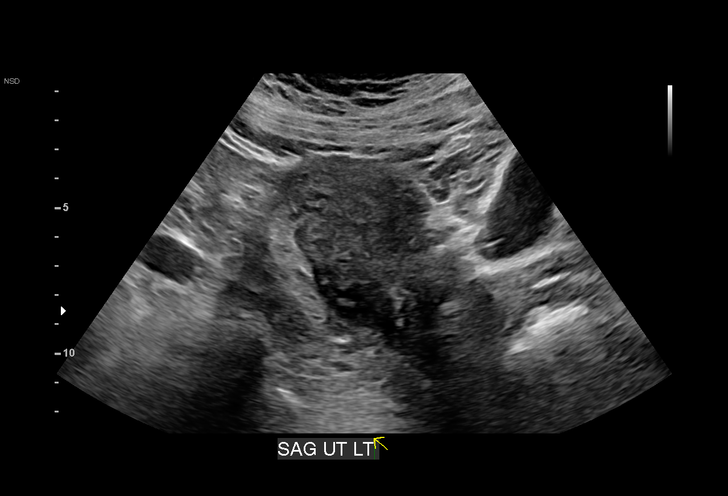
[im 6/36]
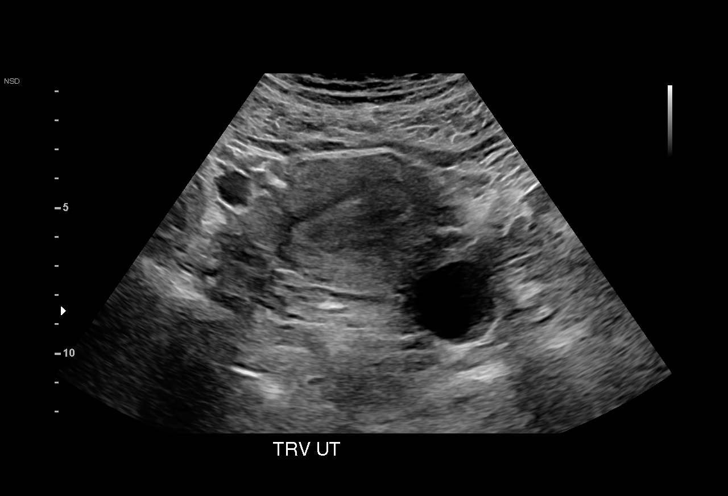
[im 8/36]
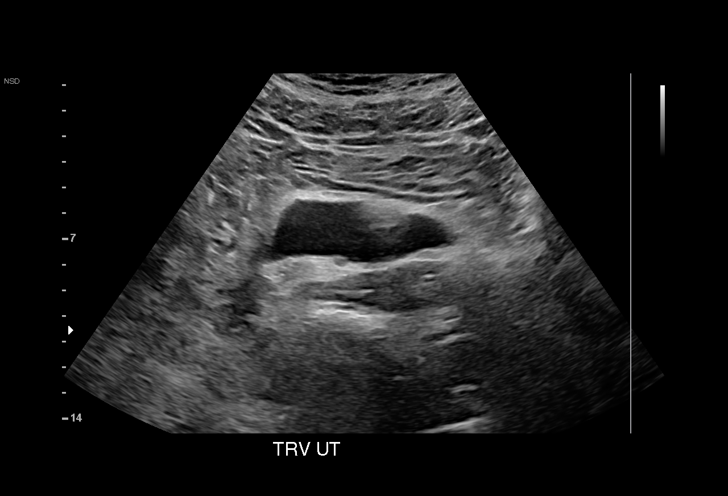
[im 11/36]
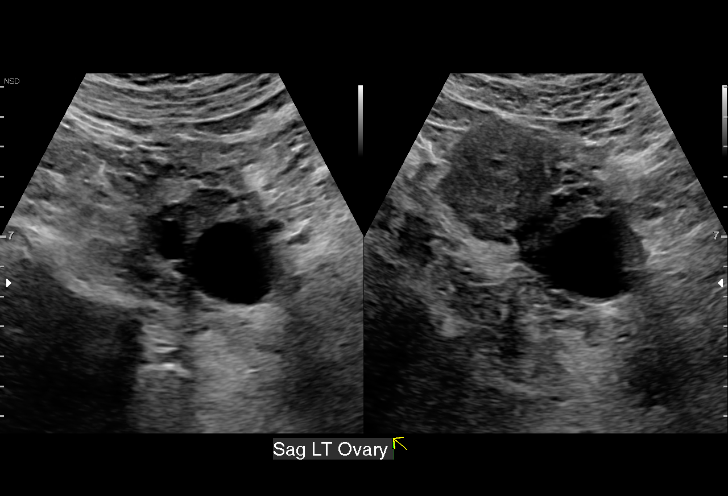
[im 13/36]
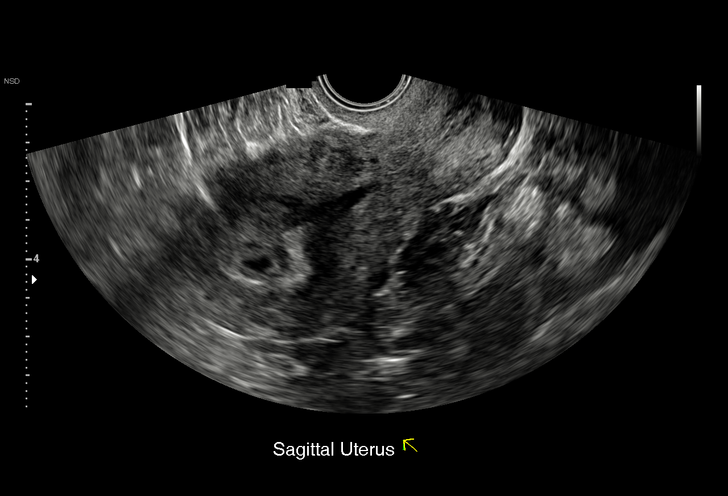
[im 16/36]
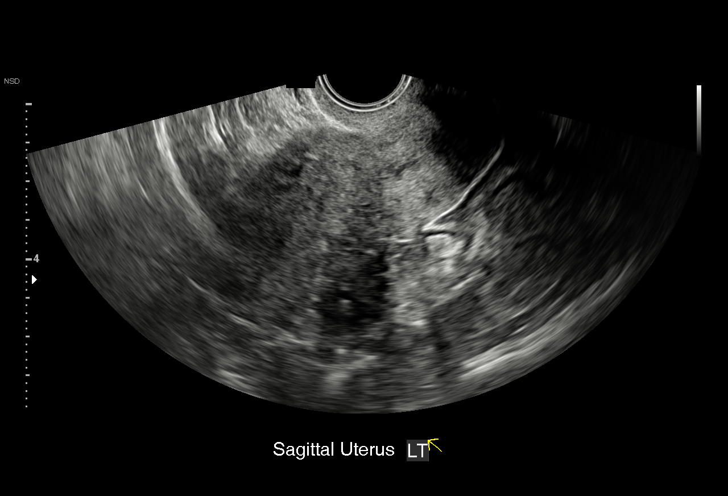
[im 19/36]
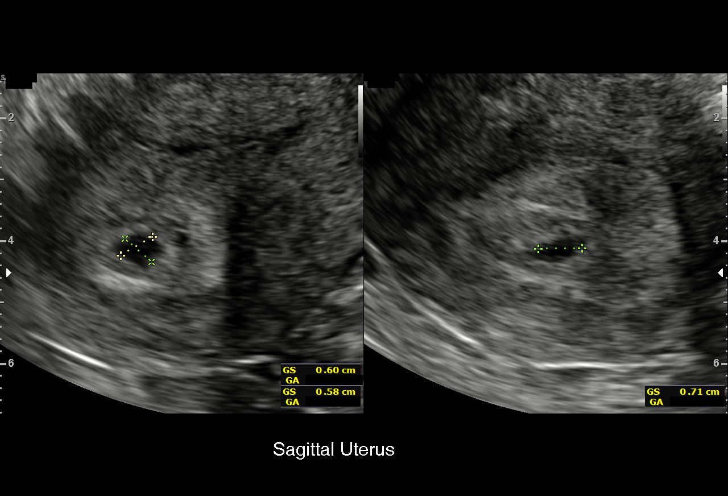
[im 20/36]
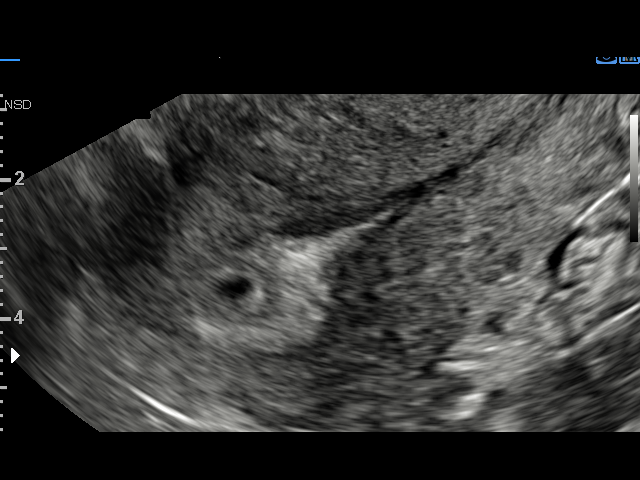
[im 23/36]
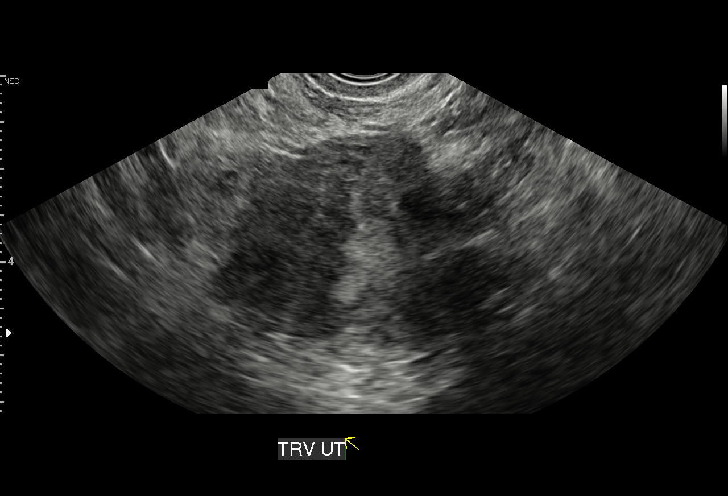
[im 25/36]
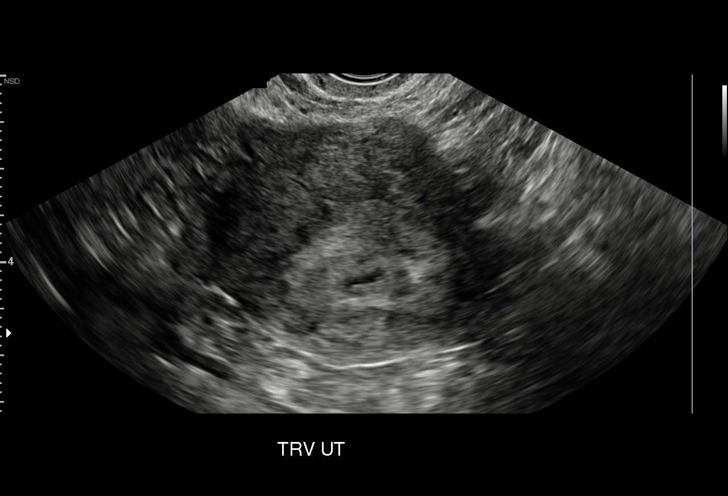
[im 28/36]
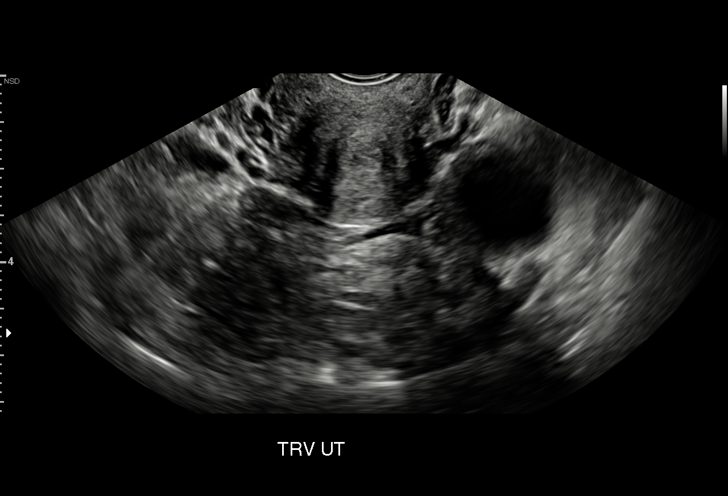
[im 30/36]
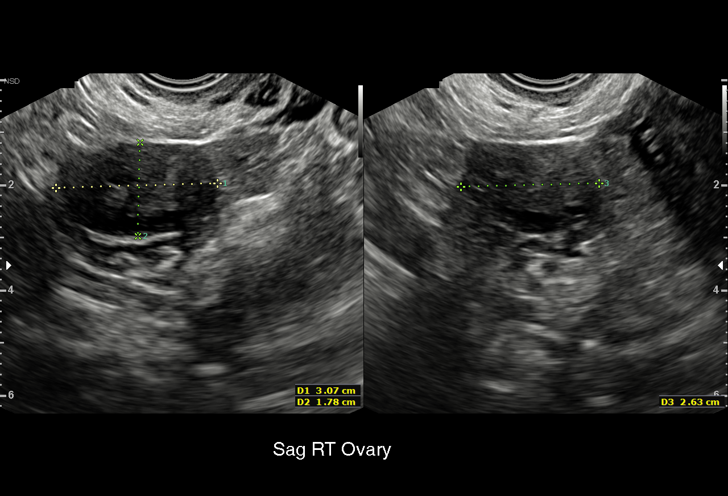
[im 33/36]
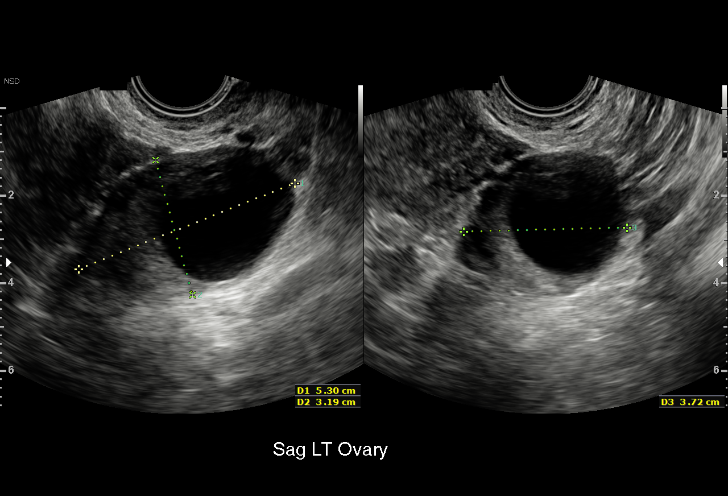
[im 36/36]
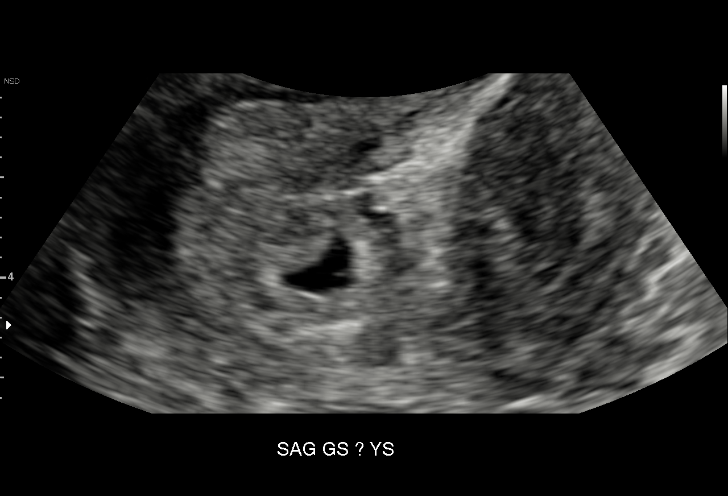

[15 of 28 positions shown; findings below may reference images not displayed]

FINDINGS: Intrauterine gestational sac: Present

Yolk sac:  Possible

Embryo:  Not seen

Cardiac Activity: Not seen

Heart Rate: Absent  bpm

MSD: 6.3  mm   5 w   2  d

Subchorionic hemorrhage:  None visualized.

Maternal uterus/adnexae: Left corpus luteum cyst is present and
measures 3.2 cm. Right ovary has normal appearance. No free pelvic
fluid.
IMPRESSION: 1. Intrauterine gestational sac is present. Possible yolk sac is
present. Embryo is not yet visualized. Follow-up ultrasound is
recommended in 14 or more days to document presence of fetal pole
and for dating purposes.
2. Clinical and ultrasound dating differ.
3. 3.2 cm left corpus luteum cyst.

## 2018-04-10 ENCOUNTER — Ambulatory Visit: Payer: Medicaid Other | Admitting: Obstetrics

## 2018-05-08 ENCOUNTER — Ambulatory Visit: Payer: Medicaid Other | Attending: Nurse Practitioner | Admitting: Nurse Practitioner

## 2018-05-08 ENCOUNTER — Encounter: Payer: Self-pay | Admitting: Nurse Practitioner

## 2018-05-08 VITALS — BP 110/78 | HR 95 | Temp 99.2°F | Ht 67.0 in | Wt 274.8 lb

## 2018-05-08 DIAGNOSIS — F53 Postpartum depression: Secondary | ICD-10-CM

## 2018-05-08 DIAGNOSIS — Z882 Allergy status to sulfonamides status: Secondary | ICD-10-CM | POA: Diagnosis not present

## 2018-05-08 DIAGNOSIS — Z880 Allergy status to penicillin: Secondary | ICD-10-CM | POA: Insufficient documentation

## 2018-05-08 DIAGNOSIS — O99345 Other mental disorders complicating the puerperium: Secondary | ICD-10-CM

## 2018-05-08 DIAGNOSIS — N946 Dysmenorrhea, unspecified: Secondary | ICD-10-CM

## 2018-05-08 DIAGNOSIS — K219 Gastro-esophageal reflux disease without esophagitis: Secondary | ICD-10-CM

## 2018-05-08 MED ORDER — RANITIDINE HCL 150 MG PO TABS: 150.0000 mg | ORAL_TABLET | Freq: Two times a day (BID) | ORAL | 3 refills | Status: DC

## 2018-05-08 MED ORDER — ESCITALOPRAM OXALATE 20 MG PO TABS: 20.0000 mg | ORAL_TABLET | Freq: Every day | ORAL | 1 refills | Status: DC

## 2018-05-08 NOTE — Patient Instructions (Signed)
Dysmenorrhea Dysmenorrhea means painful cramps during your period (menstrual period). You will have pain in your lower belly (abdomen). The pain is caused by the tightening (contracting) of the muscles of the womb (uterus). The pain may be mild or very bad. With this condition, you may:  Have a headache.  Feel sick to your stomach (nauseous).  Throw up (vomit).  Have lower back pain.  Follow these instructions at home: Helping pain and cramping  Put heat on your lower back or belly when you have pain or cramps. Use the heat source that your doctor tells you to use. ? Place a towel between your skin and the heat. ? Leave the heat on for 20-30 minutes. ? Remove the heat if your skin turns bright red. This is especially important if you cannot feel pain, heat, or cold. ? Do not have a heating pad on during sleep.  Do aerobic exercises. These include walking, swimming, or biking. These may help with cramps.  Massage your lower back or belly. This may help lessen pain. General instructions  Take over-the-counter and prescription medicines only as told by your doctor.  Do not drive or use heavy machinery while taking prescription pain medicine.  Avoid alcohol and caffeine during and right before your period. These can make cramps worse.  Do not use any products that have nicotine or tobacco. These include cigarettes and e-cigarettes. If you need help quitting, ask your doctor.  Keep all follow-up visits as told by your doctor. This is important. Contact a doctor if:  You have pain that gets worse.  You have pain that does not get better with medicine.  You have pain during sex.  You feel sick to your stomach or you throw up during your period, and medicine does not help. Get help right away if:  You pass out (faint). Summary  Dysmenorrhea means painful cramps during your period (menstrual period).  Put heat on your lower back or belly when you have pain or cramps.  Do  exercises like walking, swimming, or biking to help with cramps.  Contact a doctor if you have pain during sex. This information is not intended to replace advice given to you by your health care provider. Make sure you discuss any questions you have with your health care provider. Document Released: 01/21/2009 Document Revised: 11/11/2016 Document Reviewed: 11/11/2016 Elsevier Interactive Patient Education  2017 Elsevier Inc.  Dysfunctional Uterine Bleeding Dysfunctional uterine bleeding is abnormal bleeding from the uterus. Dysfunctional uterine bleeding includes:  A period that comes earlier or later than usual.  A period that is lighter, heavier, or has blood clots.  Bleeding between periods.  Skipping one or more periods.  Bleeding after sexual intercourse.  Bleeding after menopause.  Follow these instructions at home: Pay attention to any changes in your symptoms. Follow these instructions to help with your condition: Eating and drinking  Eat well-balanced meals. Include foods that are high in iron, such as liver, meat, shellfish, green leafy vegetables, and eggs.  If you become constipated: ? Drink plenty of water. ? Eat fruits and vegetables that are high in water and fiber, such as spinach, carrots, raspberries, apples, and mango. Medicines  Take over-the-counter and prescription medicines only as told by your health care provider.  Do not change medicines without talking with your health care provider.  Aspirin or medicines that contain aspirin may make the bleeding worse. Do not take those medicines: ? During the week before your period. ? During your period.  If you were prescribed iron pills, take them as told by your health care provider. Iron pills help to replace iron that your body loses because of this condition. Activity  If you need to change your sanitary pad or tampon more than one time every 2 hours: ? Lie in bed with your feet raised  (elevated). ? Place a cold pack on your lower abdomen. ? Rest as much as possible until the bleeding stops or slows down.  Do not try to lose weight until the bleeding has stopped and your blood iron level is back to normal. Other Instructions  For two months, write down: ? When your period starts. ? When your period ends. ? When any abnormal bleeding occurs. ? What problems you notice.  Keep all follow up visits as told by your health care provider. This is important. Contact a health care provider if:  You get light-headed or weak.  You have nausea and vomiting.  You cannot eat or drink without vomiting.  You feel dizzy or have diarrhea while you are taking medicines.  You are taking birth control pills or hormones, and you want to change them or stop taking them. Get help right away if:  You develop a fever or chills.  You need to change your sanitary pad or tampon more than one time per hour.  Your bleeding becomes heavier, or your flow contains clots more often.  You develop pain in your abdomen.  You lose consciousness.  You develop a rash. This information is not intended to replace advice given to you by your health care provider. Make sure you discuss any questions you have with your health care provider. Document Released: 10/22/2000 Document Revised: 04/01/2016 Document Reviewed: 01/20/2015 Elsevier Interactive Patient Education  [employer]2018 Elsevier Inc.

## 2018-05-08 NOTE — Progress Notes (Signed)
Assessment & Plan:  Tanya HarrisonDansirae was seen today for new patient (initial visit).  Diagnoses and all orders for this visit:  Gastroesophageal reflux disease, esophagitis presence not specified -     ranitidine (ZANTAC) 150 MG tablet; Take 1 tablet (150 mg total) by mouth 2 (two) times daily. INSTRUCTIONS: Avoid GERD Triggers: acidic, spicy or fried foods, caffeine, coffee, sodas,  alcohol and chocolate.   Postpartum depression -     escitalopram (LEXAPRO) 20 MG tablet; Take 1 tablet (20 mg total) by mouth daily.  Dysmenorrhea PAP at next office visit  Patient has been counseled on age-appropriate routine health concerns for screening and prevention. These are reviewed and up-to-date. Referrals have been placed accordingly. Immunizations are up-to-date or declined.    Subjective:   Chief Complaint  Patient presents with  . New Patient (Initial Visit)    Pt. stated she had chest pain and right leg pain. Pt. stated after her recent pregnancy her cramps during her period are getting bad.    HPI Tanya Terry 32 y.o. female presents to office today to establish care. She has a history of GERD and post partum depression for which she takes Lexapro 20mg  daily. She has complaints today of menorrhagia, passing heavy clots with menstrual cycles and dysmenorrhea. She also endorses epigastric pain similar to previous GERD symptoms.   GERD: Patitent complains of heartburn. This has been associated with heartburn and midespigastric pain.  She denies chest pain, choking on food, difficulty swallowing and dysphagia. Symptoms have been present for several weeks. She denies dysphagia.  She has not lost weight. She denies melena, hematochezia, hematemesis, and coffee ground emesis. Medical therapy in the past has included H2 antagonists and PPIs. She has a history of peptic ulcer.   Dysmenorrhea/ Premenstrual Syndrome: Patient complains of menstrual symptoms. Symptoms began several months ago. Patient  describes symptoms of  menorrhagia (moderate to severe) and menstrual cramping (moderate). Patient denies decreased libido and dyspareunia. Evaluation to date includes none. Treatment to date includes nothing. The patient is sexually active.   Depression Chronic. Symptoms well controlled with Lexapro.  Depression screen PHQ 2/9 05/08/2018  Decreased Interest 0  Down, Depressed, Hopeless 0  PHQ - 2 Score 0  Altered sleeping 0  Tired, decreased energy 0  Change in appetite 0  Feeling bad or failure about yourself  0  Trouble concentrating 0  Moving slowly or fidgety/restless 0  Suicidal thoughts 0  PHQ-9 Score 0    Review of Systems  Constitutional: Negative for fever, malaise/fatigue and weight loss.  HENT: Negative.  Negative for nosebleeds.   Eyes: Negative.  Negative for blurred vision, double vision and photophobia.  Respiratory: Negative.  Negative for cough and shortness of breath.   Cardiovascular: Negative.  Negative for chest pain, palpitations and leg swelling.  Gastrointestinal: Positive for heartburn. Negative for nausea and vomiting.  Genitourinary:       SEE HPI  Musculoskeletal: Negative.  Negative for myalgias.  Neurological: Negative.  Negative for dizziness, focal weakness, seizures and headaches.  Psychiatric/Behavioral: Positive for depression. Negative for suicidal ideas.    Past Medical History:  Diagnosis Date  . Benign heart murmur   . Depression   . Diabetes mellitus without complication (HCC)   . GERD (gastroesophageal reflux disease)   . Peptic ulcer   . Ulcer    stomach (no longer taking meds)    Past Surgical History:  Procedure Laterality Date  . MYRINGOTOMY     with tubes  .  UPPER GASTROINTESTINAL ENDOSCOPY    . WISDOM TOOTH EXTRACTION      Family History  Problem Relation Age of Onset  . Diabetes Mother   . Hypertension Mother   . Heart disease Father   . Hypertension Father   . Anesthesia problems Neg Hx   . Hypotension Neg Hx     . Malignant hyperthermia Neg Hx   . Pseudochol deficiency Neg Hx     Social History Reviewed with no changes to be made today.   Outpatient Medications Prior to Visit  Medication Sig Dispense Refill  . acetaminophen (TYLENOL) 500 MG tablet Take 500 mg by mouth every 6 (six) hours as needed for mild pain or moderate pain.     Marland Kitchen senna-docusate (SENOKOT-S) 8.6-50 MG tablet Take 2 tablets by mouth at bedtime. (Patient not taking: Reported on 04/07/2017) 60 tablet 2  . clindamycin (CLEOCIN) 300 MG capsule Take 1 capsule (300 mg total) by mouth 3 (three) times daily. (Patient not taking: Reported on 04/07/2017) 30 capsule 1  . clotrimazole (LOTRIMIN) 1 % cream Apply 1 application topically 2 (two) times daily. (Patient not taking: Reported on 03/14/2017) 113 g 2  . cyclobenzaprine (FLEXERIL) 10 MG tablet Take 1 tablet (10 mg total) by mouth every 8 (eight) hours as needed for muscle spasms. (Patient not taking: Reported on 05/08/2018) 30 tablet 2  . doxycycline (VIBRAMYCIN) 100 MG capsule Take 1 capsule (100 mg total) by mouth 2 (two) times daily. One po bid x 7 days (Patient not taking: Reported on 05/08/2018) 14 capsule 0  . escitalopram (LEXAPRO) 10 MG tablet Take 1 tablet (10 mg total) by mouth daily. (Patient not taking: Reported on 05/08/2018) 30 tablet 5  . hydrocortisone cream 0.5 % Apply 1 application topically 2 (two) times daily. (Patient not taking: Reported on 03/14/2017) 56 g 0  . ibuprofen (ADVIL,MOTRIN) 600 MG tablet Take 1 tablet (600 mg total) by mouth every 6 (six) hours. (Patient not taking: Reported on 05/18/2017) 120 tablet 2  . oxyCODONE (OXY IR/ROXICODONE) 5 MG immediate release tablet Take 1 tablet (5 mg total) by mouth every 4 (four) hours as needed for severe pain. (Patient not taking: Reported on 05/18/2017) 30 tablet 0  . Prenatal Vit-Fe Fum-FA-Omega (ONE-A-DAY WOMENS PRENATAL PO) Take 2 each by mouth daily. gummy     No facility-administered medications prior to visit.      Allergies  Allergen Reactions  . Augmentin [Amoxicillin-Pot Clavulanate] Anaphylaxis, Shortness Of Breath and Swelling    Has patient had a PCN reaction causing immediate rash, facial/tongue/throat swelling, SOB or lightheadedness with hypotension: Yes Has patient had a PCN reaction causing severe rash involving mucus membranes or skin necrosis: Yes Has patient had a PCN reaction that required hospitalization No Has patient had a PCN reaction occurring within the last 10 years: Yes If all of the above answers are "NO", then may proceed with Cephalosporin use.   . Sulfa Antibiotics Anaphylaxis, Shortness Of Breath and Swelling       Objective:    BP 110/78 (BP Location: Right Arm, Patient Position: Sitting, Cuff Size: Large)   Pulse 95   Temp 99.2 F (37.3 C) (Oral)   Ht 5\' 7"  (1.702 m)   Wt 274 lb 12.8 oz (124.6 kg)   LMP 05/07/2018   SpO2 99%   BMI 43.04 kg/m  Wt Readings from Last 3 Encounters:  05/08/18 274 lb 12.8 oz (124.6 kg)  08/03/17 263 lb (119.3 kg)  05/18/17 296 lb (134.3 kg)  Physical Exam  Constitutional: She is oriented to person, place, and time. She appears well-developed and well-nourished. She is cooperative.  HENT:  Head: Normocephalic and atraumatic.  Eyes: EOM are normal.  Neck: Normal range of motion.  Cardiovascular: Normal rate and regular rhythm. Exam reveals no gallop and no friction rub.  Murmur heard. Pulmonary/Chest: Effort normal and breath sounds normal. No tachypnea. No respiratory distress. She has no decreased breath sounds. She has no wheezes. She has no rhonchi. She has no rales. She exhibits no tenderness.  Abdominal: Bowel sounds are normal.  Musculoskeletal: Normal range of motion. She exhibits no edema.  Neurological: She is alert and oriented to person, place, and time. Coordination normal.  Skin: Skin is warm and dry.  Psychiatric: She has a normal mood and affect. Her behavior is normal. Judgment and thought content  normal.  Nursing note and vitals reviewed.      Patient has been counseled extensively about nutrition and exercise as well as the importance of adherence with medications and regular follow-up. The patient was given clear instructions to go to ER or return to medical center if symptoms don't improve, worsen or new problems develop. The patient verbalized understanding.   Follow-up: Return for schedule PAP and Needs appointment with financial representative.Claiborne Rigg, FNP-BC [facility] and [facility] [place], [place] [phone]   05/13/2018, 11:54 PM

## 2018-05-09 IMAGING — US US MFM OB FOLLOW-UP
1 series · 14 of 28 positions shown · non-contrast
Comparison: none

[Series 1: us mfm ob follow-up · 14 of 40 slices shown]
[im 2/40]
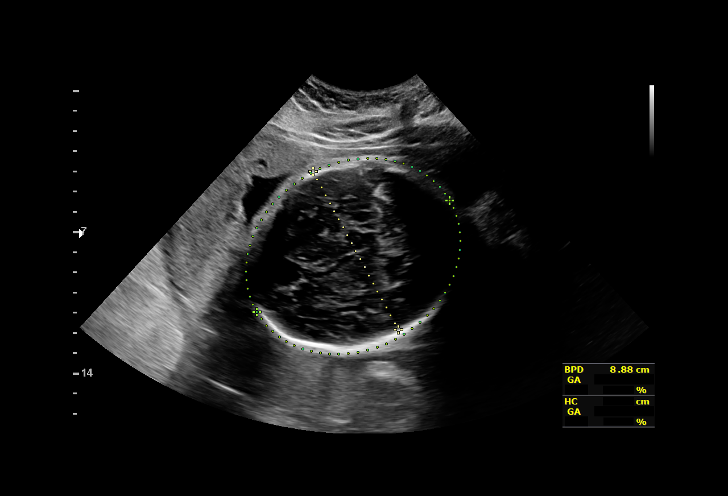
[im 5/40]
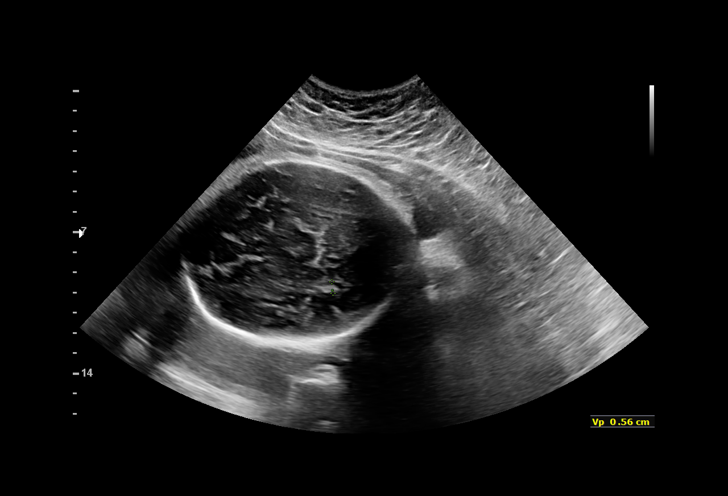
[im 8/40]
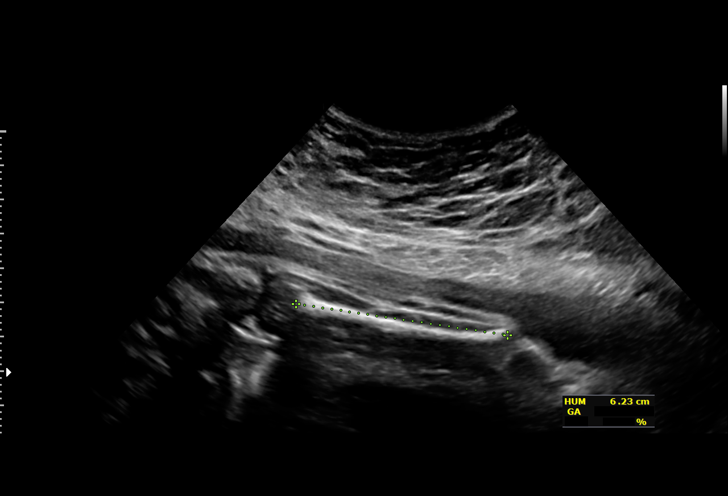
[im 11/40]
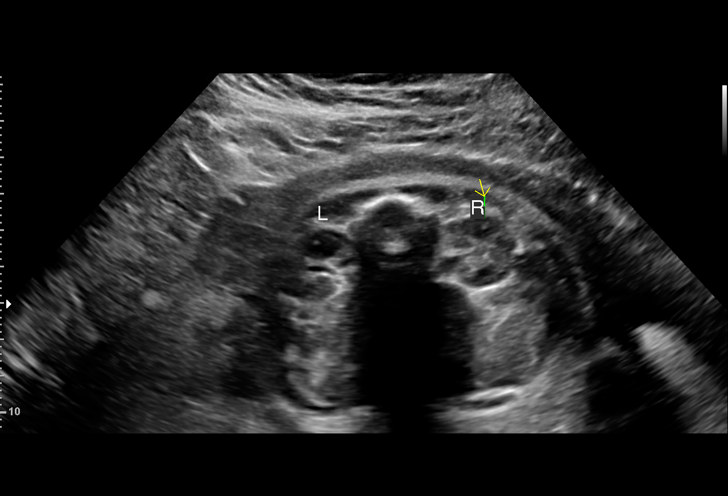
[im 14/40]
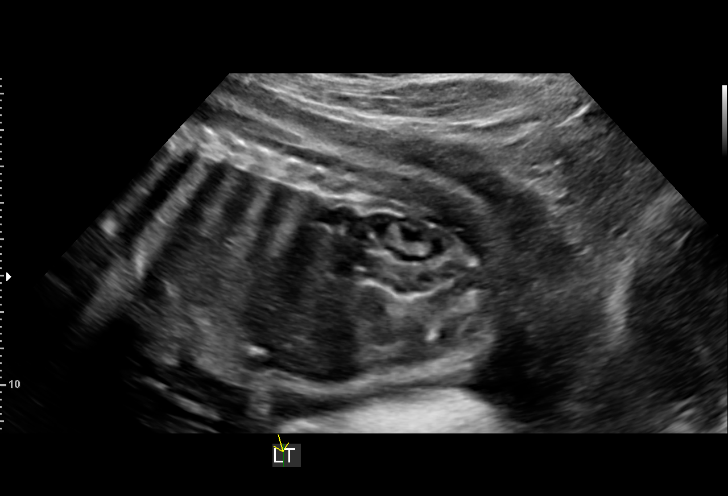
[im 16/40]
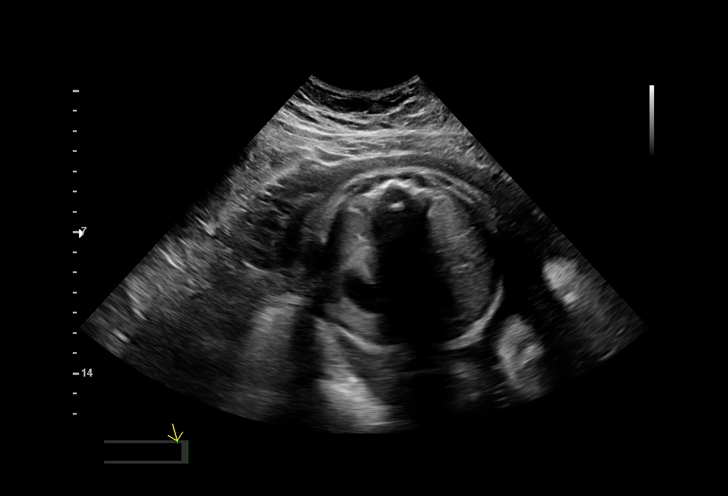
[im 19/40]
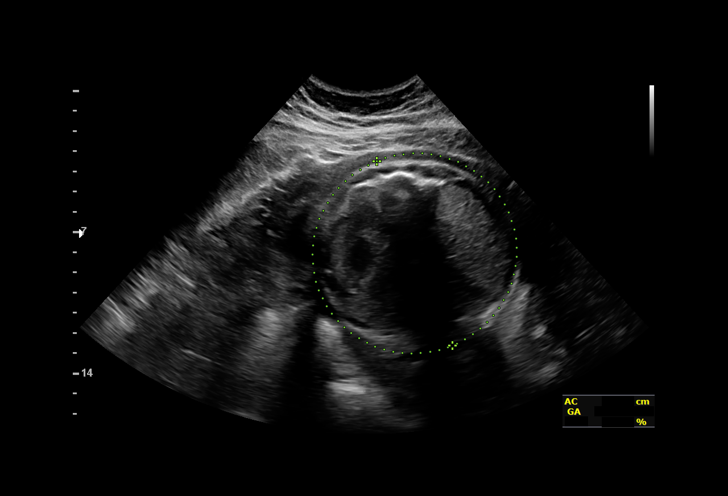
[im 22/40]
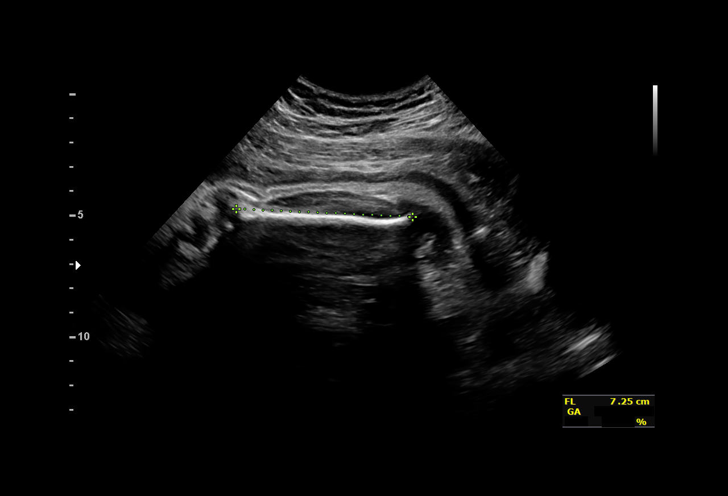
[im 25/40]
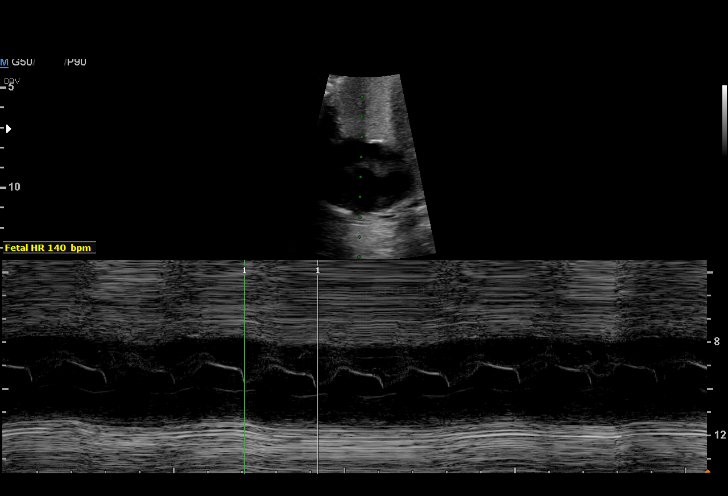
[im 28/40]
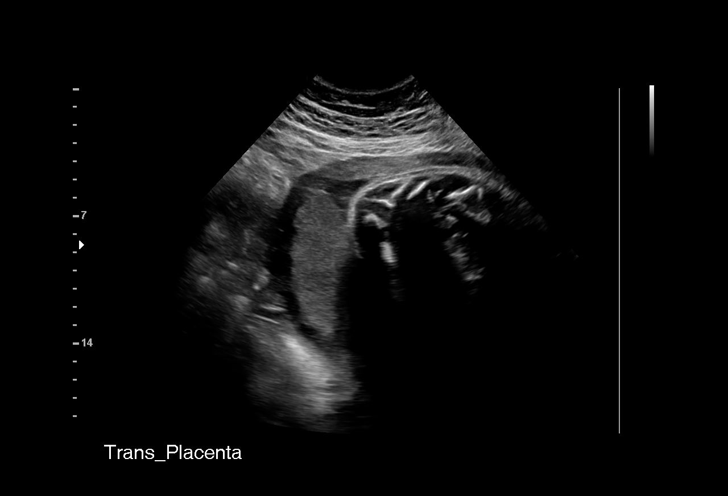
[im 31/40]
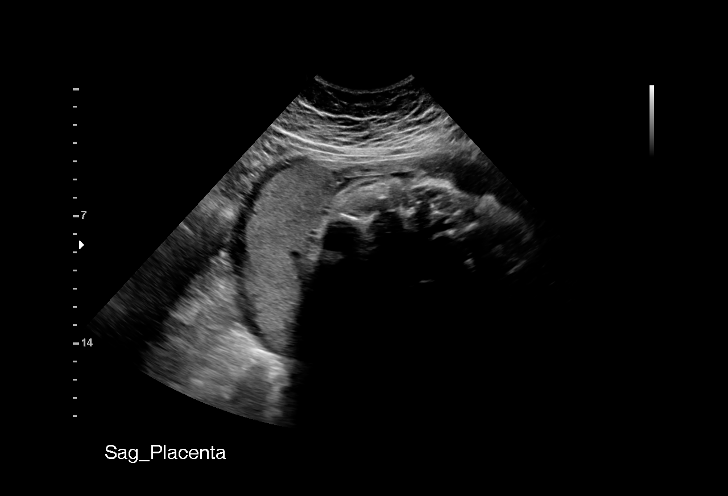
[im 34/40]
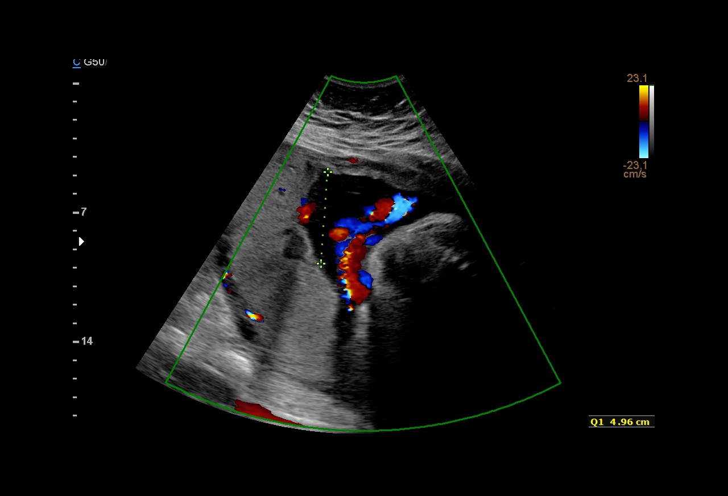
[im 37/40]
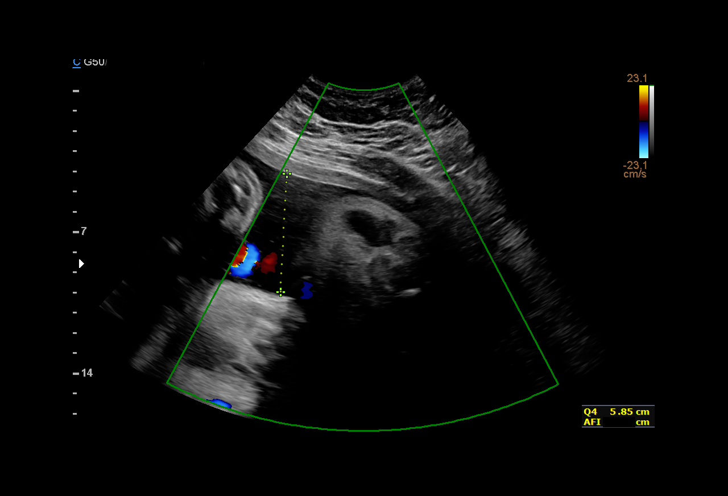
[im 40/40]
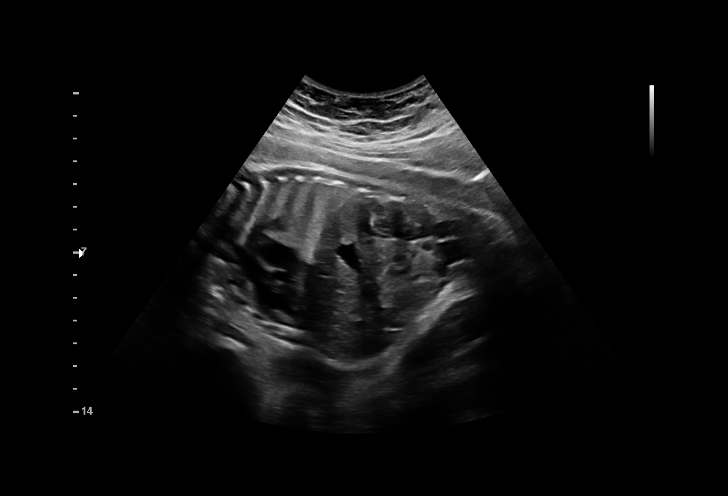

[14 of 28 positions shown; findings below may reference images not displayed]

1  GIORGI JUMPER           [id number]      [id number]     [id number]
Indications

35 weeks gestation of pregnancy
Obesity complicating pregnancy, third
trimester o77.816
Encounter for other antenatal screening
follow-up
Fetal arrhythmia affecting pregnancy,
antepartum
Gestational diabetes in pregnancy,
unspecified control
OB History

Blood Type:            Height:  5'6"   Weight (lb):  274       BMI:
Gravidity:    3         Term:   2
Living:       2
Fetal Evaluation

Num Of Fetuses:     1
Fetal Heart         146
Rate(bpm):
Cardiac Activity:   Observed
Presentation:       Breech
Placenta:           Posterior, above cervical os
P. Cord Insertion:  Previously Visualized

Amniotic Fluid
AFI FV:      Subjectively within normal limits

AFI Sum(cm)     %Tile       Largest Pocket(cm)
19.35           72
RUQ(cm)       RLQ(cm)       LUQ(cm)        LLQ(cm)
4.96
Biometry

BPD:      89.1  mm     G. Age:  36w 0d         63  %    CI:         77.8   %    70 - 86
FL/HC:      22.5   %    20.1 -
HC:      319.7  mm     G. Age:  36w 0d         23  %    HC/AC:      1.01        0.93 -
AC:      315.9  mm     G. Age:  35w 3d         50  %    FL/BPD:     80.7   %    71 - 87
FL:       71.9  mm     G. Age:  36w 6d         70  %    FL/AC:      22.8   %    20 - 24
HUM:      62.6  mm     G. Age:  36w 2d         71  %

Est. FW:    9059  gm      6 lb 3 oz     66  %
Gestational Age

LMP:           40w 2d        Date:  05/05/16                 EDD:   02/09/17
U/S Today:     36w 1d                                        EDD:   03/10/17
Best:          35w 6d     Det. By:  Early Ultrasound         EDD:   03/12/17
(07/12/16)
Anatomy

Cranium:               Appears normal         Aortic Arch:            Previously seen
Cavum:                 Previously seen        Ductal Arch:            Not well visualized
Ventricles:            Appears normal         Diaphragm:              Appears normal
Choroid Plexus:        Previously seen        Stomach:                Appears normal, left
sided
Cerebellum:            Previously seen        Abdomen:                Appears normal
Posterior Fossa:       Not well visualized    Abdominal Wall:         Previously seen
Nuchal Fold:           Not applicable (>20    Cord Vessels:           Previously seen
wks GA)
Face:                  Orbits and profile     Kidneys:                Appear normal
previously seen
Lips:                  Previously seen        Bladder:                Appears normal
Thoracic:              Appears normal         Spine:                  Previously seen
Heart:                 Previously seen        Upper Extremities:      Previously seen
RVOT:                  Previously seen        Lower Extremities:      Previously seen
LVOT:                  Previously seen

Other:  Female gender previously seen. Heels and Nasal bone previously
visualized. Technically difficult due to maternal habitus and fetal
position.
Cervix Uterus Adnexa

Cervix
Not visualized (advanced GA >06wks)

Uterus
No abnormality visualized.

Left Ovary
Not visualized.

Right Ovary
Not visualized.
Impression

Single living intrauterine pregnancy at 35 weeks 6 days.
Appropriate interval fetal growth (66%).
Normal amniotic fluid volume.
Normal interval fetal anatomy.
No arrhythmias were seen on today's ultrasound.
Recommendations

Follow-up ultrasounds as clinically indicated.

## 2018-05-13 ENCOUNTER — Encounter: Payer: Self-pay | Admitting: Nurse Practitioner

## 2018-05-29 ENCOUNTER — Emergency Department (HOSPITAL_COMMUNITY)
Admission: EM | Admit: 2018-05-29 | Discharge: 2018-05-29 | Disposition: A | Discharge status: Home / Self Care | Payer: Medicaid Other | Attending: Emergency Medicine | Admitting: Emergency Medicine

## 2018-05-29 ENCOUNTER — Encounter (HOSPITAL_COMMUNITY): Payer: Self-pay | Admitting: *Deleted

## 2018-05-29 ENCOUNTER — Other Ambulatory Visit (HOSPITAL_COMMUNITY)
Admission: RE | Admit: 2018-05-29 | Discharge: 2018-05-29 | Disposition: A | Discharge status: Home / Self Care | Payer: Medicaid Other | Source: Ambulatory Visit | Attending: Obstetrics | Admitting: Obstetrics

## 2018-05-29 ENCOUNTER — Encounter: Payer: Self-pay | Admitting: Obstetrics

## 2018-05-29 ENCOUNTER — Emergency Department (HOSPITAL_COMMUNITY): Payer: Medicaid Other

## 2018-05-29 ENCOUNTER — Ambulatory Visit (INDEPENDENT_AMBULATORY_CARE_PROVIDER_SITE_OTHER): Payer: Medicaid Other | Admitting: Obstetrics

## 2018-05-29 VITALS — BP 108/76 | HR 58 | Ht 67.0 in | Wt 272.0 lb

## 2018-05-29 DIAGNOSIS — Z87891 Personal history of nicotine dependence: Secondary | ICD-10-CM | POA: Diagnosis not present

## 2018-05-29 DIAGNOSIS — Z0000 Encounter for general adult medical examination without abnormal findings: Secondary | ICD-10-CM | POA: Diagnosis not present

## 2018-05-29 DIAGNOSIS — Z01419 Encounter for gynecological examination (general) (routine) without abnormal findings: Secondary | ICD-10-CM

## 2018-05-29 DIAGNOSIS — N898 Other specified noninflammatory disorders of vagina: Secondary | ICD-10-CM

## 2018-05-29 DIAGNOSIS — E119 Type 2 diabetes mellitus without complications: Secondary | ICD-10-CM | POA: Insufficient documentation

## 2018-05-29 DIAGNOSIS — R600 Localized edema: Secondary | ICD-10-CM

## 2018-05-29 DIAGNOSIS — R079 Chest pain, unspecified: Secondary | ICD-10-CM

## 2018-05-29 DIAGNOSIS — R0789 Other chest pain: Secondary | ICD-10-CM | POA: Insufficient documentation

## 2018-05-29 DIAGNOSIS — Z79899 Other long term (current) drug therapy: Secondary | ICD-10-CM | POA: Diagnosis not present

## 2018-05-29 DIAGNOSIS — R1084 Generalized abdominal pain: Secondary | ICD-10-CM

## 2018-05-29 LAB — BASIC METABOLIC PANEL
ANION GAP: 11 (ref 5–15)
BUN: 7 mg/dL (ref 6–20)
CO2: 26 mmol/L (ref 22–32)
Calcium: 9.1 mg/dL (ref 8.9–10.3)
Chloride: 104 mmol/L (ref 98–111)
Creatinine, Ser: 0.76 mg/dL (ref 0.44–1.00)
GLUCOSE: 121 mg/dL — AB (ref 70–99)
POTASSIUM: 4.1 mmol/L (ref 3.5–5.1)
SODIUM: 141 mmol/L (ref 135–145)

## 2018-05-29 LAB — CBC
HEMATOCRIT: 41.6 % (ref 36.0–46.0)
HEMOGLOBIN: 13.8 g/dL (ref 12.0–15.0)
MCH: 31 pg (ref 26.0–34.0)
MCHC: 33.2 g/dL (ref 30.0–36.0)
MCV: 93.5 fL (ref 78.0–100.0)
Platelets: 274 10*3/uL (ref 150–400)
RBC: 4.45 MIL/uL (ref 3.87–5.11)
RDW: 11.9 % (ref 11.5–15.5)
WBC: 8.4 10*3/uL (ref 4.0–10.5)

## 2018-05-29 LAB — I-STAT BETA HCG BLOOD, ED (MC, WL, AP ONLY): I-stat hCG, quantitative: <5 m[IU]/mL (ref <5)

## 2018-05-29 LAB — I-STAT TROPONIN, ED: Troponin i, poc: 0 ng/mL (ref 0.00–0.08)

## 2018-05-29 MED ORDER — ACETAMINOPHEN 500 MG PO TABS
1000.0000 mg | ORAL_TABLET | Freq: Once | ORAL | Status: AC
  Administered 2018-05-29: 1000 mg via ORAL
  Filled 2018-05-29: qty 2

## 2018-05-29 NOTE — Discharge Instructions (Addendum)
Please take 1000 mg of Tylenol as needed for chest wall pain.

## 2018-05-29 NOTE — Progress Notes (Signed)
Subjective:        Tanya Terry is a 32 y.o. female here for a routine exam.  Current complaints: Chest pain that started this am after lifting daughter, and has continued with increased intensity.  Denies SOB or diaphoresis, but does have a headache.  Personal health questionnaire:  Is patient Ashkenazi Jewish, have a family history of breast and/or ovarian cancer: no Is there a family history of uterine cancer diagnosed at age < 34, gastrointestinal cancer, urinary tract cancer, family member who is a Personnel officer syndrome-associated carrier: no Is the patient overweight and hypertensive, family history of diabetes, personal history of gestational diabetes, preeclampsia or PCOS: no Is patient over 50, have PCOS,  family history of premature CHD under age 60, diabetes, smoke, have hypertension or peripheral artery disease:  no At any time, has a partner hit, kicked or otherwise hurt or frightened you?: no Over the past 2 weeks, have you felt down, depressed or hopeless?: no Over the past 2 weeks, have you felt little interest or pleasure in doing things?:no   Gynecologic History Patient's last menstrual period was 05/07/2018. Contraception: none Last Pap: 2017. Results were: normal Last mammogram: n/a. Results were: n/a  Obstetric History OB History  Gravida Para Term Preterm AB Living  3 3 3  0 0 3  SAB TAB Ectopic Multiple Live Births  0 0 0 0 3    # Outcome Date GA Lbr Len/2nd Weight Sex Delivery Anes PTL Lv  3 Term 03/15/17 [redacted]w[redacted]d 00:25 7 lb 13.8 oz (3.565 kg) F Vag-Spont EPI  LIV  2 Term 12/15/11 [redacted]w[redacted]d 03:09 8 lb 3.8 oz (3.735 kg) M Vag-Spont Local  LIV     Birth Comments: wnl  1 Term 2006   7 lb 11 oz (3.487 kg) F Vag-Spont   LIV    Past Medical History:  Diagnosis Date  . Benign heart murmur   . Depression   . Diabetes mellitus without complication (HCC)   . GERD (gastroesophageal reflux disease)   . Peptic ulcer   . Ulcer    stomach (no longer taking meds)     Past Surgical History:  Procedure Laterality Date  . MYRINGOTOMY     with tubes  . UPPER GASTROINTESTINAL ENDOSCOPY    . WISDOM TOOTH EXTRACTION       Current Outpatient Medications:  .  amoxicillin (AMOXIL) 500 MG tablet, Take 500 mg by mouth 2 (two) times daily., Disp: , Rfl:  .  traMADol (ULTRAM) 50 MG tablet, Take by mouth every 6 (six) hours as needed., Disp: , Rfl:  .  acetaminophen (TYLENOL) 500 MG tablet, Take 500 mg by mouth every 6 (six) hours as needed for mild pain or moderate pain. , Disp: , Rfl:  .  escitalopram (LEXAPRO) 20 MG tablet, Take 1 tablet (20 mg total) by mouth daily. (Patient not taking: Reported on 05/29/2018), Disp: 90 tablet, Rfl: 1 .  ranitidine (ZANTAC) 150 MG tablet, Take 1 tablet (150 mg total) by mouth 2 (two) times daily. (Patient not taking: Reported on 05/29/2018), Disp: 60 tablet, Rfl: 3 .  senna-docusate (SENOKOT-S) 8.6-50 MG tablet, Take 2 tablets by mouth at bedtime. (Patient not taking: Reported on 04/07/2017), Disp: 60 tablet, Rfl: 2 Allergies  Allergen Reactions  . Augmentin [Amoxicillin-Pot Clavulanate] Anaphylaxis, Shortness Of Breath and Swelling    Has patient had a PCN reaction causing immediate rash, facial/tongue/throat swelling, SOB or lightheadedness with hypotension: Yes Has patient had a PCN reaction causing severe rash involving  mucus membranes or skin necrosis: Yes Has patient had a PCN reaction that required hospitalization No Has patient had a PCN reaction occurring within the last 10 years: Yes If all of the above answers are "NO", then may proceed with Cephalosporin use.   . Sulfa Antibiotics Anaphylaxis, Shortness Of Breath and Swelling    Social History   Tobacco Use  . Smoking status: Former Smoker    Types: Cigarettes    Last attempt to quit: 10/04/2012    Years since quitting: 5.6  . Smokeless tobacco: Never Used  Substance Use Topics  . Alcohol use: Not Currently    Family History  Problem Relation Age of Onset   . Diabetes Mother   . Hypertension Mother   . Heart disease Father   . Hypertension Father   . Anesthesia problems Neg Hx   . Hypotension Neg Hx   . Malignant hyperthermia Neg Hx   . Pseudochol deficiency Neg Hx       Review of Systems  Constitutional: negative for fatigue and weight loss Respiratory: negative for cough and wheezing Cardiovascular: positive for chest pain Gastrointestinal: negative for abdominal pain and change in bowel habits Musculoskeletal:negative for myalgias Neurological: negative for gait problems and tremors Behavioral/Psych: negative for abusive relationship, depression Endocrine: negative for temperature intolerance    Genitourinary:negative for abnormal menstrual periods, genital lesions, hot flashes, sexual problems and vaginal discharge Integument/breast: negative for breast lump, breast tenderness, nipple discharge and skin lesion(s)    Objective:       BP 108/76   Pulse (!) 58   Ht 5\' 7"  (1.702 m)   Wt 272 lb (123.4 kg)   LMP 05/07/2018   Breastfeeding? No   BMI 42.60 kg/m  General:   alert  Skin:   no rash or abnormalities  Lungs:   clear to auscultation bilaterally  Heart:   regular rate and rhythm, S1, S2 normal, no murmur, click, rub or gallop  Breasts:   normal without suspicious masses, skin or nipple changes or axillary nodes  Abdomen:  normal findings: no organomegaly, soft, non-tender and no hernia  Pelvis:  External genitalia: normal general appearance Urinary system: urethral meatus normal and bladder without fullness, nontender Vaginal: normal without tenderness, induration or masses Cervix: normal appearance Adnexa: normal bimanual exam Uterus: anteverted and non-tender, normal size   Lab Review Urine pregnancy test Labs reviewed yes Radiologic studies reviewed yes  50% of 20 min visit spent on counseling and coordination of care.   Assessment:     1. Encounter for routine gynecological examination with  Papanicolaou smear of cervix Rx: - Cytology - PAP  2. Generalized abdominal pain - stable  3. Pedal edema - normal exam  4. Chest pain, unspecified type - vital signs stable -EMS CALLED  5. Vaginal discharge Rx: - Cervicovaginal ancillary only    Plan:    Education reviewed: calcium supplements, depression evaluation, low fat, low cholesterol diet, safe sex/STD prevention, self breast exams and weight bearing exercise. Contraception: none. Follow up in: 1 year.   No orders of the defined types were placed in this encounter.  No orders of the defined types were placed in this encounter.   Brock BadHARLES A. HARPER MD 05-29-2018

## 2018-05-29 NOTE — ED Provider Notes (Signed)
[facility] EMERGENCY DEPARTMENT Provider Note   CSN: [id number] Arrival date & time: 05/29/18  1150   History   Chief Complaint Chief Complaint  Patient presents with  . Chest Pain    HPI Tanya Terry is a 32 y.o. female with a history of depression, DM, GERD, peptic ulcer who presents with chest pain.  Patient reports that she had an abrupt onset of left-sided chest pain 7 hours prior to presentation. She states that the pain began right after lifting her daughter. She went to her OBGYN for a routine check-up and was told to come to the ED. Patient also reports SOB this morning but currently does not feel short of breath. She does continue to endorse chest pain.   Of note, she says that she had 1 episode of bilateral leg swelling 1 week ago that self-resolved. She also had another episode yesterday and that too self-resolved. She also endorses 2-3 months of suprapubic pain. She denies diarrhea, constipation, dysuria, hematuria, fever.   She is currently on Amoxicillin for tooth infection but denies any other medication use including hormone therapy. She denies a previous history of PE/DVT and denies recent surgery/trauma.     Past Medical History:  Diagnosis Date  . Benign heart murmur   . Depression   . Diabetes mellitus without complication (HCC)   . GERD (gastroesophageal reflux disease)   . Peptic ulcer   . Ulcer    stomach (no longer taking meds)    Patient Active Problem List   Diagnosis Date Noted  . Gestational diabetes mellitus (GDM) affecting third pregnancy 03/14/2017  . Supervision of high risk pregnancy, antepartum 02/17/2017  . Gestational diabetes 01/20/2017    Past Surgical History:  Procedure Laterality Date  . MYRINGOTOMY     with tubes  . UPPER GASTROINTESTINAL ENDOSCOPY    . WISDOM TOOTH EXTRACTION       OB History    Gravida  3   Para  3   Term  3   Preterm  0   AB  0   Living  3     SAB  0   TAB  0   Ectopic  0   Multiple  0   Live Births  3            Home Medications    Prior to Admission medications   Medication Sig Start Date End Date Taking? Authorizing Provider  acetaminophen (TYLENOL) 500 MG tablet Take 500 mg by mouth every 6 (six) hours as needed for mild pain or moderate pain.     [provider]  amoxicillin (AMOXIL) 500 MG tablet Take 500 mg by mouth 2 (two) times daily.    [provider]  escitalopram (LEXAPRO) 20 MG tablet Take 1 tablet (20 mg total) by mouth daily. Patient not taking: Reported on 05/29/2018 05/08/18   Claiborne Rigg, NP  ranitidine (ZANTAC) 150 MG tablet Take 1 tablet (150 mg total) by mouth 2 (two) times daily. Patient not taking: Reported on 05/29/2018 05/08/18   Claiborne Rigg, NP  senna-docusate (SENOKOT-S) 8.6-50 MG tablet Take 2 tablets by mouth at bedtime. Patient not taking: Reported on 04/07/2017 03/17/17   Orvilla Cornwall A, CNM  traMADol (ULTRAM) 50 MG tablet Take by mouth every 6 (six) hours as needed.    [provider]    Family History Family History  Problem Relation Age of Onset  . Diabetes Mother   . Hypertension Mother   .  Heart disease Father   . Hypertension Father   . Anesthesia problems Neg Hx   . Hypotension Neg Hx   . Malignant hyperthermia Neg Hx   . Pseudochol deficiency Neg Hx     Social History Social History   Tobacco Use  . Smoking status: Former Smoker    Types: Cigarettes    Last attempt to quit: 10/04/2012    Years since quitting: 5.6  . Smokeless tobacco: Never Used  Substance Use Topics  . Alcohol use: Not Currently  . Drug use: Yes    Types: Marijuana     Allergies   Augmentin [amoxicillin-pot clavulanate] and Sulfa antibiotics   Review of Systems Review of Systems  Constitutional: Negative.   HENT: Positive for dental problem. Negative for congestion and sore throat.   Respiratory: Positive for shortness of breath. Negative for cough and chest tightness.     Cardiovascular: Positive for chest pain and leg swelling. Negative for palpitations.  Gastrointestinal: Positive for abdominal pain. Negative for abdominal distention, constipation, diarrhea, nausea and vomiting.  Genitourinary: Negative for dysuria, hematuria and urgency.  Skin: Negative for rash.  Neurological: Positive for headaches. Negative for dizziness, weakness and light-headedness.  All other systems reviewed and are negative.    Physical Exam Updated Vital Signs BP (!) 155/68 (BP Location: Right Arm)   Pulse 72   Temp 98 F (36.7 C) (Oral)   Resp 18   LMP 05/07/2018   SpO2 98%   Physical Exam  Constitutional: She appears well-developed and well-nourished.  HENT:  Head: Normocephalic and atraumatic.  Eyes: EOM are normal.  Neck: Normal range of motion.  Cardiovascular: Normal rate, regular rhythm, intact distal pulses and normal pulses.  Pulmonary/Chest: Effort normal and breath sounds normal.  Abdominal: Soft.  Musculoskeletal:       Right lower leg: Normal.       Left lower leg: Normal.  Tenderness to palpation of left chest wall.   Neurological: She is alert.  Skin: Skin is warm and dry.  Psychiatric: She has a normal mood and affect. Her behavior is normal.     ED Treatments / Results  Labs (all labs ordered are listed, but only abnormal results are displayed) Labs Reviewed  BASIC METABOLIC PANEL - Abnormal; Notable for the following components:      Result Value   Glucose, Bld 121 (*)    All other components within normal limits  CBC  I-STAT TROPONIN, ED  I-STAT BETA HCG BLOOD, ED ([facility], [facility], [facility] ONLY)    EKG EKG Interpretation  Date/Time:  Monday May 29 2018 12:04:04 EDT Ventricular Rate:  73 PR Interval:  150 QRS Duration: 90 QT Interval:  408 QTC Calculation: 449 R Axis:   38 Text Interpretation:  Normal sinus rhythm with sinus arrhythmia Normal ECG Confirmed by Jacalyn Lefevre [id number] on 05/29/2018 4:17:05 PM   Radiology Dg Chest 2  View  Result Date: 05/29/2018 CLINICAL DATA:  Acute LEFT chest pain today. EXAM: CHEST - 2 VIEW COMPARISON:  10/24/2014 radiographs FINDINGS: The cardiomediastinal silhouette is unremarkable. Mild chronic peribronchial thickening again noted. There is no evidence of focal airspace disease, pulmonary edema, suspicious pulmonary nodule/mass, pleural effusion, or pneumothorax. No acute bony abnormalities are identified. IMPRESSION: No active cardiopulmonary disease. Electronically Signed   By: Harmon Pier M.D.   On: 05/29/2018 12:29    Procedures Procedures (including critical care time)  Medications Ordered in ED Medications  acetaminophen (TYLENOL) tablet 1,000 mg (has no administration in time range)  Initial Impression / Assessment and Plan / ED Course  I have reviewed the triage vital signs and the nursing notes.  Pertinent labs & imaging results that were available during my care of the patient were reviewed by me and considered in my medical decision making (see chart for details).  Zoe LanDansirae L Ritchey is a 32 y.o. female with a history of depression, DM, GERD, peptic ulcer who presents with chest pain after lifting her daughter. Patient's symptoms are most likely musculoskeletal in nature given tenderness to palpation of chest wall. Low suspicion of ACS given unremarkable EKG and negative troponin. PE also unlikely given normal vital signs, no history of PE/DVT or hormone use, and no LE swelling on exam. PERC rule criteria=0 (No need for further workup, as <2% chance of PE). Patient found to have an unremarkable CXR, negative Beta HCG, negative troponin, and unremarkable CBC/BMP. Patient given tylenol in the ER with some improvement in chest pain. Prior to discharge and patient was ambulating well and have normal VS's. Recommended tylenol PRN chest wall pain. Patient given return precautions and verbalized understanding/agreement.   Final Clinical Impressions(s) / ED Diagnoses   Final  diagnoses:  Chest wall pain    ED Discharge Orders    None       Synetta ShadowPrince, Jamila Slatten M, MD 05/29/18 1701    Jacalyn LefevreHaviland, Julie, MD 05/29/18 1919

## 2018-05-29 NOTE — Progress Notes (Signed)
Patient is in the office for annual, last pap 08-18-16. Pt complains of constant abdominal cramping for the last few months. Pt also reports swelling in feet.  Pt requests std testing. Pt also reports sharp chest pain this morning as she lifted her daughter, and again while driving here. Pt declines BC.

## 2018-05-29 NOTE — ED Triage Notes (Signed)
Pt in via EMS to triage, c/o chest pain that started this morning, went to her OBGYN and was told to come here, patient anxious for EMS, no distress on arrival

## 2018-05-29 NOTE — ED Notes (Signed)
Discharge instructions discussed with Pt. Pt verbalized understanding. Pt stable and ambulatory.    

## 2018-05-29 NOTE — ED Notes (Signed)
EDP at bedside discussing care

## 2018-05-30 LAB — CYTOLOGY - PAP
Diagnosis: NEGATIVE
HPV: NOT DETECTED

## 2018-05-30 LAB — CERVICOVAGINAL ANCILLARY ONLY
Bacterial vaginitis: POSITIVE — AB
CANDIDA VAGINITIS: NEGATIVE
CHLAMYDIA, DNA PROBE: NEGATIVE
NEISSERIA GONORRHEA: NEGATIVE
TRICH (WINDOWPATH): NEGATIVE

## 2018-05-31 ENCOUNTER — Telehealth: Payer: Self-pay

## 2018-05-31 ENCOUNTER — Other Ambulatory Visit: Payer: Self-pay | Admitting: Obstetrics

## 2018-05-31 DIAGNOSIS — N760 Acute vaginitis: Principal | ICD-10-CM

## 2018-05-31 DIAGNOSIS — B9689 Other specified bacterial agents as the cause of diseases classified elsewhere: Secondary | ICD-10-CM

## 2018-05-31 MED ORDER — SECNIDAZOLE 2 G PO PACK: 1.0000 | PACK | Freq: Once | ORAL | 2 refills | Status: AC

## 2018-05-31 NOTE — Telephone Encounter (Signed)
PA completed for [employer]  Approval # : [id number]

## 2018-06-09 ENCOUNTER — Other Ambulatory Visit: Payer: Medicaid Other | Admitting: Nurse Practitioner

## 2018-06-16 ENCOUNTER — Other Ambulatory Visit (HOSPITAL_COMMUNITY)
Admission: RE | Admit: 2018-06-16 | Discharge: 2018-06-16 | Disposition: A | Discharge status: Home / Self Care | Payer: Medicaid Other | Source: Ambulatory Visit | Attending: Obstetrics | Admitting: Obstetrics

## 2018-06-16 ENCOUNTER — Ambulatory Visit (INDEPENDENT_AMBULATORY_CARE_PROVIDER_SITE_OTHER): Payer: Medicaid Other | Admitting: Obstetrics

## 2018-06-16 ENCOUNTER — Encounter: Payer: Self-pay | Admitting: Obstetrics

## 2018-06-16 VITALS — BP 118/77 | HR 82 | Ht 67.0 in | Wt 268.5 lb

## 2018-06-16 DIAGNOSIS — N898 Other specified noninflammatory disorders of vagina: Secondary | ICD-10-CM

## 2018-06-16 DIAGNOSIS — K5901 Slow transit constipation: Secondary | ICD-10-CM | POA: Diagnosis not present

## 2018-06-16 DIAGNOSIS — Z01419 Encounter for gynecological examination (general) (routine) without abnormal findings: Secondary | ICD-10-CM | POA: Diagnosis not present

## 2018-06-16 DIAGNOSIS — K581 Irritable bowel syndrome with constipation: Secondary | ICD-10-CM

## 2018-06-16 DIAGNOSIS — R102 Pelvic and perineal pain: Secondary | ICD-10-CM | POA: Diagnosis not present

## 2018-06-16 DIAGNOSIS — Z0000 Encounter for general adult medical examination without abnormal findings: Secondary | ICD-10-CM

## 2018-06-16 MED ORDER — FLUCONAZOLE 150 MG PO TABS: 150.0000 mg | ORAL_TABLET | Freq: Once | ORAL | 2 refills | Status: AC

## 2018-06-16 MED ORDER — DICYCLOMINE HCL 20 MG PO TABS: 20.0000 mg | ORAL_TABLET | Freq: Three times a day (TID) | ORAL | 5 refills | Status: DC

## 2018-06-16 MED ORDER — DOCUSATE SODIUM 100 MG PO CAPS: 100.0000 mg | ORAL_CAPSULE | Freq: Two times a day (BID) | ORAL | 0 refills | Status: DC

## 2018-06-16 NOTE — Progress Notes (Signed)
Presents for lower abdominal pain 8-10/10 x 2 months.  She took Flagyl and now has a yeast infection x 1 week.

## 2018-06-16 NOTE — Addendum Note (Signed)
Addended by: Coral CeoHARPER, CHARLES A on: 06/16/2018 10:53 AM   Modules accepted: Orders

## 2018-06-16 NOTE — Progress Notes (Signed)
[name], female   DOB: 04-12-1986, 32 y.o.   MRN: [id number]  Chief Complaint  Patient presents with  . Abdominal Pain    HPI Tanya Terry is a 32 y.o. female.  Lower abdominal pain and constipation.  Vaginal itching after taking antibiotic. HPI  Past Medical History:  Diagnosis Date  . Benign heart murmur   . Depression   . Diabetes mellitus without complication (HCC)   . GERD (gastroesophageal reflux disease)   . Peptic ulcer   . Ulcer    stomach (no longer taking meds)    Past Surgical History:  Procedure Laterality Date  . MYRINGOTOMY     with tubes  . UPPER GASTROINTESTINAL ENDOSCOPY    . WISDOM TOOTH EXTRACTION      Family History  Problem Relation Age of Onset  . Diabetes Mother   . Hypertension Mother   . Heart disease Father   . Hypertension Father   . Anesthesia problems Neg Hx   . Hypotension Neg Hx   . Malignant hyperthermia Neg Hx   . Pseudochol deficiency Neg Hx     Social History Social History   Tobacco Use  . Smoking status: Former Smoker    Types: Cigarettes    Last attempt to quit: 10/04/2012    Years since quitting: 5.7  . Smokeless tobacco: Never Used  Substance Use Topics  . Alcohol use: Not Currently  . Drug use: Yes    Types: Marijuana    Allergies  Allergen Reactions  . Augmentin [Amoxicillin-Pot Clavulanate] Anaphylaxis, Shortness Of Breath and Swelling    Has patient had a PCN reaction causing immediate rash, facial/tongue/throat swelling, SOB or lightheadedness with hypotension: Yes Has patient had a PCN reaction causing severe rash involving mucus membranes or skin necrosis: Yes Has patient had a PCN reaction that required hospitalization No Has patient had a PCN reaction occurring within the last 10 years: Yes If all of the above answers are "NO", then may proceed with Cephalosporin use.   . Sulfa Antibiotics Anaphylaxis, Shortness Of Breath and Swelling    Current Outpatient Medications   Medication Sig Dispense Refill  . acetaminophen (TYLENOL) 500 MG tablet Take 500 mg by mouth every 6 (six) hours as needed for mild pain or moderate pain.     Marland Kitchen. amoxicillin (AMOXIL) 500 MG tablet Take 500 mg by mouth 2 (two) times daily.    Marland Kitchen. dicyclomine (BENTYL) 20 MG tablet Take 1 tablet (20 mg total) by mouth 3 (three) times daily before meals. 90 tablet 5  . docusate sodium (COLACE) 100 MG capsule Take 1 capsule (100 mg total) by mouth 2 (two) times daily. 10 capsule 0  . escitalopram (LEXAPRO) 20 MG tablet Take 1 tablet (20 mg total) by mouth daily. (Patient not taking: Reported on 05/29/2018) 90 tablet 1  . fluconazole (DIFLUCAN) 150 MG tablet Take 1 tablet (150 mg total) by mouth once for 1 dose. 1 tablet 2  . ranitidine (ZANTAC) 150 MG tablet Take 1 tablet (150 mg total) by mouth 2 (two) times daily. (Patient not taking: Reported on 05/29/2018) 60 tablet 3  . senna-docusate (SENOKOT-S) 8.6-50 MG tablet Take 2 tablets by mouth at bedtime. (Patient not taking: Reported on 04/07/2017) 60 tablet 2  . traMADol (ULTRAM) 50 MG tablet Take 50 mg by mouth every 6 (six) hours as needed for moderate pain.      No current facility-administered medications for this visit.     Review of Systems Review  of Systems Constitutional: negative for fatigue and weight loss Respiratory: negative for cough and wheezing Cardiovascular: negative for chest pain, fatigue and palpitations Gastrointestinal: POSITIVE for abdominal pain and constipation Genitourinary:POSITIVE for pelvic pain Integument/breast: negative for nipple discharge Musculoskeletal:negative for myalgias Neurological: negative for gait problems and tremors Behavioral/Psych: negative for abusive relationship, depression Endocrine: negative for temperature intolerance      Blood pressure 118/77, pulse 82, height 5\' 7"  (1.702 m), weight 268 lb 8 oz (121.8 kg), last menstrual period 06/12/2018, not currently breastfeeding.  Physical  Exam Physical Exam           General:  Alert and no distress Abdomen:  normal findings: no organomegaly, soft, non-tender and no hernia  Pelvis:  External genitalia: normal general appearance Urinary system: urethral meatus normal and bladder without fullness, nontender Vaginal: normal without tenderness, induration or masses Cervix: normal appearance Adnexa: normal bimanual exam Uterus: anteverted and non-tender, normal size    50% of 15 min visit spent on counseling and coordination of care.   Data Reviewed Wet Prep  Assessment     1. Pelvic pain Rx: - US PELVIC COMPLETE WITH TRANSVAGINAL; Future  2. Vaginal discharge  3. Vaginal pruritus Rx: - fluconazole (DIFLUCAN) 150 MG tablet; Take 1 tablet (150 mg total) by mouth once for 1 dose.  Dispense: 1 tablet; Refill: 2  4. Constipation by delayed colonic transit Rx: - Ambulatory referral to Gastroenterology - docusate sodium (COLACE) 100 MG capsule; Take 1 capsule (100 mg total) by mouth 2 (two) times daily.  Dispense: 10 capsule; Refill: 0  5. Irritable bowel syndrome with constipation Rx: - dicyclomine (BENTYL) 20 MG tablet; Take 1 tablet (20 mg total) by mouth 3 (three) times daily before meals.  Dispense: 90 tablet; Refill: 5  6. Routine adult health maintenance Rx: - Ambulatory referral to Internal Medicine    Plan    Follow up in 3 months  Orders Placed This Encounter  Procedures  . US PELVIC COMPLETE WITH TRANSVAGINAL    Standing Status:   Future    Standing Expiration Date:   08/17/2019    Order Specific Question:   Reason for Exam (SYMPTOM  OR DIAGNOSIS REQUIRED)    Answer:   pelvic pain    Order Specific Question:   Preferred imaging location?    Answer:   [facility]  . Ambulatory referral to Gastroenterology    Referral Priority:   Routine    Referral Type:   Consultation    Referral Reason:   Specialty Services Required    Number of Visits Requested:   1  . Ambulatory referral to Internal  Medicine    Referral Priority:   Routine    Referral Type:   Consultation    Referral Reason:   Specialty Services Required    Requested Specialty:   Internal Medicine    Number of Visits Requested:   1   Meds ordered this encounter  Medications  . docusate sodium (COLACE) 100 MG capsule    Sig: Take 1 capsule (100 mg total) by mouth 2 (two) times daily.    Dispense:  10 capsule    Refill:  0  . fluconazole (DIFLUCAN) 150 MG tablet    Sig: Take 1 tablet (150 mg total) by mouth once for 1 dose.    Dispense:  1 tablet    Refill:  2  . dicyclomine (BENTYL) 20 MG tablet    Sig: Take 1 tablet (20 mg total) by mouth 3 (three) times  daily before meals.    Dispense:  90 tablet    Refill:  5    Brock Bad MD 06-16-2018

## 2018-06-19 LAB — CERVICOVAGINAL ANCILLARY ONLY
Bacterial vaginitis: NEGATIVE
CHLAMYDIA, DNA PROBE: NEGATIVE
Candida vaginitis: NEGATIVE
Neisseria Gonorrhea: NEGATIVE
Trichomonas: NEGATIVE

## 2018-06-20 ENCOUNTER — Ambulatory Visit (HOSPITAL_COMMUNITY)
Admission: RE | Admit: 2018-06-20 | Discharge: 2018-06-20 | Disposition: A | Discharge status: Home / Self Care | Payer: Medicaid Other | Source: Ambulatory Visit | Attending: Obstetrics | Admitting: Obstetrics

## 2018-06-20 DIAGNOSIS — R102 Pelvic and perineal pain: Secondary | ICD-10-CM | POA: Insufficient documentation

## 2018-06-21 ENCOUNTER — Encounter: Payer: Self-pay | Admitting: Nurse Practitioner

## 2018-07-04 ENCOUNTER — Ambulatory Visit: Payer: Medicaid Other | Admitting: Obstetrics

## 2018-07-04 ENCOUNTER — Ambulatory Visit (INDEPENDENT_AMBULATORY_CARE_PROVIDER_SITE_OTHER): Payer: Medicaid Other | Admitting: Obstetrics

## 2018-07-04 ENCOUNTER — Telehealth: Payer: Self-pay

## 2018-07-04 ENCOUNTER — Encounter: Payer: Self-pay | Admitting: Obstetrics

## 2018-07-04 VITALS — BP 116/78 | HR 76 | Wt 269.0 lb

## 2018-07-04 DIAGNOSIS — R102 Pelvic and perineal pain: Secondary | ICD-10-CM

## 2018-07-04 DIAGNOSIS — K581 Irritable bowel syndrome with constipation: Secondary | ICD-10-CM

## 2018-07-04 NOTE — Telephone Encounter (Signed)
Pt was seen today.  Called requesting Rx for heartburn.  Please advise.

## 2018-07-04 NOTE — Progress Notes (Signed)
[name], female   DOB: [birth year], 32 y.o.   MRN: [id number]  Chief Complaint  Patient presents with  . Follow-up    u/s results    HPI Tanya Terry is a 32 y.o. female.  History of pelvic pain and chronic constipation.  Ultrasound done.  Presents today for results. HPI  Past Medical History:  Diagnosis Date  . Benign heart murmur   . Depression   . Diabetes mellitus without complication (HCC)   . GERD (gastroesophageal reflux disease)   . Peptic ulcer   . Ulcer    stomach (no longer taking meds)    Past Surgical History:  Procedure Laterality Date  . MYRINGOTOMY     with tubes  . UPPER GASTROINTESTINAL ENDOSCOPY    . WISDOM TOOTH EXTRACTION      Family History  Problem Relation Age of Onset  . Diabetes Mother   . Hypertension Mother   . Heart disease Father   . Hypertension Father   . Anesthesia problems Neg Hx   . Hypotension Neg Hx   . Malignant hyperthermia Neg Hx   . Pseudochol deficiency Neg Hx     Social History Social History   Tobacco Use  . Smoking status: Former Smoker    Types: Cigarettes    Last attempt to quit: 10/04/2012    Years since quitting: 5.7  . Smokeless tobacco: Never Used  Substance Use Topics  . Alcohol use: Not Currently  . Drug use: Yes    Types: Marijuana    Allergies  Allergen Reactions  . Augmentin [Amoxicillin-Pot Clavulanate] Anaphylaxis, Shortness Of Breath and Swelling    Has patient had a PCN reaction causing immediate rash, facial/tongue/throat swelling, SOB or lightheadedness with hypotension: Yes Has patient had a PCN reaction causing severe rash involving mucus membranes or skin necrosis: Yes Has patient had a PCN reaction that required hospitalization No Has patient had a PCN reaction occurring within the last 10 years: Yes If all of the above answers are "NO", then may proceed with Cephalosporin use.   . Sulfa Antibiotics Anaphylaxis, Shortness Of Breath and Swelling    Current  Outpatient Medications  Medication Sig Dispense Refill  . acetaminophen (TYLENOL) 500 MG tablet Take 500 mg by mouth every 6 (six) hours as needed for mild pain or moderate pain.     Marland Kitchen amoxicillin (AMOXIL) 500 MG tablet Take 500 mg by mouth 2 (two) times daily.    Marland Kitchen dicyclomine (BENTYL) 20 MG tablet Take 1 tablet (20 mg total) by mouth 3 (three) times daily before meals. 90 tablet 5  . docusate sodium (COLACE) 100 MG capsule Take 1 capsule (100 mg total) by mouth 2 (two) times daily. 10 capsule 0  . escitalopram (LEXAPRO) 20 MG tablet Take 1 tablet (20 mg total) by mouth daily. (Patient not taking: Reported on 05/29/2018) 90 tablet 1  . ranitidine (ZANTAC) 150 MG tablet Take 1 tablet (150 mg total) by mouth 2 (two) times daily. (Patient not taking: Reported on 05/29/2018) 60 tablet 3  . senna-docusate (SENOKOT-S) 8.6-50 MG tablet Take 2 tablets by mouth at bedtime. (Patient not taking: Reported on 04/07/2017) 60 tablet 2  . traMADol (ULTRAM) 50 MG tablet Take 50 mg by mouth every 6 (six) hours as needed for moderate pain.      No current facility-administered medications for this visit.     Review of Systems Review of Systems Constitutional: negative for fatigue and weight loss Respiratory: negative for cough and  wheezing Cardiovascular: negative for chest pain, fatigue and palpitations Gastrointestinal: negative for abdominal pain and change in bowel habits Genitourinary:negative Integument/breast: negative for nipple discharge Musculoskeletal:negative for myalgias Neurological: negative for gait problems and tremors Behavioral/Psych: negative for abusive relationship, depression Endocrine: negative for temperature intolerance      Blood pressure 116/78, pulse 76, weight 269 lb (122 kg), last menstrual period 06/12/2018, not currently breastfeeding.  Physical Exam Physical Exam:  Deferred  >50% of 10 min visit spent on counseling and coordination of care.   Data  Reviewed Ultrasound: US PELVIC COMPLETE WITH TRANSVAGINAL (Accession [id number]) (Order [id number])  Imaging  Date: 06/20/2018 [facility] Released By: Johnny BridgeMoore, Heather B, NT Authorizing: Brock BadHarper, Tramar Brueckner A, MD  Exam Information   Status Exam Begun  Exam Ended   Final [99] 06/20/2018 2:59 PM 06/20/2018 3:29 PM  PACS Images   Show images for US PELVIC COMPLETE WITH TRANSVAGINAL  Study Result   CLINICAL DATA:  Pelvic pain for 2 months.  EXAM: TRANSABDOMINAL AND TRANSVAGINAL ULTRASOUND OF PELVIS  TECHNIQUE: Both transabdominal and transvaginal ultrasound examinations of the pelvis were performed. Transabdominal technique was performed for global imaging of the pelvis including uterus, ovaries, adnexal regions, and pelvic cul-de-sac. It was necessary to proceed with endovaginal exam following the transabdominal exam to visualize the endometrium and ovaries.  COMPARISON:  None  FINDINGS: Uterus  Measurements: 9.3 x 3.8 x 5.7 cm. No fibroids or other mass visualized.  Endometrium  Thickness: 6 mm.  No focal abnormality visualized.  Right ovary  Measurements: 3.3 x 2.8 x 1.9 cm. Normal appearance/no adnexal mass.  Left ovary  Measurements: 4.0 x 2.7 x 2.5 cm. Normal appearance/no adnexal mass.  Other findings  No abnormal free fluid.  IMPRESSION: Negative. No pelvic mass or other significant abnormality identified.   Electronically Signed   By: Myles RosenthalJohn  Stahl M.D.   On: 06/20/2018 15:39    Assessment     1. Pelvic pain - negative ultrasound  2. Irritable bowel syndrome with constipation - probable source of pelvic pain - Bentyl Rx and patient referred to GI    Plan    Follow up prn  No orders of the defined types were placed in this encounter.  No orders of the defined types were placed in this encounter.   Brock BadHARLES A. Jadeyn Hargett MD 07-04-2018

## 2018-07-05 ENCOUNTER — Other Ambulatory Visit: Payer: Self-pay | Admitting: Obstetrics

## 2018-07-05 DIAGNOSIS — K219 Gastro-esophageal reflux disease without esophagitis: Secondary | ICD-10-CM

## 2018-07-05 MED ORDER — OMEPRAZOLE 20 MG PO CPDR: 20.0000 mg | DELAYED_RELEASE_CAPSULE | Freq: Two times a day (BID) | ORAL | 5 refills | Status: DC

## 2018-07-05 NOTE — Telephone Encounter (Signed)
Omeprazole Rx for heartburn

## 2018-07-05 NOTE — Telephone Encounter (Signed)
Pt notified. Pt voiced understanding  

## 2018-07-12 ENCOUNTER — Encounter: Payer: Self-pay | Admitting: Nurse Practitioner

## 2018-07-12 ENCOUNTER — Ambulatory Visit: Payer: Medicaid Other | Admitting: Nurse Practitioner

## 2018-07-12 VITALS — BP 110/80 | HR 64 | Ht 67.0 in | Wt 267.3 lb

## 2018-07-12 DIAGNOSIS — K581 Irritable bowel syndrome with constipation: Secondary | ICD-10-CM | POA: Diagnosis not present

## 2018-07-12 DIAGNOSIS — K219 Gastro-esophageal reflux disease without esophagitis: Secondary | ICD-10-CM

## 2018-07-12 DIAGNOSIS — K5909 Other constipation: Secondary | ICD-10-CM | POA: Diagnosis not present

## 2018-07-12 MED ORDER — DICYCLOMINE HCL 20 MG PO TABS
20.0000 mg | ORAL_TABLET | Freq: Two times a day (BID) | ORAL | 5 refills | Status: DC | PRN
Start: 2018-07-12 — End: 2019-10-31

## 2018-07-12 MED ORDER — LUBIPROSTONE 24 MCG PO CAPS: 24.0000 ug | ORAL_CAPSULE | Freq: Two times a day (BID) | ORAL | 1 refills | Status: DC

## 2018-07-12 NOTE — Progress Notes (Signed)
GI Provider:   New           Chief Complaint:  Constipation, abdominal pain   Referring Provider:   Coral Ceo for IBS with constipation   ASSESSMENT AND PLAN;   73.  32 year old female with chronic constipation, abdominal pain, nausea, vomiting.  She drinks plenty of water, inadequate results with twice daily MiraLAX. -I would like to purge bowels today with magnesium Citrate then trial of  Amitiza if insurance will cover (she will let us know) -Until constipation adequately treated it is difficult to evaluate the diffuse abdominal pain, initially since it gets worse with defecation -I will bring her back to see me in approximately 4 weeks for reevaluation.  No alarm features.  She is had no rectal bleeding, abdominal exam is unremarkable, serum chemistries in the last 4 months were normal.  CBC less than 2 months ago was normal. -Dicyclomine helps her abdominal pain but she is taking it 3 times a day which is likely contributing to the worsening constipation.  I asked her to reduce dose to 1-2 times a day only as needed -Continue with good hydration  2. GERD, suboptimally controlled despite twice daily PPI -Discussed antireflux measures, written literature given. -Advised a wedge pillow -Weight loss may help  HPI:    Patient is a 32 year old female who gives a long-standing history of chronic GI problems.  She has had constipation for years.  She has a bowel movement about once a week, stools are generally mushy alternating with hard stools and she never feels completely evacuated.  In the past she has tried MiraLAX twice daily for months without improvement.  She has taken over-the-counter laxatives but has to take so many at a time to get results.  Warm prune juice helps but she has to drink a large amount.  She drinks plenty of water during the day.  No blood in her stool.  Over the last several months patient has also developed generalized abdominal pain and frequent nausea,  vomiting, worse when trying to have a bowel movement.  Pain generally improved after defecation which is not very often given the constipation that she has.  Pain is not related to eating.  PCP gave her Bentyl which she has been taking 3 times a day.  The Bentyl has helped the pain but constipation has gotten worse.    She has a remote history of H. pylori related gastritis in 2003.  She was treated with antibiotics.  A follow-up EGD in October 2003 by Dr. Dortha Kern showed debris in the fundus and the mid esophagus with some reflux.  Gastric biopsies were taken to check for eradication of H. Pylori.. It has been so long ago the biopsy results are not available  Patient has chronic GERD, she takes a PPI twice daily but still gets frequent heartburn.   Past Medical History:  Diagnosis Date  . Benign heart murmur   . Depression   . Diabetes mellitus without complication (HCC)   . GERD (gastroesophageal reflux disease)   . Peptic ulcer   . Ulcer    stomach (no longer taking meds)     Past Surgical History:  Procedure Laterality Date  . MYRINGOTOMY     with tubes  . UPPER GASTROINTESTINAL ENDOSCOPY    . WISDOM TOOTH EXTRACTION     Family History  Problem Relation Age of Onset  . Diabetes Mother   . Hypertension Mother   . Heart disease Father   .  Hypertension Father   . Anesthesia problems Neg Hx   . Hypotension Neg Hx   . Malignant hyperthermia Neg Hx   . Pseudochol deficiency Neg Hx    Social History   Tobacco Use  . Smoking status: Former Smoker    Types: Cigarettes    Last attempt to quit: 10/04/2012    Years since quitting: 5.7  . Smokeless tobacco: Never Used  Substance Use Topics  . Alcohol use: Not Currently  . Drug use: Yes    Types: Marijuana   Current Outpatient Medications  Medication Sig Dispense Refill  . acetaminophen (TYLENOL) 500 MG tablet Take 500 mg by mouth every 6 (six) hours as needed for mild pain or moderate pain.     Marland Kitchen dicyclomine (BENTYL) 20  MG tablet Take 1 tablet (20 mg total) by mouth 3 (three) times daily before meals. 90 tablet 5  . docusate sodium (COLACE) 100 MG capsule Take 1 capsule (100 mg total) by mouth 2 (two) times daily. 10 capsule 0  . traMADol (ULTRAM) 50 MG tablet Take 50 mg by mouth every 6 (six) hours as needed for moderate pain.      No current facility-administered medications for this visit.    Allergies  Allergen Reactions  . Augmentin [Amoxicillin-Pot Clavulanate] Anaphylaxis, Shortness Of Breath and Swelling    Has patient had a PCN reaction causing immediate rash, facial/tongue/throat swelling, SOB or lightheadedness with hypotension: Yes Has patient had a PCN reaction causing severe rash involving mucus membranes or skin necrosis: Yes Has patient had a PCN reaction that required hospitalization No Has patient had a PCN reaction occurring within the last 10 years: Yes If all of the above answers are "NO", then may proceed with Cephalosporin use.   . Sulfa Antibiotics Anaphylaxis, Shortness Of Breath and Swelling     Review of Systems: Positive for anxiety, back pain, depression, menstrual pain, sore throat and swelling of feet and legs.  All systems reviewed and negative except where noted in HPI.   Creatinine clearance cannot be calculated (Patient's most recent lab result is older than the maximum 21 days allowed.)   Physical Exam:    Wt Readings from Last 3 Encounters:  07/12/18 267 lb 4.8 oz (121.2 kg)  07/04/18 269 lb (122 kg)  06/16/18 268 lb 8 oz (121.8 kg)    BP 110/80   Pulse 64   Ht 5\' 7"  (1.702 m)   Wt 267 lb 4.8 oz (121.2 kg)   LMP 06/12/2018 (Approximate)   BMI 41.87 kg/m  Constitutional:  Pleasant female in no acute distress. Teary eyed talking about GI issues Psychiatric: Normal mood and affect. Behavior is normal. EENT: Pupils normal.  Conjunctivae are normal. No scleral icterus. Neck supple.  Cardiovascular: Normal rate, regular rhythm. No edema Pulmonary/chest:  Effort normal and breath sounds normal. No wheezing, rales or rhonchi. Abdominal: Soft, nondistended, nontender. Bowel sounds active throughout. There are no masses palpable. No hepatomegaly. Neurological: Alert and oriented to person place and time. Skin: Skin is warm and dry. No rashes noted.  Willette Cluster, NP  07/12/2018, 8:45 AM  Cc: Coral Ceo, MD

## 2018-07-12 NOTE — Patient Instructions (Addendum)
If you are age 32 or older, your body mass index should be between 23-30. Your Body mass index is 41.87 kg/m. If this is out of the aforementioned range listed, please consider follow up with your Primary Care Provider.  If you are age 95 or younger, your body mass index should be between 19-25. Your Body mass index is 41.87 kg/m. If this is out of the aformentioned range listed, please consider follow up with your Primary Care Provider.   We have sent the following medications to your pharmacy for you to pick up at your convenience: Amitiza Bentyl  Bowel Purge:  Magnesium Citrate - instuctions on bottle.  Use 1/2 bottle today and the remaining half in the morning.  Use wedge pillow.  You have been given GERD Literature handout.  Follow up with me in four weeks.  Call the office for an appointment as the schedule is not available at this time.  Thank you for choosing me and [facility].   Willette Cluster, NP

## 2018-07-12 NOTE — Progress Notes (Signed)
Agree with assessment and plan as outlined.  

## 2018-08-01 ENCOUNTER — Telehealth: Payer: Self-pay | Admitting: Nurse Practitioner

## 2018-08-01 NOTE — Telephone Encounter (Signed)
Tanya Terry, if she thinks it is causing side effects then needs to discontinue.  Can we try Linzess 145 mcg once daily 30 minutes before breakfast.  Give a few days in between stopping Amitiza and starting Linzess to see if she develops some of the same symptoms off medication.  This may help further clarify whether symptoms are definitely related to the Amitiza.  If symptoms subside then start the Linzess and we will see what happens with that . thanks

## 2018-08-01 NOTE — Telephone Encounter (Signed)
She will develop symptoms of chest tightness, pounding heart and headaches about 2 hours after she takes Amitiza. She says the worst part is the headache. She has been taking OTC medication for the pain. That helps a little. She is having improvement with the bowel movements though. Please advise.

## 2018-08-01 NOTE — Telephone Encounter (Signed)
Patient states she thinks the medication Tanya Terry is making her have chest pain and headaches. Patient is requesting to speak with nurse about symptoms.

## 2018-08-02 NOTE — Telephone Encounter (Signed)
Patient is advised and instructed. She will pick up samples to try and let us know if this works for her.

## 2018-08-09 ENCOUNTER — Ambulatory Visit: Payer: Medicaid Other | Admitting: Obstetrics

## 2018-08-14 ENCOUNTER — Ambulatory Visit: Payer: Medicaid Other | Admitting: Obstetrics

## 2018-08-21 ENCOUNTER — Telehealth: Payer: Self-pay | Admitting: Nurse Practitioner

## 2018-08-21 ENCOUNTER — Other Ambulatory Visit: Payer: Self-pay

## 2018-08-21 DIAGNOSIS — K581 Irritable bowel syndrome with constipation: Secondary | ICD-10-CM

## 2018-08-21 MED ORDER — LINACLOTIDE 145 MCG PO CAPS: 145.0000 ug | ORAL_CAPSULE | Freq: Every day | ORAL | 11 refills | Status: DC

## 2018-08-21 NOTE — Telephone Encounter (Signed)
Spoke with the patient. She feel the Linzess is helping. Prescription sent to her pharmacy. Follow up appointment scheduled.

## 2018-08-21 NOTE — Telephone Encounter (Signed)
Patient states medication linzess is working and would like a prescription sent into [facility]. Patient also requesting call from nurse to discuss next steps and if she needs a follow up ov or not.

## 2018-08-29 ENCOUNTER — Telehealth: Payer: Self-pay | Admitting: Nurse Practitioner

## 2018-08-29 ENCOUNTER — Ambulatory Visit: Payer: Medicaid Other | Admitting: Gastroenterology

## 2018-08-29 NOTE — Telephone Encounter (Signed)
The patient calls with complaints of abdominal pain. "My stomach hurts, it's in knots." She started having enough discomfort this weekend that she had to leave work. She is moving her bowels. Due follow up. Asks to be seen earlier due to the abdominal pain. Patient is on Linzess. Appointment scheduled for evaluation.

## 2018-08-29 NOTE — Telephone Encounter (Signed)
Pt stated that she is in an uncomfortable pain in her stomach. Would like some advise.

## 2018-09-06 ENCOUNTER — Ambulatory Visit: Payer: Medicaid Other | Admitting: Nurse Practitioner

## 2018-12-18 ENCOUNTER — Telehealth: Payer: Self-pay | Admitting: Nurse Practitioner

## 2018-12-18 NOTE — Telephone Encounter (Signed)
Pt would like to speak to someone about her stomach pain she is currently having. Already scheduled her for a follow up with Dr. Gunnar Fusi.

## 2018-12-19 NOTE — Telephone Encounter (Signed)
Left message to call back  

## 2018-12-20 NOTE — Telephone Encounter (Signed)
I think she was previously on PPI when she saw Gunnar Fusi, not sure if she stopped it. If she has any at home she can resume it. IF nothing, can try OTC pepcid 20mg  BID to see if that helps. Thanks

## 2018-12-20 NOTE — Telephone Encounter (Signed)
Patient is doing well with resolution of her constipation. She continues to have an empty almost like she is starving, sensation in her stomach. She says she is not hungry, but hunger is the only way she can describe the pain. She is not taking anything for the stomach. Confirmed she is not referring to abdminal cramps. She is not on Zantac or any other H2 or any PPI. Her appointment is 12/28/2018. Can she try an OTC until she is seen?

## 2018-12-21 NOTE — Telephone Encounter (Signed)
Patient is advised.  

## 2018-12-22 ENCOUNTER — Emergency Department (HOSPITAL_COMMUNITY)
Admission: EM | Admit: 2018-12-22 | Discharge: 2018-12-22 | Discharge status: Against Medical Advice | Payer: BLUE CROSS/BLUE SHIELD | Attending: Emergency Medicine | Admitting: Emergency Medicine

## 2018-12-22 ENCOUNTER — Emergency Department (HOSPITAL_COMMUNITY): Payer: BLUE CROSS/BLUE SHIELD

## 2018-12-22 DIAGNOSIS — R079 Chest pain, unspecified: Secondary | ICD-10-CM | POA: Insufficient documentation

## 2018-12-22 DIAGNOSIS — Z5321 Procedure and treatment not carried out due to patient leaving prior to being seen by health care provider: Secondary | ICD-10-CM | POA: Diagnosis not present

## 2018-12-22 LAB — CBC
HCT: 41.8 % (ref 36.0–46.0)
HEMOGLOBIN: 13.9 g/dL (ref 12.0–15.0)
MCH: 30.8 pg (ref 26.0–34.0)
MCHC: 33.3 g/dL (ref 30.0–36.0)
MCV: 92.5 fL (ref 80.0–100.0)
Platelets: 302 10*3/uL (ref 150–400)
RBC: 4.52 MIL/uL (ref 3.87–5.11)
RDW: 11.7 % (ref 11.5–15.5)
WBC: 10.5 10*3/uL (ref 4.0–10.5)
nRBC: 0 % (ref 0.0–0.2)

## 2018-12-22 LAB — BASIC METABOLIC PANEL
Anion gap: 6 (ref 5–15)
BUN: 8 mg/dL (ref 6–20)
CO2: 25 mmol/L (ref 22–32)
Calcium: 9.1 mg/dL (ref 8.9–10.3)
Chloride: 109 mmol/L (ref 98–111)
Creatinine, Ser: 0.68 mg/dL (ref 0.44–1.00)
GFR calc non Af Amer: >60 mL/min (ref >60)
Glucose, Bld: 89 mg/dL (ref 70–99)
POTASSIUM: 3.8 mmol/L (ref 3.5–5.1)
Sodium: 140 mmol/L (ref 135–145)

## 2018-12-22 LAB — I-STAT BETA HCG BLOOD, ED (MC, WL, AP ONLY): I-stat hCG, quantitative: <5 m[IU]/mL (ref <5)

## 2018-12-22 MED ORDER — SODIUM CHLORIDE 0.9% FLUSH: 3.0000 mL | Freq: Once | INTRAVENOUS | Status: DC

## 2018-12-22 NOTE — ED Triage Notes (Signed)
Pt to ER for central chest tightness onset today with shortness of breath. Pt a/o x4.

## 2018-12-22 NOTE — ED Notes (Signed)
No answer when called for vitals. 

## 2018-12-22 NOTE — ED Notes (Signed)
Comment on chart at 1716 that pt is LWBS per Leonard Schwartz, EMT

## 2018-12-28 ENCOUNTER — Ambulatory Visit (INDEPENDENT_AMBULATORY_CARE_PROVIDER_SITE_OTHER): Payer: BLUE CROSS/BLUE SHIELD | Admitting: Nurse Practitioner

## 2018-12-28 ENCOUNTER — Encounter: Payer: Self-pay | Admitting: Nurse Practitioner

## 2018-12-28 VITALS — BP 122/70 | HR 88 | Ht 67.0 in | Wt 254.0 lb

## 2018-12-28 DIAGNOSIS — R1013 Epigastric pain: Secondary | ICD-10-CM

## 2018-12-28 DIAGNOSIS — K219 Gastro-esophageal reflux disease without esophagitis: Secondary | ICD-10-CM | POA: Diagnosis not present

## 2018-12-28 DIAGNOSIS — K5909 Other constipation: Secondary | ICD-10-CM | POA: Diagnosis not present

## 2018-12-28 MED ORDER — OMEPRAZOLE 40 MG PO CPDR: 40.0000 mg | DELAYED_RELEASE_CAPSULE | ORAL | 5 refills | Status: DC

## 2018-12-28 NOTE — Progress Notes (Signed)
Chief Complaint:  Upper abdominal pain, chest pain     IMPRESSION and PLAN:    42.  33 year old female with 1 month history of epigastric burning.  She also reports nausea but this is more of a chronic intermittent problem.  Burning persist despite twice daily Pepcid.  CBC in ED 12/26/2018 was unremarkable.  She also reports burning in her chest is alleviated by Pepcid. Suspect pain functional in nature.  -Patient has insurance now so we will resume PPI therapy. Start omeprazole 40 mg daily.  She will discontinue Pepcid -We will arrange for EGD with Dr. Adela Lank. The risks and benefits of EGD were discussed and the patient agrees to proceed.   2. GERD, longstanding.  She has been off PPI for years due to lack of insurance.  She now has insurance coverage. -Omeprazole 40 mg every morning  3.  Intermittent chest pressure.  She described this a few months ago after starting Amitiza.  She had this again on 12/22/2018 and went to the ED.  Did not stay to be seen by EDP on that day.  She feels that chest discomfort is secondary to job-related stress.  Stress/anxiety could certainly be playing a role, GERD could be contributing as well especially since she has been off treatment until recently.   4.  Chronic constipation.  She is doing well on Linzess, having 3-4 bowel movements a week.  Could not tolerate Amitiza.     HPI:     Patient is a 33 year old female who I saw September 2019 for evaluation of GERD, abdominal pain, chronic constipation, nausea and vomiting.  She had been taking dicyclomine 3 times a day, I reduced the dose since it can exacerbate constipation.  She was eventually tried on Amitiza but called in with with complaints of chest  Tightness, headaches and palpitations after taking the medication.  She was then tried on Linzess which seemed to work well.  Patient has a longstanding history of GERD.  She stopped PPI years ago due to lack of insurance. Patient called the office  several days ago with complaints of abdominal pain.  Since she wasn't on an acid blocker , we recommended Pepcid twice daily.  She was given an appointment for today. In the interim patient went to the ED on 12/22/2018 for evaluation of chest pain and shortness of breath.  She left the ED before being seen by provider though labs were drawn and they were unremarkable. Patient says she believes that her chest tightness and shortness of breath are work related.  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    Regarding the upper abdominal pain, it started a month ago. It is located across her upper abdomen and can radiate to her back.  The pain is burning in nature. It is not aggravated or alleviated with food. It is not related to activity nor position. She feels the discomfort on a daily basis, it is almost constant.  She has associated nausea . She denies history of PUD.  No NSAID use.  Pepcid has not helped the burning but it has helped some burning that she had been having in her chest. This burning she says is different than acid reflux type burning.  She has tried dicyclomine for the epigastric burning but it hasn't helped either.  She normally takes dicyclomine for abdominal cramps related to dairy.  Patient has a history of chronic constipation.  Linzess does seem  to be working well for her.  She is having 4-5 bowel movements a week.  Data Reviewed:  ED labs 12/22/18 Basic metabolic profile normal CBC normal Pregnancy test negative   Review of systems: No unexpected weight loss. No fevers, no urinary sx   Past Medical History:  Diagnosis Date  . Benign heart murmur   . Depression   . Diabetes mellitus without complication (HCC)   . GERD (gastroesophageal reflux disease)   . Peptic ulcer   . Ulcer    stomach (no longer taking meds)    Patient's surgical history, family medical history, social history, medications and allergies were all reviewed in Epic   Serum  creatinine: 0.68 mg/dL 92/44/62 8638 Estimated creatinine clearance: 131.1 mL/min  Current Outpatient Medications  Medication Sig Dispense Refill  . acetaminophen (TYLENOL) 500 MG tablet Take 500 mg by mouth every 6 (six) hours as needed for mild pain or moderate pain.     Marland Kitchen dicyclomine (BENTYL) 20 MG tablet Take 1 tablet (20 mg total) by mouth 2 (two) times daily as needed for spasms. 60 tablet 5  . docusate sodium (COLACE) 100 MG capsule Take 1 capsule (100 mg total) by mouth 2 (two) times daily. 10 capsule 0  . linaclotide (LINZESS) 145 MCG CAPS capsule Take 1 capsule (145 mcg total) by mouth daily before breakfast. 30 capsule 11  . traMADol (ULTRAM) 50 MG tablet Take 50 mg by mouth every 6 (six) hours as needed for moderate pain.      No current facility-administered medications for this visit.     Physical Exam:     BP 122/70   Pulse 88   Ht 5\' 7"  (1.702 m)   Wt 254 lb (115.2 kg)   LMP 12/20/2018   SpO2 96%   BMI 39.78 kg/m   GENERAL:  Pleasant female in NAD PSYCH: : Cooperative, normal affect EENT:  conjunctiva pink, mucous membranes moist, neck supple without masses CARDIAC:  RRR,  no peripheral edema PULM: Normal respiratory effort, lungs CTA bilaterally, no wheezing ABDOMEN:  Nondistended, soft, nontender. No obvious masses, no hepatomegaly,  normal bowel sounds SKIN:  turgor, no lesions seen Musculoskeletal:  Normal muscle tone, normal strength NEURO: Alert and oriented x 3, no focal neurologic deficits   Willette Cluster , NP 12/28/2018, 2:59 PM

## 2018-12-28 NOTE — Patient Instructions (Addendum)
If you are age 33 or older, your body mass index should be between 23-30. Your Body mass index is 39.78 kg/m. If this is out of the aforementioned range listed, please consider follow up with your Primary Care Provider.  If you are age 53 or younger, your body mass index should be between 19-25. Your Body mass index is 39.78 kg/m. If this is out of the aformentioned range listed, please consider follow up with your Primary Care Provider.   You have been scheduled for an endoscopy. Please follow written instructions given to you at your visit today. If you use inhalers (even only as needed), please bring them with you on the day of your procedure. Your physician has requested that you go to www.startemmi.com and enter the access code given to you at your visit today. This web site gives a general overview about your procedure. However, you should still follow specific instructions given to you by our office regarding your preparation for the procedure.  STOP Pepcid.  We have sent the following medications to your pharmacy for you to pick up at your convenience: Omeprazole  Thank you for choosing me and [facility].   Willette Cluster, NP

## 2018-12-28 NOTE — Progress Notes (Signed)
Agree with assessment and plan as outlined.  

## 2019-01-04 ENCOUNTER — Ambulatory Visit (AMBULATORY_SURGERY_CENTER): Payer: BLUE CROSS/BLUE SHIELD | Admitting: Gastroenterology

## 2019-01-04 ENCOUNTER — Encounter: Payer: Self-pay | Admitting: Gastroenterology

## 2019-01-04 ENCOUNTER — Ambulatory Visit: Payer: Medicaid Other | Admitting: Family Medicine

## 2019-01-04 VITALS — BP 144/83 | HR 57 | Temp 98.9°F | Resp 11

## 2019-01-04 DIAGNOSIS — K210 Gastro-esophageal reflux disease with esophagitis: Secondary | ICD-10-CM | POA: Diagnosis present

## 2019-01-04 DIAGNOSIS — K449 Diaphragmatic hernia without obstruction or gangrene: Secondary | ICD-10-CM | POA: Diagnosis not present

## 2019-01-04 DIAGNOSIS — K2970 Gastritis, unspecified, without bleeding: Secondary | ICD-10-CM

## 2019-01-04 DIAGNOSIS — R1013 Epigastric pain: Secondary | ICD-10-CM

## 2019-01-04 DIAGNOSIS — K209 Esophagitis, unspecified without bleeding: Secondary | ICD-10-CM

## 2019-01-04 DIAGNOSIS — K2950 Unspecified chronic gastritis without bleeding: Secondary | ICD-10-CM

## 2019-01-04 DIAGNOSIS — K2990 Gastroduodenitis, unspecified, without bleeding: Secondary | ICD-10-CM

## 2019-01-04 DIAGNOSIS — K219 Gastro-esophageal reflux disease without esophagitis: Secondary | ICD-10-CM

## 2019-01-04 MED ORDER — SODIUM CHLORIDE 0.9 % IV SOLN: 500.0000 mL | Freq: Once | INTRAVENOUS | Status: DC

## 2019-01-04 MED ORDER — OMEPRAZOLE 40 MG PO CPDR: 40.0000 mg | DELAYED_RELEASE_CAPSULE | Freq: Two times a day (BID) | ORAL | 0 refills | Status: DC

## 2019-01-04 MED ORDER — SUCRALFATE 1 GM/10ML PO SUSP: 1.0000 g | Freq: Four times a day (QID) | ORAL | 0 refills | Status: DC

## 2019-01-04 NOTE — Patient Instructions (Signed)
Information on hiatal hernia and gastritis given to you today.  Await pathology results.  Trial of omeprazole increase to 40mg  twice a day.  Trial of liquid carafate 10cc every 6 hours as needed.  YOU HAD AN ENDOSCOPIC PROCEDURE TODAY AT THE [facility]:   Refer to the procedure report that was given to you for any specific questions about what was found during the examination.  If the procedure report does not answer your questions, please call your gastroenterologist to clarify.  If you requested that your care partner not be given the details of your procedure findings, then the procedure report has been included in a sealed envelope for you to review at your convenience later.  YOU SHOULD EXPECT: Some feelings of bloating in the abdomen. Passage of more gas than usual.  Walking can help get rid of the air that was put into your GI tract during the procedure and reduce the bloating. If you had a lower endoscopy (such as a colonoscopy or flexible sigmoidoscopy) you may notice spotting of blood in your stool or on the toilet paper. If you underwent a bowel prep for your procedure, you may not have a normal bowel movement for a few days.  Please Note:  You might notice some irritation and congestion in your nose or some drainage.  This is from the oxygen used during your procedure.  There is no need for concern and it should clear up in a day or so.  SYMPTOMS TO REPORT IMMEDIATELY:    Following upper endoscopy (EGD)  Vomiting of blood or coffee ground material  New chest pain or pain under the shoulder blades  Painful or persistently difficult swallowing  New shortness of breath  Fever of 100F or higher  Black, tarry-looking stools  For urgent or emergent issues, a gastroenterologist can be reached at any hour by calling [phone].   DIET:  We do recommend a small meal at first, but then you may proceed to your regular diet.  Drink plenty of fluids but you should avoid  alcoholic beverages for 24 hours.  ACTIVITY:  You should plan to take it easy for the rest of today and you should NOT DRIVE or use heavy machinery until tomorrow (because of the sedation medicines used during the test).    FOLLOW UP: Our staff will call the number listed on your records the next business day following your procedure to check on you and address any questions or concerns that you may have regarding the information given to you following your procedure. If we do not reach you, we will leave a message.  However, if you are feeling well and you are not experiencing any problems, there is no need to return our call.  We will assume that you have returned to your regular daily activities without incident.  If any biopsies were taken you will be contacted by phone or by letter within the next 1-3 weeks.  Please call us at [phone] if you have not heard about the biopsies in 3 weeks.    SIGNATURES/CONFIDENTIALITY: You and/or your care partner have signed paperwork which will be entered into your electronic medical record.  These signatures attest to the fact that that the information above on your After Visit Summary has been reviewed and is understood.  Full responsibility of the confidentiality of this discharge information lies with you and/or your care-partner.

## 2019-01-04 NOTE — Progress Notes (Signed)
Pt's states no medical or surgical changes since previsit or office visit. 

## 2019-01-04 NOTE — Op Note (Signed)
[facility] Patient Name: Tanya Terry Procedure Date: 01/04/2019 8:36 AM MRN: [id number] Endoscopist: Viviann Spare P. Adela Lank , MD Age: 33 Referring MD:  Date of Birth: 04-22-1986 Gender: Female Account #: [id number] Procedure:                Upper GI endoscopy Indications:              Epigastric abdominal pain, Follow-up of                            gastro-esophageal reflux disease - persistent                            pyrosis / chest symptoms despite omeprazole 40mg                             once daily Medicines:                Monitored Anesthesia Care Procedure:                Pre-Anesthesia Assessment:                           - Prior to the procedure, a History and Physical                            was performed, and patient medications and                            allergies were reviewed. The patient's tolerance of                            previous anesthesia was also reviewed. The risks                            and benefits of the procedure and the sedation                            options and risks were discussed with the patient.                            All questions were answered, and informed consent                            was obtained. Prior Anticoagulants: The patient has                            taken no previous anticoagulant or antiplatelet                            agents. ASA Grade Assessment: II - A patient with                            mild systemic disease. After reviewing the risks  and benefits, the patient was deemed in                            satisfactory condition to undergo the procedure.                           After obtaining informed consent, the endoscope was                            passed under direct vision. Throughout the                            procedure, the patient's blood pressure, pulse, and                            oxygen saturations were monitored continuously. The                            Endoscope was introduced through the mouth, and                            advanced to the second part of duodenum. The upper                            GI endoscopy was accomplished without difficulty.                            The patient tolerated the procedure well. Scope In: Scope Out: Findings:                 Esophagogastric landmarks were identified: the                            Z-line was found at 36 cm, the gastroesophageal                            junction was found at 36 cm and the upper extent of                            the gastric folds was found at 38 cm from the                            incisors.                           A 2 cm sliding hiatal hernia was present.                           LA Grade A esophagitis was found at the GEJ.                           The exam of the esophagus was otherwise normal.  Biopsies were taken with a cold forceps in the                            upper third of the esophagus, in the middle third                            of the esophagus and in the lower third of the                            esophagus for histology, rule out EoE.                           Diffuse mild inflammation characterized by erythema                            was found in the gastric antrum. Biopsies were                            taken with a cold forceps for Helicobacter pylori                            testing.                           The exam of the stomach was otherwise normal.                           The duodenal bulb and second portion of the                            duodenum were normal. Complications:            No immediate complications. Estimated blood loss:                            Minimal. Estimated Blood Loss:     Estimated blood loss was minimal. Impression:               - Esophagogastric landmarks identified.                           - 2 cm hiatal hernia.                            - LA Grade A reflux esophagitis.                           - Normal esophagus otherwise, biopsies taken to                            rule out EoE                           - Mild Gastritis. Biopsied.                           -  Normal duodenal bulb and second portion of the                            duodenum. Recommendation:           - Patient has a contact number available for                            emergencies. The signs and symptoms of potential                            delayed complications were discussed with the                            patient. Return to normal activities tomorrow.                            Written discharge instructions were provided to the                            patient.                           - Resume previous diet.                           - Continue present medications.                           - Trial of increasing omeprazole to  twice daily                           - Trial of liquid carafate 10cc every 6 hours as                            needed                           - Await pathology results. Viviann Spare P. Adela Lank, MD 01/04/2019 9:03:47 AM This report has been signed electronically.

## 2019-01-04 NOTE — Progress Notes (Signed)
Called to room to assist during endoscopic procedure.  Patient ID and intended procedure confirmed with present staff. Received instructions for my participation in the procedure from the performing physician.  

## 2019-01-04 NOTE — Progress Notes (Signed)
Pt experienced laryngeal spasm with jaw thrust performed with sats returning to >92%.   Report given to PACU, vss

## 2019-01-05 ENCOUNTER — Telehealth: Payer: Self-pay

## 2019-01-05 NOTE — Telephone Encounter (Signed)
Thanks for letting me know. The patient had an EGD yesterday without any high risk interventions. She had some soreness in her jaw after the procedure due to jaw lift by anesthesia. Can you please clarify if she is tolerating PO, no fevers, etc. I would encourage her to take her medications, I think she will feel better with time. Of note I am out of the office today and will have only intermittent access to Epic today. If anything urge ntly needs to be addressed for this patient please contact the doc of the day

## 2019-01-05 NOTE — Telephone Encounter (Signed)
  Follow up Call-  Call back number 01/04/2019  Post procedure Call Back phone  # [phone]  Permission to leave phone message Yes  Some recent data might be hidden     Patient questions:  Do you have a fever, pain , or abdominal swelling? Yes.   Pain Score  10 *  Have you tolerated food without any problems? No.  Have you been able to return to your normal activities? No.  Do you have any questions about your discharge instructions: Diet   No. Medications  Yes.   Follow up visit  No.  Do you have questions or concerns about your Care? Yes.    Actions: * If pain score is 4 or above: Physician/ provider Notified : Willaim Rayas. Adela Lank, MD   FEELS LIKE SOMETHING IS SITTING ON HER CHEST.  HAS NOT FILLED OR TAKEN ANY MEDS PRESCRIBED YESTERDAY.  HAS NOT EATEN  CAME HOME YESTERDAY AND DID SLEEP UNINTERRUPTED  RN ADVISED PT TO FILL MEDS AND BEGIN TAKING AND CALL us AGAIN BEFORE 4PM TODAY IF NO IMPROVEMENT  WILL FORWARD THIS NOTE TO MD.

## 2019-01-05 NOTE — Telephone Encounter (Signed)
Spoke with patient and encouraged her to take the carafate and omeprazole.  Explained to her that Dr. Adela Lank feels that this will help with her pain.  Also advised her to try Tylenol or Motrin.  She is worried that she will not be able to go to work tomorrow.  I instructed her to let us know Monday if she is not able to go.  Also instructed her to call back with any worsening symptoms, vomiting or difficulty breathing.  Pt. Verbalized understanding.

## 2019-01-29 ENCOUNTER — Telehealth (INDEPENDENT_AMBULATORY_CARE_PROVIDER_SITE_OTHER): Payer: BLUE CROSS/BLUE SHIELD | Admitting: Gastroenterology

## 2019-01-29 DIAGNOSIS — R112 Nausea with vomiting, unspecified: Secondary | ICD-10-CM | POA: Diagnosis not present

## 2019-01-29 NOTE — Telephone Encounter (Signed)
I called the patient and left a message, no answer, will call back

## 2019-01-29 NOTE — Telephone Encounter (Signed)
Pt calling stating she is still having issues with burning discomfort in her stomach. Also reports she is still constipated, not having regular BM's. States she is not able to eat much, only snacks. When she tries to eat a meal she feels very full and bloated. Pt is taking bentyl but reports it makes the constipation worse. Please advise.

## 2019-01-31 ENCOUNTER — Other Ambulatory Visit: Payer: Self-pay

## 2019-01-31 ENCOUNTER — Encounter: Payer: Self-pay | Admitting: Gastroenterology

## 2019-01-31 DIAGNOSIS — R1010 Upper abdominal pain, unspecified: Secondary | ICD-10-CM

## 2019-01-31 DIAGNOSIS — R1030 Lower abdominal pain, unspecified: Secondary | ICD-10-CM

## 2019-01-31 MED ORDER — ONDANSETRON 4 MG PO TBDP: 4.0000 mg | ORAL_TABLET | Freq: Three times a day (TID) | ORAL | 3 refills | Status: DC | PRN

## 2019-01-31 MED ORDER — METOCLOPRAMIDE HCL 10 MG PO TABS: 10.0000 mg | ORAL_TABLET | Freq: Three times a day (TID) | ORAL | 0 refills | Status: DC

## 2019-01-31 NOTE — Telephone Encounter (Signed)
Left message for pt to call back. Pt scheduled for CT of A/P at [place] [facility] 02/01/19@10 :30am. Pt to be NPO after midnight. Pt to drink bottle 1 of contrast at 8:30am and bottle 2 at 9:30am.Left message for pt to call back.

## 2019-01-31 NOTE — Telephone Encounter (Signed)
Virtual Visit via Telemedicine Note  I connected with Tanya Terry on @TODAY @ at  by telemedicine and verified that I am speaking with the correct person using two identifiers.   I discussed the limitations, risks, security and privacy concerns of performing an evaluation and management service by telephone and the availability of in person appointments. I also discussed with the patient that there may be a patient responsible charge related to this service. The patient expressed understanding and agreed to proceed.  THIS ENCOUNTER IS A VIRTUAL VISIT DUE TO COVID-19 - PATIENT WAS NOT SEEN IN THE OFFICE. PATIENT HAS CONSENTED TO VIRTUAL VISIT / TELEMEDICINE VISIT   Location of patient: home Location of provider: office  Time spent on call:  12 minutes     HPI :  33 y/o female has been seen in the past few months by Tanya Terry for constipation, abdominal pain, nausea, reflux, early satiety. She had an EGD with me on 2/27 which did not show anything concerning. Biopsies negative for HP and EoE. Placed on high dose PPI and carafate. Reflux okay but continues to have chronic nausea, early satiety, having difficulty eating. She is not vomiting. She is also having severe cramping abdominal pain from her upper abdomen through to her lower abdomen. Taking bentyl which is not helping. She has a hard time localizing the pain. She had been given Linzess for constipation . This does help the constipation but causes loose stools with urgency and incontinence and she does not like how it makes her feel. She has not been using it much due to these symptoms but now having constipation.   She is having more frequent severe symptoms lately, has missed multiple days of work, feels miserable.  Past Medical History:  Diagnosis Date  . Benign heart murmur   . Constipation   . Depression   . Diabetes mellitus without complication (HCC)   . GERD (gastroesophageal reflux disease)   . Peptic ulcer   .  Ulcer      Past Surgical History:  Procedure Laterality Date  . MYRINGOTOMY     with tubes  . UPPER GASTROINTESTINAL ENDOSCOPY    . WISDOM TOOTH EXTRACTION     Family History  Problem Relation Age of Onset  . Diabetes Mother   . Hypertension Mother   . Heart disease Father   . Hypertension Father   . Anesthesia problems Neg Hx   . Hypotension Neg Hx   . Malignant hyperthermia Neg Hx   . Pseudochol deficiency Neg Hx    Social History   Tobacco Use  . Smoking status: Former Smoker    Types: Cigarettes    Last attempt to quit: 10/04/2012    Years since quitting: 6.3  . Smokeless tobacco: Never Used  Substance Use Topics  . Alcohol use: Not Currently  . Drug use: Yes    Types: Marijuana   Current Outpatient Medications  Medication Sig Dispense Refill  . acetaminophen (TYLENOL) 500 MG tablet Take 500 mg by mouth every 6 (six) hours as needed for mild pain or moderate pain.     Marland Kitchen dicyclomine (BENTYL) 20 MG tablet Take 1 tablet (20 mg total) by mouth 2 (two) times daily as needed for spasms. 60 tablet 5  . docusate sodium (COLACE) 100 MG capsule Take 1 capsule (100 mg total) by mouth 2 (two) times daily. 10 capsule 0  . linaclotide (LINZESS) 145 MCG CAPS capsule Take 1 capsule (145 mcg total) by mouth daily before breakfast.  30 capsule 11  . omeprazole (PRILOSEC) 40 MG capsule Take 1 capsule (40 mg total) by mouth 2 (two) times daily. 180 capsule 0  . sucralfate (CARAFATE) 1 GM/10ML suspension Take 10 mLs (1 g total) by mouth 4 (four) times daily. 420 mL 0  . traMADol (ULTRAM) 50 MG tablet Take 50 mg by mouth every 6 (six) hours as needed for moderate pain.      No current facility-administered medications for this visit.    Allergies  Allergen Reactions  . Augmentin [Amoxicillin-Pot Clavulanate] Anaphylaxis, Shortness Of Breath and Swelling    Has patient had a PCN reaction causing immediate rash, facial/tongue/throat swelling, SOB or lightheadedness with hypotension: Yes  Has patient had a PCN reaction causing severe rash involving mucus membranes or skin necrosis: Yes Has patient had a PCN reaction that required hospitalization No Has patient had a PCN reaction occurring within the last 10 years: Yes If all of the above answers are "NO", then may proceed with Cephalosporin use.   . Sulfa Antibiotics Anaphylaxis, Shortness Of Breath and Swelling     Review of Systems: All systems reviewed and negative except where noted in HPI.   Lab Results  Component Value Date   WBC 10.5 12/22/2018   HGB 13.9 12/22/2018   HCT 41.8 12/22/2018   MCV 92.5 12/22/2018   PLT 302 12/22/2018    Lab Results  Component Value Date   CREATININE 0.68 12/22/2018   BUN 8 12/22/2018   NA 140 12/22/2018   K 3.8 12/22/2018   CL 109 12/22/2018   CO2 25 12/22/2018    Lab Results  Component Value Date   ALT 13 (L) 03/14/2017   AST 17 03/14/2017   ALKPHOS 126 03/14/2017   BILITOT 0.6 03/14/2017     Physical Exam: NA  ASSESSMENT AND PLAN:  33 y/o female with worsening nausea, early satiety, abdominal pain, constipation:  EGD unremarkable and labs reassuring but her symptoms have persisted over time, now missing work due to symptoms. She should have dyspepsia / gastroparesis causing her upper tract symptoms, but would be unusual for this to cause her lower pain. Linzess too strong for her so she stopped it, but having a bowel movement has not relieved her symptoms.  Recommend the following: - stop Linzess, will go to Miralax one dose BID and titrate up as needed for constipation - trial of Zofran 4mg  ODT q 8 hrs for nausea - trial of low dose Reglan to see if that helps 10mg  TID to start. Will give 2 weeks supply, if no benefit will stop. I counseled her on neurologic risks, she wanted to proceed. - continue PPI - given worsening of persistent pain, which is hard to localize - both upper and lower abdomen, recommend cross sectional imaging at this point with CT  abdomen / pelvis with contrast.    [name], I will order her medications. Can you please call her to schedule CT abdomen / pelvis with contrast.  She also may need a note for work if she continues to miss it due to symptoms.   Thanks

## 2019-01-31 NOTE — Telephone Encounter (Signed)
Spoke with pt and she is aware, contrast and instructions up front for pickup.

## 2019-02-01 ENCOUNTER — Telehealth: Payer: Self-pay | Admitting: Gastroenterology

## 2019-02-01 ENCOUNTER — Inpatient Hospital Stay: Admission: RE | Admit: 2019-02-01 | Payer: BLUE CROSS/BLUE SHIELD | Source: Ambulatory Visit

## 2019-02-01 NOTE — Telephone Encounter (Signed)
Peer to peer has been scheduled for 02/02/2019 at 1:30pm a Dr.Gary Su Hilt will call [phone]. Case # [id number]

## 2019-02-01 NOTE — Telephone Encounter (Signed)
Dr. Adela Lank please see the note below from [facility], she has set up a peer to peer for you.

## 2019-02-01 NOTE — Telephone Encounter (Signed)
This does not make sense to me. They want to spend extra money on an [place] which I don't think will likely yield a diagnosis, and then spend additional money on the CT scan. I'm not sure if there is any way I can appeal this or do a peer to peer. If not, we can order complete abdominal US. Thanks

## 2019-02-01 NOTE — Telephone Encounter (Signed)
CT has been denied, see note below. States pt needs to have an abd [place] first. Please advise.

## 2019-02-01 NOTE — Telephone Encounter (Signed)
Patient's CT Abd pelvis has been denied by Medicaid:  Based on [employer]dom[employer] Guidelines Section: AB 2.2 Abdominal Pain, we cannot approve this request. Your records show that you have abdominal pain. The reason this request cannot be approved is because: 1. Guidelines support a recent ultrasound for the clinical indication(s) presented. Ultrasound may help confirm the diagnosis or may help determine the most appropriate next imaging test. The requested procedure might be indicated when recent ultrasounds have been performed that are technically limited or non-diagnostic. The clinical information provided does not describe these results and, therefore, the request is not indicated at this time.  Please advise as to next step.  Thank you

## 2019-02-01 NOTE — Telephone Encounter (Signed)
Is a peer to peer an option for Dr. Adela Lank?

## 2019-02-02 NOTE — Telephone Encounter (Signed)
Peer to peer has been scheduled for 02/02/2019 at 2:00pm a Dr.Jeniffer Cyril Mourning will be calling [phone].  Case # [id number].

## 2019-02-02 NOTE — Telephone Encounter (Signed)
Erie Noe please see note below from Dr. Adela Lank. Can you help with his request?

## 2019-02-02 NOTE — Telephone Encounter (Signed)
Thanks [name], but unfortunately that time does not work for me, I have virtual office visits starting at 130. I could do 1 PM or 115 PM if they can do that. Thanks

## 2019-02-02 NOTE — Telephone Encounter (Signed)
Patient had already been scheduled for CT on 02/05/2019 patient is aware of appointment.

## 2019-02-02 NOTE — Telephone Encounter (Signed)
Tanya Terry I had a peer to peer and the CT scan got approved.  Approval # is [id number]  Can you help schedule? Thanks

## 2019-02-05 ENCOUNTER — Encounter (INDEPENDENT_AMBULATORY_CARE_PROVIDER_SITE_OTHER): Payer: Self-pay

## 2019-02-05 ENCOUNTER — Ambulatory Visit (INDEPENDENT_AMBULATORY_CARE_PROVIDER_SITE_OTHER)
Admission: RE | Admit: 2019-02-05 | Discharge: 2019-02-05 | Disposition: A | Discharge status: Home / Self Care | Payer: BLUE CROSS/BLUE SHIELD | Source: Ambulatory Visit | Attending: Gastroenterology | Admitting: Gastroenterology

## 2019-02-05 ENCOUNTER — Other Ambulatory Visit: Payer: Self-pay

## 2019-02-05 DIAGNOSIS — R1010 Upper abdominal pain, unspecified: Secondary | ICD-10-CM

## 2019-02-05 DIAGNOSIS — R1030 Lower abdominal pain, unspecified: Secondary | ICD-10-CM | POA: Diagnosis not present

## 2019-02-05 MED ORDER — IOHEXOL 300 MG/ML  SOLN
100.0000 mL | Freq: Once | INTRAMUSCULAR | Status: AC | PRN
  Administered 2019-02-05: 100 mL via INTRAVENOUS

## 2019-02-20 ENCOUNTER — Encounter: Payer: Self-pay | Admitting: Gastroenterology

## 2019-02-20 ENCOUNTER — Ambulatory Visit (INDEPENDENT_AMBULATORY_CARE_PROVIDER_SITE_OTHER): Payer: BLUE CROSS/BLUE SHIELD | Admitting: Gastroenterology

## 2019-02-20 ENCOUNTER — Other Ambulatory Visit: Payer: Self-pay

## 2019-02-20 VITALS — Ht 67.0 in | Wt 223.0 lb

## 2019-02-20 DIAGNOSIS — K5900 Constipation, unspecified: Secondary | ICD-10-CM | POA: Diagnosis not present

## 2019-02-20 DIAGNOSIS — R110 Nausea: Secondary | ICD-10-CM | POA: Diagnosis not present

## 2019-02-20 DIAGNOSIS — R109 Unspecified abdominal pain: Secondary | ICD-10-CM | POA: Diagnosis not present

## 2019-02-20 DIAGNOSIS — K219 Gastro-esophageal reflux disease without esophagitis: Secondary | ICD-10-CM

## 2019-02-20 MED ORDER — DESIPRAMINE HCL 25 MG PO TABS: 25.0000 mg | ORAL_TABLET | Freq: Every day | ORAL | 3 refills | Status: DC

## 2019-02-20 MED ORDER — ONDANSETRON 4 MG PO TBDP: 4.0000 mg | ORAL_TABLET | Freq: Three times a day (TID) | ORAL | 2 refills | Status: DC | PRN

## 2019-02-20 MED ORDER — OMEPRAZOLE 40 MG PO CPDR: 40.0000 mg | DELAYED_RELEASE_CAPSULE | Freq: Two times a day (BID) | ORAL | 1 refills | Status: DC

## 2019-02-20 NOTE — Patient Instructions (Addendum)
If you are age 33 or older, your body mass index should be between 23-30. Your Body mass index is 34.93 kg/m. If this is out of the aforementioned range listed, please consider follow up with your Primary Care Provider.  If you are age 78 or younger, your body mass index should be between 19-25. Your Body mass index is 34.93 kg/m. If this is out of the aformentioned range listed, please consider follow up with your Primary Care Provider.   To help prevent the possible spread of infection to our patients, communities, and staff; we will be implementing the following measures:  As of now we are not allowing any visitors/family members to accompany you to any upcoming appointments with [facility]. If you have any concerns about this please contact our office to discuss prior to the appointment.   You have been scheduled for an abdominal ultrasound at [facility] located at [address] on Thursday, 4-16 at 9:00am. Please arrive 20 minutes prior to your appointment for registration. Make certain not to have anything to eat or drink after midnight prior to your appointment. Should you need to reschedule your appointment, please contact [facility] at [phone]. This test typically takes about 30 minutes to perform.   Please go to the lab in the basement of our building to have lab work done as you leave today. Hit "B" for basement when you get on the elevator.  When the doors open the lab is on your left.  We will call you with the results. Thank you.   We have sent the following medications to your pharmacy for you to pick up at your convenience: Omeprazole 40mg : Take twice a day  Zofran 4mg : Take every 8 hours as needed   Desipramine 25mg : Take 1 tablet every night at bedtime.  After 2 weeks you can increase to 2 tablets at bedtime.   Thank you for entrusting me with your care and for choosing [facility], Dr. Ileene Patrick

## 2019-02-20 NOTE — Progress Notes (Signed)
Virtual Visit via Video Note  I connected with Tanya Terry on 02/20/19 at  9:00 AM EDT by a video enabled telemedicine application and verified that I am speaking with the correct person using two identifiers.   I discussed the limitations of evaluation and management by telemedicine and the availability of in person appointments. The patient expressed understanding and agreed to proceed.  THIS ENCOUNTER IS A VIRTUAL VISIT DUE TO COVID-19 - PATIENT WAS NOT SEEN IN THE OFFICE. PATIENT HAS CONSENTED TO VIRTUAL VISIT / TELEMEDICINE VISIT   Location of patient: work Location of provider: office Persons participating: myself, patient  HPI: 33 y/o female here for a follow up visit. She has been evaluated for abdominal pain, reflux, nausea, constipation. She has had an EGD as outlined below and CT scan on 01/3019 to further evaluate. Remarkable findings was mild esophagitis and hiatal hernia, otherwise no clear cause for symptoms. CT scan was negative. Omeprazole  BID has resolved her reflux, Zofran has helped with nausea, doing better in that regards.  Regarding constipation, linzess caused diarrhea and made her feel bad. We did a trial of Miralax which did not work as well. Now on Linzess which allows her to have a BM every other day to every 2 days. Overall this regimen has helped to stabilize her bowel function.    Her pain is the main issue, this is preventing her for working. She is tearful during interview today. She has pain in the epigastric area, which can radiated into her "sided" and into her lower abdomen. At times can feel it in RUQ but mostly epigastric area. She thinks eating can make it worse, as well as worse when she has the urge to have a bowel movement. Having a bowel movement does not help relieve her pain however.  She is having a BM once every other day or every 2 days. She feels worse if she takes the Linzess more frequently than / day. Pain can  sometimes go into her RUQ and shoulders. Zofran working for the nausea and allows her to function. She had tried some Reglan and that did not help her postprandial symptoms. Bentyl did not help her cramps / pain.  CT scan done on 02/05/19 - normal  EGD 01/04/19 - - Esophagogastric landmarks identified. - 2 cm hiatal hernia. - LA Grade A reflux esophagitis. - Normal esophagus otherwise, biopsies taken to rule out EoE, Negative - Mild Gastritis. Biopsied. Negative for HP - Normal duodenal bulb and second portion of the duodenum.  Past Medical History:  Diagnosis Date   Benign heart murmur    Constipation    Depression    Diabetes mellitus without complication (HCC)    GERD (gastroesophageal reflux disease)    Peptic ulcer    Ulcer      Past Surgical History:  Procedure Laterality Date   MYRINGOTOMY     with tubes   UPPER GASTROINTESTINAL ENDOSCOPY     WISDOM TOOTH EXTRACTION     Family History  Problem Relation Age of Onset   Diabetes Mother    Hypertension Mother    Heart disease Father    Hypertension Father    Anesthesia problems Neg Hx    Hypotension Neg Hx    Malignant hyperthermia Neg Hx    Pseudochol deficiency Neg Hx    Social History   Tobacco Use   Smoking status: Former Smoker    Types: Cigarettes    Last attempt to  quit: 10/04/2012    Years since quitting: 6.3   Smokeless tobacco: Never Used  Substance Use Topics   Alcohol use: Not Currently   Drug use: Yes    Types: Marijuana   Current Outpatient Medications  Medication Sig Dispense Refill   acetaminophen (TYLENOL) 500 MG tablet Take 500 mg by mouth every 6 (six) hours as needed for mild pain or moderate pain.      dicyclomine (BENTYL) 20 MG tablet Take 1 tablet (20 mg total) by mouth 2 (two) times daily as needed for spasms. 60 tablet 5   docusate sodium (COLACE) 100 MG capsule Take 1 capsule (100 mg total) by mouth 2 (two) times daily. 10 capsule 0   linaclotide  (LINZESS) 145 MCG CAPS capsule Take 1 capsule (145 mcg total) by mouth daily before breakfast. 30 capsule 11   metoCLOPramide (REGLAN) 10 MG tablet Take 1 tablet (10 mg total) by mouth 3 (three) times daily before meals. 42 tablet 0   omeprazole (PRILOSEC) 40 MG capsule Take 1 capsule (40 mg total) by mouth 2 (two) times daily. 180 capsule 0   ondansetron (ZOFRAN-ODT) 4 MG disintegrating tablet Take 1 tablet (4 mg total) by mouth every 8 (eight) hours as needed for nausea or vomiting. 30 tablet 3   sucralfate (CARAFATE) 1 GM/10ML suspension Take 10 mLs (1 g total) by mouth 4 (four) times daily. 420 mL 0   traMADol (ULTRAM) 50 MG tablet Take 50 mg by mouth every 6 (six) hours as needed for moderate pain.      No current facility-administered medications for this visit.    Allergies  Allergen Reactions   Augmentin [Amoxicillin-Pot Clavulanate] Anaphylaxis, Shortness Of Breath and Swelling    Has patient had a PCN reaction causing immediate rash, facial/tongue/throat swelling, SOB or lightheadedness with hypotension: Yes Has patient had a PCN reaction causing severe rash involving mucus membranes or skin necrosis: Yes Has patient had a PCN reaction that required hospitalization No Has patient had a PCN reaction occurring within the last 10 years: Yes If all of the above answers are "NO", then may proceed with Cephalosporin use.    Sulfa Antibiotics Anaphylaxis, Shortness Of Breath and Swelling     Review of Systems: All systems reviewed and negative except where noted in HPI.    Ct Abdomen Pelvis W Contrast  Result Date: 02/05/2019 CLINICAL DATA:  Recurrent abdominal pain, diarrhea EXAM: CT ABDOMEN AND PELVIS WITH CONTRAST TECHNIQUE: Multidetector CT imaging of the abdomen and pelvis was performed using the standard protocol following bolus administration of intravenous contrast. CONTRAST:  OMNIPAQUE IOHEXOL 300 MG/ML  SOLN COMPARISON:  None. FINDINGS: Lower chest: Lung bases  are clear. Hepatobiliary: 5 mm cyst inferiorly in the right hepatic lobe (coronal image 50). Liver is otherwise within normal limits. Gallbladder is unremarkable. No intrahepatic or extrahepatic ductal dilatation. Pancreas: Within normal limits. Spleen: Within normal limits. Adrenals/Urinary Tract: Adrenal glands are within normal limits. Kidneys are within normal limits.  No hydronephrosis. Bladder is within normal limits. Stomach/Bowel: Stomach is within normal limits. No evidence of bowel obstruction. Normal appendix (series 2/image 63). No colonic wall thickening or inflammatory changes. Vascular/Lymphatic: No evidence of abdominal aortic aneurysm. Circumaortic left renal vein. No suspicious abdominopelvic lymphadenopathy. Reproductive: Mildly heterogeneous uterus. Bilateral ovaries are within normal limits. Other: No abdominopelvic ascites. Musculoskeletal: Visualized osseous structures are within normal limits. IMPRESSION: Negative CT abdomen/pelvis. Electronically Signed   By: Charline Bills M.D.   On: 02/05/2019 11:45   Lab Results  Component Value Date   WBC 10.5 12/22/2018   HGB 13.9 12/22/2018   HCT 41.8 12/22/2018   MCV 92.5 12/22/2018   PLT 302 12/22/2018    Lab Results  Component Value Date   CREATININE 0.68 12/22/2018   BUN 8 12/22/2018   NA 140 12/22/2018   K 3.8 12/22/2018   CL 109 12/22/2018   CO2 25 12/22/2018    Lab Results  Component Value Date   ALT 13 (L) 03/14/2017   AST 17 03/14/2017   ALKPHOS 126 03/14/2017   BILITOT 0.6 03/14/2017      Physical Exam: NA  ASSESSMENT AND PLAN:  33 y/o female has been seen in the past few months by Willette ClusterPaula Guenther for abdominal pain, nausea, reflux, constipation, early satiety. She had an EGD with me on 2/27 which did not show anything concerning (H pylori and EoE negative) and since then has had a CT scan of the abdomen / pelvis which was normal. Her reflux and nausea are now controlled, her constipation appears  adequately treated with Linzess, but the main issue is her pain from the epigastric area / mid abdomen / lower abdomen, occurs sporadically with worsening with both PO intake and urge to have a bowel movement. Trial of Reglan and bentyl has not helped. I suspect at this time she may have having severe IBS / functional symptoms causing this, although I think given her upper pain her gallbladder should be evaluated to ensure normal as well. She has not been able to work reliably due to symptoms, tearful during interview. Had a lengthy discussion about her symptoms, I do not see anything bad on her imaging and workup thus far which is reassuring but will continue to evaluate and treat her to make her feel better and will hopefully sort this out with more time.   Recommend the following: - RUQ US, rule out gallstones. Her entire presentation would be unusual to be caused solely by gallstones, but if present, could cause upper tract pain - will check CBC, lipase, CMET to ensure stable, no changes - continue Linzess 145mcg / day to treat constipation - continue omeprazole 40mg  BID which has controlled reflux - continue zofran for nausea - discussed option of TCA with her to treat her pain - recommend trial of Desipramine in order to minimize risk of worsening constipation. Discussed is effect profile. Will start 25mg  q HS for 2 weeks, and increase to 50mg  q HS if she tolerates it.  - will write letter for her employer given the amount of work she has missed thus far due to pain - if symptoms persist despite change in regimen, may consider colonoscopy  She agreed with the plan, all questions answered.  Ileene PatrickSteven Naresh Althaus, MD [facility]

## 2019-02-21 ENCOUNTER — Other Ambulatory Visit (INDEPENDENT_AMBULATORY_CARE_PROVIDER_SITE_OTHER): Payer: BLUE CROSS/BLUE SHIELD

## 2019-02-21 DIAGNOSIS — K5900 Constipation, unspecified: Secondary | ICD-10-CM

## 2019-02-21 DIAGNOSIS — R109 Unspecified abdominal pain: Secondary | ICD-10-CM | POA: Diagnosis not present

## 2019-02-21 DIAGNOSIS — K219 Gastro-esophageal reflux disease without esophagitis: Secondary | ICD-10-CM | POA: Diagnosis not present

## 2019-02-21 LAB — CBC WITH DIFFERENTIAL/PLATELET
Basophils Absolute: 0.1 10*3/uL (ref 0.0–0.1)
Basophils Relative: 0.6 % (ref 0.0–3.0)
Eosinophils Absolute: 0.1 10*3/uL (ref 0.0–0.7)
Eosinophils Relative: 0.9 % (ref 0.0–5.0)
HCT: 43.1 % (ref 36.0–46.0)
Hemoglobin: 14.9 g/dL (ref 12.0–15.0)
Lymphocytes Relative: 25.9 % (ref 12.0–46.0)
Lymphs Abs: 3.2 10*3/uL (ref 0.7–4.0)
MCHC: 34.5 g/dL (ref 30.0–36.0)
MCV: 91.4 fl (ref 78.0–100.0)
Monocytes Absolute: 0.7 10*3/uL (ref 0.1–1.0)
Monocytes Relative: 5.9 % (ref 3.0–12.0)
Neutro Abs: 8.3 10*3/uL — ABNORMAL HIGH (ref 1.4–7.7)
Neutrophils Relative %: 66.7 % (ref 43.0–77.0)
Platelets: 300 10*3/uL (ref 150.0–400.0)
RBC: 4.71 Mil/uL (ref 3.87–5.11)
RDW: 12.6 % (ref 11.5–15.5)
WBC: 12.4 10*3/uL — ABNORMAL HIGH (ref 4.0–10.5)

## 2019-02-21 LAB — COMPREHENSIVE METABOLIC PANEL
ALT: 18 U/L (ref 0–35)
AST: 12 U/L (ref 0–37)
Albumin: 4.4 g/dL (ref 3.5–5.2)
Alkaline Phosphatase: 66 U/L (ref 39–117)
BUN: 11 mg/dL (ref 6–23)
CO2: 27 mEq/L (ref 19–32)
Calcium: 9.4 mg/dL (ref 8.4–10.5)
Chloride: 104 mEq/L (ref 96–112)
Creatinine, Ser: 0.82 mg/dL (ref 0.40–1.20)
GFR: 97 mL/min (ref >60.00)
Glucose, Bld: 82 mg/dL (ref 70–99)
Potassium: 4.2 mEq/L (ref 3.5–5.1)
Sodium: 139 mEq/L (ref 135–145)
Total Bilirubin: 0.4 mg/dL (ref 0.2–1.2)
Total Protein: 6.9 g/dL (ref 6.0–8.3)

## 2019-02-21 LAB — LIPASE: Lipase: 22 U/L (ref 11.0–59.0)

## 2019-02-22 ENCOUNTER — Ambulatory Visit
Admission: RE | Admit: 2019-02-22 | Discharge: 2019-02-22 | Disposition: A | Discharge status: Home / Self Care | Payer: BLUE CROSS/BLUE SHIELD | Source: Ambulatory Visit | Attending: Gastroenterology | Admitting: Gastroenterology

## 2019-02-22 ENCOUNTER — Other Ambulatory Visit: Payer: Self-pay

## 2019-02-22 DIAGNOSIS — K219 Gastro-esophageal reflux disease without esophagitis: Secondary | ICD-10-CM

## 2019-02-22 DIAGNOSIS — D72829 Elevated white blood cell count, unspecified: Secondary | ICD-10-CM

## 2019-02-22 DIAGNOSIS — K5900 Constipation, unspecified: Secondary | ICD-10-CM

## 2019-02-22 DIAGNOSIS — R109 Unspecified abdominal pain: Secondary | ICD-10-CM

## 2019-02-22 NOTE — Progress Notes (Signed)
Cbc due in 2 weeks for elevated WBC

## 2019-03-07 ENCOUNTER — Telehealth: Payer: Self-pay

## 2019-03-07 NOTE — Telephone Encounter (Signed)
Called pt and reminded her to go to lab.  Open Monday thru Friday 8 - 4pm.

## 2019-03-07 NOTE — Telephone Encounter (Signed)
-----   Message from Cooper Render, CMA sent at 02/22/2019 10:45 AM EDT ----- Regarding: cbc due for elevated WBC Remind pt to go to lab for cbc this week or next

## 2019-03-12 ENCOUNTER — Other Ambulatory Visit (INDEPENDENT_AMBULATORY_CARE_PROVIDER_SITE_OTHER): Payer: BLUE CROSS/BLUE SHIELD

## 2019-03-12 DIAGNOSIS — D72829 Elevated white blood cell count, unspecified: Secondary | ICD-10-CM | POA: Diagnosis not present

## 2019-03-12 LAB — CBC WITH DIFFERENTIAL/PLATELET
Basophils Absolute: 0.1 10*3/uL (ref 0.0–0.1)
Basophils Relative: 0.8 % (ref 0.0–3.0)
Eosinophils Absolute: 0.1 10*3/uL (ref 0.0–0.7)
Eosinophils Relative: 0.8 % (ref 0.0–5.0)
HCT: 40.4 % (ref 36.0–46.0)
Hemoglobin: 14 g/dL (ref 12.0–15.0)
Lymphocytes Relative: 27.3 % (ref 12.0–46.0)
Lymphs Abs: 3.1 10*3/uL (ref 0.7–4.0)
MCHC: 34.7 g/dL (ref 30.0–36.0)
MCV: 91.1 fl (ref 78.0–100.0)
Monocytes Absolute: 0.7 10*3/uL (ref 0.1–1.0)
Monocytes Relative: 6.2 % (ref 3.0–12.0)
Neutro Abs: 7.4 10*3/uL (ref 1.4–7.7)
Neutrophils Relative %: 64.9 % (ref 43.0–77.0)
Platelets: 285 10*3/uL (ref 150.0–400.0)
RBC: 4.43 Mil/uL (ref 3.87–5.11)
RDW: 12.7 % (ref 11.5–15.5)
WBC: 11.5 10*3/uL — ABNORMAL HIGH (ref 4.0–10.5)

## 2019-03-13 ENCOUNTER — Other Ambulatory Visit: Payer: Self-pay

## 2019-03-13 ENCOUNTER — Telehealth: Payer: Self-pay | Admitting: Gastroenterology

## 2019-03-13 DIAGNOSIS — D72829 Elevated white blood cell count, unspecified: Secondary | ICD-10-CM

## 2019-03-13 NOTE — Progress Notes (Signed)
Patient called back, gave her lab results and Dr. Lanetta Inch suggestion to follow-up with her PCP ref.chronic/slight elevation of WBC. Patient feeling just a little bit better since on desipramine

## 2019-03-13 NOTE — Telephone Encounter (Signed)
Patient is returning your call.  

## 2019-04-10 NOTE — Telephone Encounter (Signed)
Called patient and left message to please come in for 1 month F/U CBC in our  basement. Bwt. 7:30am-3:30pm

## 2019-04-10 NOTE — Telephone Encounter (Signed)
-----   Message from Antony Blackbird, RN sent at 03/13/2019 10:35 AM EDT ----- Call pt. And have her come in for CBC ( 1 month f/u)

## 2019-04-19 ENCOUNTER — Other Ambulatory Visit (INDEPENDENT_AMBULATORY_CARE_PROVIDER_SITE_OTHER): Payer: BC Managed Care – PPO

## 2019-04-19 DIAGNOSIS — D72829 Elevated white blood cell count, unspecified: Secondary | ICD-10-CM

## 2019-04-19 LAB — CBC WITH DIFFERENTIAL/PLATELET
Basophils Absolute: 0.1 10*3/uL (ref 0.0–0.1)
Basophils Relative: 0.6 % (ref 0.0–3.0)
Eosinophils Absolute: 0.1 10*3/uL (ref 0.0–0.7)
Eosinophils Relative: 0.9 % (ref 0.0–5.0)
HCT: 42 % (ref 36.0–46.0)
Hemoglobin: 14.3 g/dL (ref 12.0–15.0)
Lymphocytes Relative: 29.3 % (ref 12.0–46.0)
Lymphs Abs: 3.2 10*3/uL (ref 0.7–4.0)
MCHC: 34.2 g/dL (ref 30.0–36.0)
MCV: 91.4 fl (ref 78.0–100.0)
Monocytes Absolute: 0.7 10*3/uL (ref 0.1–1.0)
Monocytes Relative: 6.7 % (ref 3.0–12.0)
Neutro Abs: 6.9 10*3/uL (ref 1.4–7.7)
Neutrophils Relative %: 62.5 % (ref 43.0–77.0)
Platelets: 274 10*3/uL (ref 150.0–400.0)
RBC: 4.59 Mil/uL (ref 3.87–5.11)
RDW: 12.8 % (ref 11.5–15.5)
WBC: 11 10*3/uL — ABNORMAL HIGH (ref 4.0–10.5)

## 2019-10-18 ENCOUNTER — Ambulatory Visit: Payer: BC Managed Care – PPO

## 2019-10-22 ENCOUNTER — Ambulatory Visit: Payer: BC Managed Care – PPO

## 2019-10-31 ENCOUNTER — Other Ambulatory Visit: Payer: Self-pay

## 2019-10-31 ENCOUNTER — Ambulatory Visit (INDEPENDENT_AMBULATORY_CARE_PROVIDER_SITE_OTHER): Payer: BC Managed Care – PPO | Admitting: Obstetrics

## 2019-10-31 ENCOUNTER — Other Ambulatory Visit (HOSPITAL_COMMUNITY)
Admission: RE | Admit: 2019-10-31 | Discharge: 2019-10-31 | Disposition: A | Discharge status: Home / Self Care | Payer: BC Managed Care – PPO | Source: Ambulatory Visit | Attending: Obstetrics | Admitting: Obstetrics

## 2019-10-31 ENCOUNTER — Encounter: Payer: Self-pay | Admitting: Obstetrics

## 2019-10-31 VITALS — BP 115/73 | HR 91 | Ht 67.0 in | Wt 261.9 lb

## 2019-10-31 DIAGNOSIS — Z01419 Encounter for gynecological examination (general) (routine) without abnormal findings: Secondary | ICD-10-CM | POA: Insufficient documentation

## 2019-10-31 DIAGNOSIS — N898 Other specified noninflammatory disorders of vagina: Secondary | ICD-10-CM

## 2019-10-31 DIAGNOSIS — N944 Primary dysmenorrhea: Secondary | ICD-10-CM

## 2019-10-31 DIAGNOSIS — Z113 Encounter for screening for infections with a predominantly sexual mode of transmission: Secondary | ICD-10-CM

## 2019-10-31 DIAGNOSIS — Z131 Encounter for screening for diabetes mellitus: Secondary | ICD-10-CM

## 2019-10-31 MED ORDER — OXYCODONE-ACETAMINOPHEN 5-325 MG PO TABS: 1.0000 | ORAL_TABLET | ORAL | 0 refills | Status: DC | PRN

## 2019-10-31 NOTE — Progress Notes (Signed)
Pt presents for AEX. Last pap 12/2017 Normal. Pt also desires to have std testing.

## 2019-10-31 NOTE — Progress Notes (Signed)
Subjective:        Tanya Terry is a 33 y.o. female here for a routine exam.  Current complaints: None.    Personal health questionnaire:  Is patient Ashkenazi Jewish, have a family history of breast and/or ovarian cancer: no Is there a family history of uterine cancer diagnosed at age < 39, gastrointestinal cancer, urinary tract cancer, family member who is a Field seismologist syndrome-associated carrier: no Is the patient overweight and hypertensive, family history of diabetes, personal history of gestational diabetes, preeclampsia or PCOS: yes Is patient over 39, have PCOS,  family history of premature CHD under age 66, diabetes, smoke, have hypertension or peripheral artery disease:  no At any time, has a partner hit, kicked or otherwise hurt or frightened you?: no Over the past 2 weeks, have you felt down, depressed or hopeless?: no Over the past 2 weeks, have you felt little interest or pleasure in doing things?:no   Gynecologic History Patient's last menstrual period was 10/26/2019. Contraception: none Last Pap: 05-29-2018. Results were: normal Last mammogram: n/a. Results were: n/a  Obstetric History OB History  Gravida Para Term Preterm AB Living  3 3 3  0 0 3  SAB TAB Ectopic Multiple Live Births  0 0 0 0 3    # Outcome Date GA Lbr Len/2nd Weight Sex Delivery Anes PTL Lv  3 Term 03/15/17 [redacted]w[redacted]d 00:25 7 lb 13.8 oz (3.565 kg) F Vag-Spont EPI  LIV  2 Term 12/15/11 [redacted]w[redacted]d 03:09 8 lb 3.8 oz (3.735 kg) M Vag-Spont Local  LIV     Birth Comments: wnl  1 Term 2006   7 lb 11 oz (3.487 kg) F Vag-Spont   LIV    Past Medical History:  Diagnosis Date  . Benign heart murmur   . Constipation   . Depression   . Diabetes mellitus without complication ([place])   . GERD (gastroesophageal reflux disease)   . Peptic ulcer   . Ulcer     Past Surgical History:  Procedure Laterality Date  . MYRINGOTOMY     with tubes  . UPPER GASTROINTESTINAL ENDOSCOPY    . WISDOM TOOTH EXTRACTION        Current Outpatient Medications:  .  acetaminophen (TYLENOL) 500 MG tablet, Take 500 mg by mouth every 6 (six) hours as needed for mild pain or moderate pain. , Disp: , Rfl:  .  linaclotide (LINZESS) 145 MCG CAPS capsule, Take 1 capsule (145 mcg total) by mouth daily before breakfast., Disp: 30 capsule, Rfl: 11 Allergies  Allergen Reactions  . Augmentin [Amoxicillin-Pot Clavulanate] Anaphylaxis, Shortness Of Breath and Swelling    Has patient had a PCN reaction causing immediate rash, facial/tongue/throat swelling, SOB or lightheadedness with hypotension: Yes Has patient had a PCN reaction causing severe rash involving mucus membranes or skin necrosis: Yes Has patient had a PCN reaction that required hospitalization No Has patient had a PCN reaction occurring within the last 10 years: Yes If all of the above answers are "NO", then may proceed with Cephalosporin use.   . Sulfa Antibiotics Anaphylaxis, Shortness Of Breath and Swelling    Social History   Tobacco Use  . Smoking status: Former Smoker    Types: Cigarettes    Quit date: 10/04/2012    Years since quitting: 7.0  . Smokeless tobacco: Never Used  Substance Use Topics  . Alcohol use: Not Currently    Family History  Problem Relation Age of Onset  . Diabetes Mother   . Hypertension Mother   .  Heart disease Father   . Hypertension Father   . Anesthesia problems Neg Hx   . Hypotension Neg Hx   . Malignant hyperthermia Neg Hx   . Pseudochol deficiency Neg Hx   . Colon cancer Neg Hx       Review of Systems  Constitutional: negative for fatigue and weight loss Respiratory: negative for cough and wheezing Cardiovascular: negative for chest pain, fatigue and palpitations Gastrointestinal: negative for abdominal pain and change in bowel habits Musculoskeletal:negative for myalgias Neurological: negative for gait problems and tremors Behavioral/Psych: negative for abusive relationship, depression Endocrine: negative for  temperature intolerance    Genitourinary:positive for heavy and painful periods Integument/breast: negative for breast lump, breast tenderness, nipple discharge and skin lesion(s)    Objective:       BP 115/73   Pulse 91   Ht 5\' 7"  (1.702 m)   Wt 261 lb 14.4 oz (118.8 kg)   LMP 10/26/2019   BMI 41.02 kg/m  General:   alert  Skin:   no rash or abnormalities  Lungs:   clear to auscultation bilaterally  Heart:   regular rate and rhythm, S1, S2 normal, no murmur, click, rub or gallop  Breasts:   normal without suspicious masses, skin or nipple changes or axillary nodes  Abdomen:  normal findings: no organomegaly, soft, non-tender and no hernia  Pelvis:  External genitalia: normal general appearance Urinary system: urethral meatus normal and bladder without fullness, nontender Vaginal: normal without tenderness, induration or masses Cervix: normal appearance Adnexa: normal bimanual exam Uterus: anteverted and non-tender, normal size   Lab Review Urine pregnancy test Labs reviewed yes Radiologic studies reviewed no  50% of 25 min visit spent on counseling and coordination of care.   Assessment:    Healthy female exam.    Plan:    Education reviewed: calcium supplements, depression evaluation, low fat, low cholesterol diet and weight bearing exercise. Contraception: none. Follow up in: 1 year.   No orders of the defined types were placed in this encounter.  Orders Placed This Encounter  Procedures  . RPR  . HIV antibody (with reflex)  . Hepatitis C Antibody  . Hepatitis B Surface AntiGEN  . Hemoglobin A1c    10/28/2019, MD 10/31/2019 4:11 PM

## 2019-11-01 LAB — HEMOGLOBIN A1C
Est. average glucose Bld gHb Est-mCnc: 100 mg/dL
Hgb A1c MFr Bld: 5.1 % (ref 4.8–5.6)

## 2019-11-01 LAB — RPR: RPR Ser Ql: NONREACTIVE

## 2019-11-01 LAB — HIV ANTIBODY (ROUTINE TESTING W REFLEX): HIV Screen 4th Generation wRfx: NONREACTIVE

## 2019-11-01 LAB — HEPATITIS B SURFACE ANTIGEN: Hepatitis B Surface Ag: NEGATIVE

## 2019-11-01 LAB — HEPATITIS C ANTIBODY: Hep C Virus Ab: <0.1 s/co ratio (ref 0.0–0.9)

## 2019-11-05 ENCOUNTER — Other Ambulatory Visit: Payer: Self-pay | Admitting: Obstetrics

## 2019-11-05 DIAGNOSIS — B9689 Other specified bacterial agents as the cause of diseases classified elsewhere: Secondary | ICD-10-CM

## 2019-11-05 LAB — CERVICOVAGINAL ANCILLARY ONLY
Bacterial Vaginitis (gardnerella): POSITIVE — AB
Candida Glabrata: NEGATIVE
Candida Vaginitis: NEGATIVE
Chlamydia: NEGATIVE
Comment: NEGATIVE
Comment: NEGATIVE
Comment: NEGATIVE
Comment: NEGATIVE
Comment: NEGATIVE
Comment: NORMAL
Neisseria Gonorrhea: NEGATIVE
Trichomonas: NEGATIVE

## 2019-11-05 MED ORDER — METRONIDAZOLE 500 MG PO TABS: 500.0000 mg | ORAL_TABLET | Freq: Two times a day (BID) | ORAL | 2 refills | Status: DC

## 2019-11-08 LAB — CYTOLOGY - PAP
Diagnosis: NEGATIVE
High risk HPV: NEGATIVE

## 2019-11-12 ENCOUNTER — Ambulatory Visit: Payer: BC Managed Care – PPO | Admitting: Obstetrics

## 2020-02-22 ENCOUNTER — Other Ambulatory Visit: Payer: Self-pay | Admitting: Gastroenterology

## 2020-02-22 DIAGNOSIS — K219 Gastro-esophageal reflux disease without esophagitis: Secondary | ICD-10-CM

## 2020-03-18 IMAGING — DX DG CHEST 2V
2 series · 2 of 2 positions shown · non-contrast
Comparison: Radiographs May 29, 2018.

CLINICAL DATA: Chest pain, shortness of breath.

EXAM:
CHEST - 2 VIEW

[chest pa]
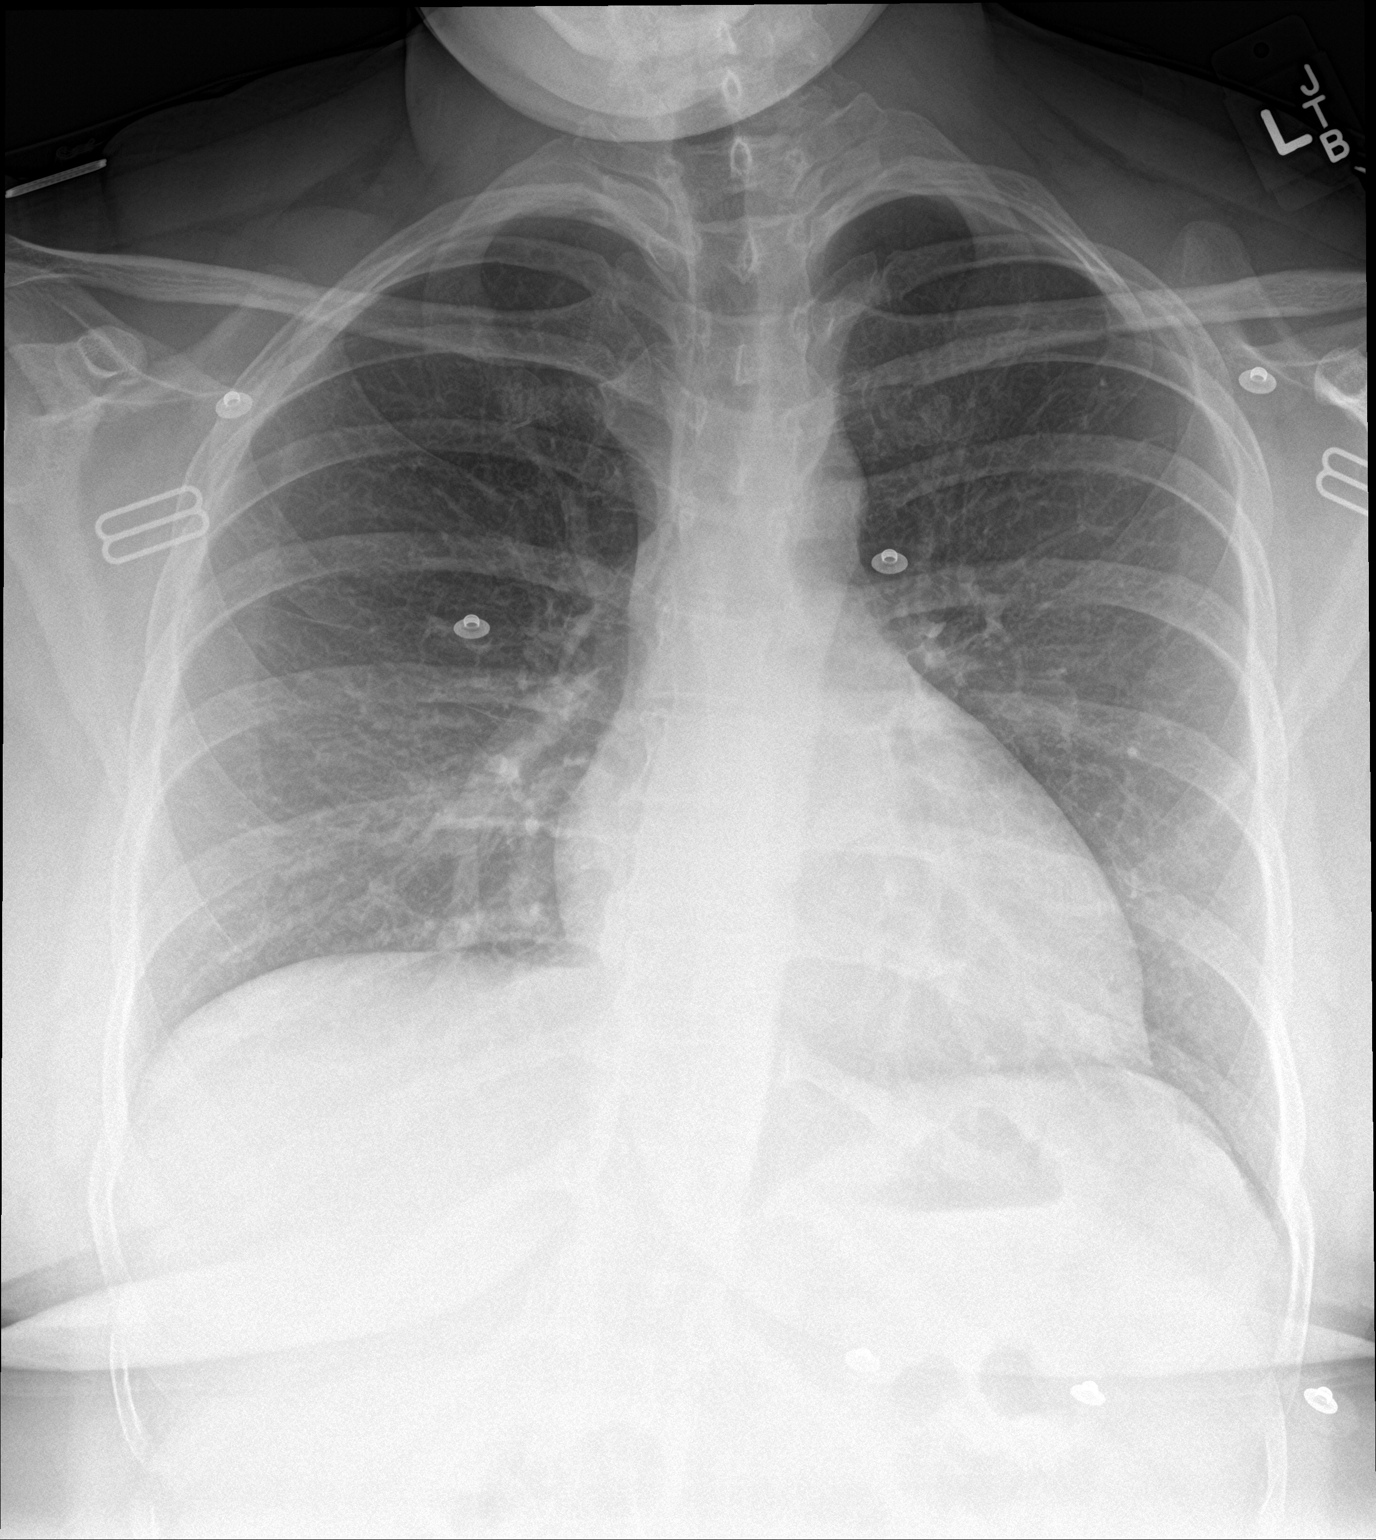

[chest lat]
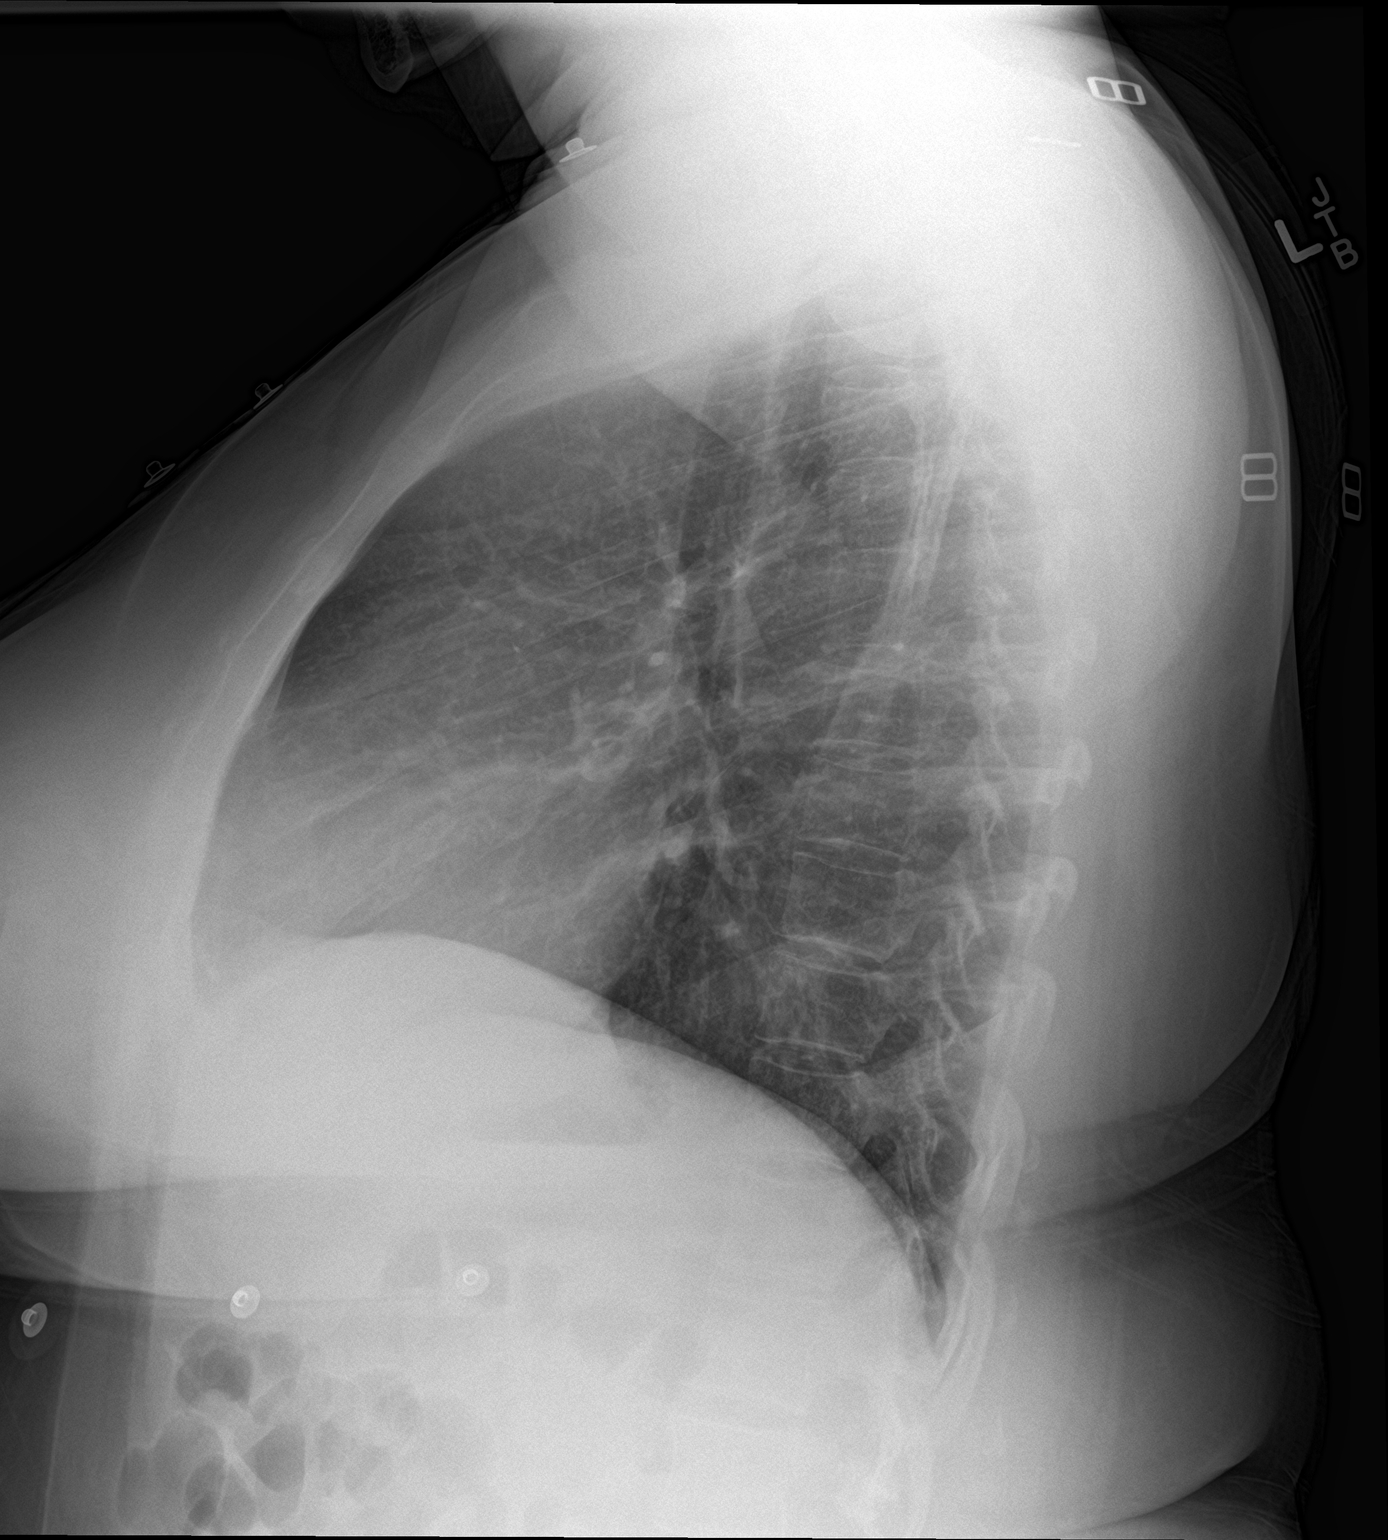

[2 of 2 positions shown; findings below may reference images not displayed]

FINDINGS: The heart size and mediastinal contours are within normal limits.
Both lungs are clear. No pneumothorax or pleural effusion is noted.
The visualized skeletal structures are unremarkable.
IMPRESSION: No active cardiopulmonary disease.

## 2020-07-04 ENCOUNTER — Other Ambulatory Visit: Payer: Self-pay | Admitting: Sleep Medicine

## 2020-07-04 ENCOUNTER — Other Ambulatory Visit: Payer: BC Managed Care – PPO

## 2020-07-04 DIAGNOSIS — Z20822 Contact with and (suspected) exposure to covid-19: Secondary | ICD-10-CM

## 2020-07-06 LAB — SARS-COV-2, NAA 2 DAY TAT

## 2020-07-06 LAB — NOVEL CORONAVIRUS, NAA: SARS-CoV-2, NAA: NOT DETECTED

## 2021-07-02 ENCOUNTER — Encounter (HOSPITAL_BASED_OUTPATIENT_CLINIC_OR_DEPARTMENT_OTHER): Payer: Self-pay

## 2021-07-02 ENCOUNTER — Emergency Department (HOSPITAL_BASED_OUTPATIENT_CLINIC_OR_DEPARTMENT_OTHER)
Admission: EM | Admit: 2021-07-02 | Discharge: 2021-07-02 | Payer: Medicaid Other | Attending: Emergency Medicine | Admitting: Emergency Medicine

## 2021-07-02 ENCOUNTER — Other Ambulatory Visit: Payer: Self-pay

## 2021-07-02 ENCOUNTER — Emergency Department (HOSPITAL_BASED_OUTPATIENT_CLINIC_OR_DEPARTMENT_OTHER): Payer: Medicaid Other | Admitting: Radiology

## 2021-07-02 DIAGNOSIS — Z87891 Personal history of nicotine dependence: Secondary | ICD-10-CM | POA: Diagnosis not present

## 2021-07-02 DIAGNOSIS — M25561 Pain in right knee: Secondary | ICD-10-CM

## 2021-07-02 DIAGNOSIS — E119 Type 2 diabetes mellitus without complications: Secondary | ICD-10-CM | POA: Insufficient documentation

## 2021-07-02 DIAGNOSIS — S80911A Unspecified superficial injury of right knee, initial encounter: Secondary | ICD-10-CM | POA: Diagnosis present

## 2021-07-02 MED ORDER — IBUPROFEN 800 MG PO TABS: 800.0000 mg | ORAL_TABLET | Freq: Three times a day (TID) | ORAL | 0 refills | Status: DC

## 2021-07-02 MED ORDER — HYDROCODONE-ACETAMINOPHEN 5-325 MG PO TABS
1.0000 | ORAL_TABLET | Freq: Once | ORAL | Status: AC
  Administered 2021-07-02: 1 via ORAL
  Filled 2021-07-02: qty 1
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
## 2021-07-10 ENCOUNTER — Other Ambulatory Visit: Payer: Self-pay | Admitting: Physician Assistant

## 2021-07-10 ENCOUNTER — Ambulatory Visit (INDEPENDENT_AMBULATORY_CARE_PROVIDER_SITE_OTHER): Payer: Medicaid Other | Admitting: Physician Assistant

## 2021-07-10 ENCOUNTER — Encounter: Payer: Self-pay | Admitting: Physician Assistant

## 2021-07-10 DIAGNOSIS — S83004A Unspecified dislocation of right patella, initial encounter: Secondary | ICD-10-CM | POA: Diagnosis not present

## 2021-07-10 DIAGNOSIS — S83006A Unspecified dislocation of unspecified patella, initial encounter: Secondary | ICD-10-CM

## 2021-07-10 MED ORDER — HYDROCODONE-ACETAMINOPHEN 5-325 MG PO TABS: 1.0000 | ORAL_TABLET | ORAL | 0 refills | Status: DC | PRN
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
## 2021-07-21 ENCOUNTER — Other Ambulatory Visit: Payer: Self-pay | Admitting: *Deleted

## 2021-07-21 ENCOUNTER — Encounter: Payer: Self-pay | Admitting: *Deleted

## 2021-07-21 ENCOUNTER — Telehealth: Payer: Self-pay | Admitting: *Deleted

## 2021-07-21 DIAGNOSIS — B3731 Acute candidiasis of vulva and vagina: Secondary | ICD-10-CM

## 2021-07-21 DIAGNOSIS — B373 Candidiasis of vulva and vagina: Secondary | ICD-10-CM

## 2021-07-21 MED ORDER — FLUCONAZOLE 150 MG PO TABS: 150.0000 mg | ORAL_TABLET | Freq: Once | ORAL | 0 refills | Status: AC

## 2021-07-21 NOTE — Telephone Encounter (Signed)
TC from patient reporting signs and symptoms of vaginal yeast infection. RX for Diflucan sent per protocol.

## 2021-07-24 ENCOUNTER — Ambulatory Visit (INDEPENDENT_AMBULATORY_CARE_PROVIDER_SITE_OTHER): Payer: Medicaid Other | Admitting: Physician Assistant

## 2021-07-24 ENCOUNTER — Other Ambulatory Visit: Payer: Self-pay

## 2021-07-24 ENCOUNTER — Encounter: Payer: Self-pay | Admitting: Physician Assistant

## 2021-07-24 DIAGNOSIS — S83006A Unspecified dislocation of unspecified patella, initial encounter: Secondary | ICD-10-CM

## 2021-07-24 MED ORDER — HYDROCODONE-ACETAMINOPHEN 5-325 MG PO TABS: 1.0000 | ORAL_TABLET | ORAL | 0 refills | Status: AC | PRN

## 2021-07-24 NOTE — Progress Notes (Signed)
Office Visit Note   Patient: Tanya Terry           Date of Birth: 15-Nov-1985           MRN: [id number] Visit Date: 07/24/2021              Requested by: Claiborne Rigg, NP [address] [place],  [place] [address] PCP: Claiborne Rigg, NP  Chief Complaint  Patient presents with   Right Knee - Follow-up      HPI: Patient presents today for follow-up on her right knee.  She is 2 weeks status post right knee patella dislocation.  This is the first time she dislocated her patella.  She has been in a knee immobilizer.  She says when she takes the knee immobilizer off to change close or to get into her car her patella dislocates and she manually reduces it.  She says this happens about 2 times a day.  She said the pain has gotten a little bit better though she still has significant amount and is wondering if she can have a refill of her medication  Assessment & Plan: Visit Diagnoses:  1. Patellar dislocation, initial encounter     Plan: Discussed the patient with Dr. August Saucer.  He recommends giving her recurrent dislocations in the last 2 weeks that she have an MRI and follow-up with him.  I discussed this in detail with the patient.  She is to remain in the knee immobilizer.  I have given her a refill of her hydrocodone.  Follow-Up Instructions: No follow-ups on file.   Ortho Exam  Patient is alert, oriented, no adenopathy, well-dressed, normal affect, normal respiratory effort. Examination of her knee she has no effusion.  She has no tenderness on the joint line.  Flexion of the knee given the injury was not attempted.  She does have some soft tissue swelling.  She has a positive apprehension sign and pain with compression of the patella.  Imaging: No results found. No images are attached to the encounter.  Labs: Lab Results  Component Value Date   HGBA1C 5.1 10/31/2019   HGBA1C 5.3 09/13/2013   REPTSTATUS 11/16/2016 FINAL 11/15/2016   CULT MULTIPLE SPECIES PRESENT,  SUGGEST RECOLLECTION (A) 11/15/2016   LABORGA ESCHERICHIA COLI 03/29/2008     Lab Results  Component Value Date   ALBUMIN 4.4 02/21/2019   ALBUMIN 3.2 (L) 03/14/2017    No results found for: MG Lab Results  Component Value Date   VD25OH 24.2 (L) 12/23/2016    No results found for: PREALBUMIN CBC EXTENDED Latest Ref Rng & Units 04/19/2019 03/12/2019 02/21/2019  WBC 4.0 - 10.5 K/uL 11.0(H) 11.5(H) 12.4(H)  RBC 3.87 - 5.11 Mil/uL 4.59 4.43 4.71  HGB 12.0 - 15.0 g/dL 64.4 03.4 74.2  HCT 59.5 - 46.0 % 42.0 40.4 43.1  PLT 150.0 - 400.0 K/uL 274.0 285.0 300.0  NEUTROABS 1.4 - 7.7 K/uL 6.9 7.4 8.3(H)  LYMPHSABS 0.7 - 4.0 K/uL 3.2 3.1 3.2     There is no height or weight on file to calculate BMI.  Orders:  Orders Placed This Encounter  Procedures   MR Knee Right w/o contrast   No orders of the defined types were placed in this encounter.    Procedures: No procedures performed  Clinical Data: No additional findings.  ROS:  All other systems negative, except as noted in the HPI. Review of Systems  Objective: Vital Signs: There were no vitals taken for this visit.  Specialty Comments:  No specialty comments available.  PMFS History: Patient Active Problem List   Diagnosis Date Noted   Gestational diabetes mellitus (GDM) affecting third pregnancy 03/14/2017   Supervision of high risk pregnancy, antepartum 02/17/2017   Gestational diabetes 01/20/2017   Past Medical History:  Diagnosis Date   Benign heart murmur    Constipation    Depression    Diabetes mellitus without complication (HCC)    GERD (gastroesophageal reflux disease)    Peptic ulcer    Ulcer     Family History  Problem Relation Age of Onset   Diabetes Mother    Hypertension Mother    Heart disease Father    Hypertension Father    Anesthesia problems Neg Hx    Hypotension Neg Hx    Malignant hyperthermia Neg Hx    Pseudochol deficiency Neg Hx    Colon cancer Neg Hx     Past Surgical  History:  Procedure Laterality Date   MYRINGOTOMY     with tubes   UPPER GASTROINTESTINAL ENDOSCOPY     WISDOM TOOTH EXTRACTION     Social History   Occupational History   Not on file  Tobacco Use   Smoking status: Former    Types: Cigarettes    Quit date: 10/04/2012    Years since quitting: 8.8   Smokeless tobacco: Never  Vaping Use   Vaping Use: Never used  Substance and Sexual Activity   Alcohol use: Not Currently   Drug use: Yes    Types: Marijuana   Sexual activity: Yes    Partners: Male    Birth control/protection: None

## 2021-07-29 ENCOUNTER — Telehealth: Payer: Self-pay

## 2021-07-29 ENCOUNTER — Ambulatory Visit: Payer: Medicaid Other | Admitting: Physician Assistant

## 2021-07-29 NOTE — Telephone Encounter (Signed)
Unsure if you are able to help patient obtain earlier appt?

## 2021-07-29 NOTE — Telephone Encounter (Signed)
Still pending authorization with pt insurance.

## 2021-07-29 NOTE — Telephone Encounter (Signed)
Pt called asking stating that she Is still having a lot of issues out of her knee cap popping out of place she has an MRI scheduled for 10/3 and she is going to see if she can get it moved up so she can get in with Dr. August Saucer sooner.

## 2021-08-06 NOTE — Telephone Encounter (Signed)
Resubmitted add'l clinicals to [facility] via uploaded. Pending review

## 2021-08-09 ENCOUNTER — Ambulatory Visit
Admission: RE | Admit: 2021-08-09 | Discharge: 2021-08-09 | Disposition: A | Discharge status: Home / Self Care | Payer: Medicaid Other | Source: Ambulatory Visit | Attending: Physician Assistant | Admitting: Physician Assistant

## 2021-08-09 ENCOUNTER — Other Ambulatory Visit: Payer: Self-pay

## 2021-08-09 DIAGNOSIS — S83006A Unspecified dislocation of unspecified patella, initial encounter: Secondary | ICD-10-CM

## 2021-08-10 ENCOUNTER — Encounter: Payer: Self-pay | Admitting: Orthopedic Surgery

## 2021-08-10 ENCOUNTER — Ambulatory Visit (INDEPENDENT_AMBULATORY_CARE_PROVIDER_SITE_OTHER): Payer: Medicaid Other

## 2021-08-10 ENCOUNTER — Ambulatory Visit (INDEPENDENT_AMBULATORY_CARE_PROVIDER_SITE_OTHER): Payer: Medicaid Other | Admitting: Orthopedic Surgery

## 2021-08-10 DIAGNOSIS — M545 Low back pain, unspecified: Secondary | ICD-10-CM

## 2021-08-10 MED ORDER — METHOCARBAMOL 500 MG PO TABS: 500.0000 mg | ORAL_TABLET | Freq: Three times a day (TID) | ORAL | 0 refills | Status: DC | PRN

## 2021-08-10 MED ORDER — PREDNISONE 5 MG (21) PO TBPK: ORAL_TABLET | ORAL | 0 refills | Status: DC
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
## 2021-08-12 NOTE — Telephone Encounter (Signed)
Pt approved per insurance, Berkley Harvey is in referral details

## 2021-08-13 ENCOUNTER — Encounter: Payer: Self-pay | Admitting: Obstetrics

## 2021-08-13 ENCOUNTER — Ambulatory Visit (INDEPENDENT_AMBULATORY_CARE_PROVIDER_SITE_OTHER): Payer: Medicaid Other | Admitting: Obstetrics

## 2021-08-13 ENCOUNTER — Other Ambulatory Visit (HOSPITAL_COMMUNITY)
Admission: RE | Admit: 2021-08-13 | Discharge: 2021-08-13 | Disposition: A | Discharge status: Home / Self Care | Payer: Medicaid Other | Source: Ambulatory Visit | Attending: Obstetrics | Admitting: Obstetrics

## 2021-08-13 ENCOUNTER — Other Ambulatory Visit: Payer: Self-pay

## 2021-08-13 VITALS — BP 121/75 | HR 93 | Wt 264.0 lb

## 2021-08-13 DIAGNOSIS — F320 Major depressive disorder, single episode, mild: Secondary | ICD-10-CM | POA: Diagnosis not present

## 2021-08-13 DIAGNOSIS — Z01419 Encounter for gynecological examination (general) (routine) without abnormal findings: Secondary | ICD-10-CM

## 2021-08-13 DIAGNOSIS — Z6841 Body Mass Index (BMI) 40.0 and over, adult: Secondary | ICD-10-CM

## 2021-08-13 DIAGNOSIS — B851 Pediculosis due to Pediculus humanus corporis: Secondary | ICD-10-CM | POA: Diagnosis present

## 2021-08-13 DIAGNOSIS — N898 Other specified noninflammatory disorders of vagina: Secondary | ICD-10-CM | POA: Diagnosis not present
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
## 2021-08-14 ENCOUNTER — Ambulatory Visit (INDEPENDENT_AMBULATORY_CARE_PROVIDER_SITE_OTHER): Payer: Medicaid Other | Admitting: Licensed Clinical Social Worker

## 2021-08-14 DIAGNOSIS — F4321 Adjustment disorder with depressed mood: Secondary | ICD-10-CM

## 2021-08-14 LAB — CERVICOVAGINAL ANCILLARY ONLY
Bacterial Vaginitis (gardnerella): POSITIVE — AB
Candida Glabrata: NEGATIVE
Candida Vaginitis: NEGATIVE
Chlamydia: NEGATIVE
Comment: NEGATIVE
Comment: NEGATIVE
Comment: NEGATIVE
Comment: NEGATIVE
Comment: NEGATIVE
Comment: NORMAL
Neisseria Gonorrhea: NEGATIVE
Trichomonas: NEGATIVE
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
## 2021-08-17 LAB — CYTOLOGY - PAP
Comment: NEGATIVE
Diagnosis: NEGATIVE
Diagnosis: REACTIVE
High risk HPV: NEGATIVE

## 2021-08-18 ENCOUNTER — Other Ambulatory Visit: Payer: Self-pay | Admitting: Obstetrics

## 2021-08-18 DIAGNOSIS — B9689 Other specified bacterial agents as the cause of diseases classified elsewhere: Secondary | ICD-10-CM

## 2021-08-18 DIAGNOSIS — N760 Acute vaginitis: Secondary | ICD-10-CM

## 2021-08-18 MED ORDER — METRONIDAZOLE 500 MG PO TABS: 500.0000 mg | ORAL_TABLET | Freq: Two times a day (BID) | ORAL | 2 refills | Status: DC

## 2021-08-19 ENCOUNTER — Ambulatory Visit: Payer: Medicaid Other | Admitting: Obstetrics

## 2021-08-20 ENCOUNTER — Other Ambulatory Visit: Payer: Self-pay | Admitting: Surgical

## 2021-08-28 ENCOUNTER — Other Ambulatory Visit: Payer: Self-pay

## 2021-08-28 ENCOUNTER — Ambulatory Visit (INDEPENDENT_AMBULATORY_CARE_PROVIDER_SITE_OTHER): Payer: Medicaid Other | Admitting: Licensed Clinical Social Worker

## 2021-08-28 ENCOUNTER — Ambulatory Visit (HOSPITAL_COMMUNITY)
Admission: EM | Admit: 2021-08-28 | Discharge: 2021-08-28 | Disposition: A | Discharge status: Home / Self Care | Payer: Medicaid Other | Attending: Family | Admitting: Family

## 2021-08-28 DIAGNOSIS — F331 Major depressive disorder, recurrent, moderate: Secondary | ICD-10-CM

## 2021-08-28 DIAGNOSIS — F4321 Adjustment disorder with depressed mood: Secondary | ICD-10-CM | POA: Diagnosis not present

## 2021-08-28 MED ORDER — FLUCONAZOLE 150 MG PO TABS: 150.0000 mg | ORAL_TABLET | Freq: Once | ORAL | 0 refills | Status: AC

## 2021-08-28 NOTE — BH Specialist Note (Signed)
Integrated Behavioral Health via Telemedicine Visit  08/28/2021 Tanya Terry [id number]  Number of Integrated Behavioral Health visits: 2 Session Start time: 900am  Session End time: 926am Total time: 26 mins via mychart   Referring Provider: Aron Baba MD Patient/Family location: Home  [facility] Provider location: [facility]  All persons participating in visit: Pt  Types of Service: Individual psychotherapy  I connected with Tanya Terry and/or Tanya Terry's n/a via  Telephone or Video Enabled Telemedicine Application  (Video is Caregility application) and verified that I am speaking with the correct person using two identifiers. Discussed confidentiality: Yes   I discussed the limitations of telemedicine and the availability of in person appointments.  Discussed there is a possibility of technology failure and discussed alternative modes of communication if that failure occurs.  I discussed that engaging in this telemedicine visit, they consent to the provision of behavioral healthcare and the services will be billed under their insurance.  Patient and/or legal guardian expressed understanding and consented to Telemedicine visit: Yes   Presenting Concerns: Patient and/or family reports the following symptoms/concerns: depressed mood  Duration of problem: approx 3 months ; Severity of problem: mild  Patient and/or Family's Strengths/Protective Factors: Concrete supports in place (healthy food, safe environments, etc.)  Goals Addressed: Patient will:  Reduce symptoms of: depression and mood instability   Increase knowledge and/or ability of: coping skills   Demonstrate ability to: Increase adequate support systems for patient/family  Progress towards Goals: Achieved  Interventions: Interventions utilized:  Supportive Counseling Standardized Assessments completed: Not Needed  Patient and/or Family Response: Ms.Biermann is very tearful during mychart video. Ms. Trimarco denies  thoughts of self harm. Ms. Achey express full understanding of [facility]. Ms. Malachi was also given community counseling resources   Assessment: Patient currently experiencing depression.   Patient may benefit from community mental health.  Plan: Follow up with behavioral health clinician on : n/a Behavioral recommendations: contact counseling resources for family counseling  Referral(s): Community Mental Health Services (LME/Outside Clinic)  I discussed the assessment and treatment plan with the patient and/or parent/guardian. They were provided an opportunity to ask questions and all were answered. They agreed with the plan and demonstrated an understanding of the instructions.   They were advised to call back or seek an in-person evaluation if the symptoms worsen or if the condition fails to improve as anticipated.  Gwyndolyn Saxon, LCSW

## 2021-08-28 NOTE — ED Provider Notes (Signed)
Behavioral Health Urgent Care Medical Screening Exam  Patient Name: Tanya Terry MRN: [id number] Date of Evaluation: 08/28/21 Chief Complaint:   Diagnosis:  Final diagnoses:  MDD (major depressive disorder), recurrent episode, moderate (HCC)    History of Present illness: Tanya Terry is a 35 y.o. female presents to [facility] urgent care after referral by her therapist.(Andrea, [place] [facility])  she denies suicidal or homicidal ideations.  Denies auditory or visual hallucinations.  Reports a history of depression during her teen years denies that she is followed by psychiatry currently.   She reports worsening depression after finding out that her husband has been cheating on her for the past 3 years.  States she recently found out 2 weeks ago.  States she has been unable to sleep, eat and has mood irritability.  States she continues to get mixed signals by her husband.  She reported "he wants to work it out one minute  and then he is ready for divorce the next."  Reports " my husband told me I blacked out 4 days ago and do not remember any of this."  Stated she was advised to follow-up at [facility] urgent care facility.  NP discussed treatment options however patient declined.  Patient was offered partial hospitalization programming and or walk-in clinic for psychiatry. " I keep getting the run around telling everyone  my business and nobody is trying to help me."    Patient left this assessment. Declined additional outpatient resources.   During evaluation Tanya Terry is sitting in no acute distress. She is alert/oriented x 4; calm/cooperative; and mood congruent with affect.  She is speaking in a clear tone at moderate volume, and normal pace; with good eye contact. Her thought process is coherent and relevant; There is no indication that she is currently responding to internal/external stimuli or experiencing delusional thought content; and she has denied  suicidal/self-harm/homicidal ideation, psychosis, and paranoia. Patient has remained calm throughout assessment and has answered questions appropriately.    At this time Tanya Terry is educated and verbalizes understanding of mental health resources and other crisis services in the community. She is instructed to call 911 and present to the nearest emergency room should she experience any suicidal/homicidal ideation, auditory/visual/hallucinations, or detrimental worsening of her mental health condition. She was a also advised by Clinical research associate that she could call the toll-free phone on insurance card to assist with identifying in network counselors and agencies or number on back of Medicaid card to speak with care coordinator   Presentation  General Appearance:Appropriate for Environment  Eye Contact:Good  Speech:Clear and Coherent  Speech Volume:Normal  Handedness:Right   Mood and Affect  Mood:Anxious; Irritable  Affect:Congruent; Tearful   Thought Process  Thought Processes:Coherent  Descriptions of Associations:Intact  Orientation:Full (Time, Place and Person)  Thought Content:Logical    Hallucinations:None  Ideas of Reference:None  Suicidal Thoughts:No  Homicidal Thoughts:No   Sensorium  Memory:Immediate Good; Recent Good; Remote Good  Judgment:Fair  Insight:Fair   Executive Functions  Concentration:Good  Attention Span:Good  Recall:Good  Fund of Knowledge:Good  Language:Good   Psychomotor Activity  Psychomotor Activity:Normal   Assets  Assets:Desire for Improvement; Social Support   Sleep  Sleep:Fair  Number of hours: No data recorded  Nutritional Assessment (For OBS and FBC admissions only) Has the patient had a weight loss or gain of 10 pounds or more in the last 3 months?: No Has the patient had a decrease in food intake/or appetite?: No Does  the patient have dental problems?: No Does the patient have eating habits or behaviors that  may be indicators of an eating disorder including binging or inducing vomiting?: No Has the patient recently lost weight without trying?: 0 Has the patient been eating poorly because of a decreased appetite?: 0 Malnutrition Screening Tool Score: 0   Physical Exam: Physical Exam ROS There were no vitals taken for this visit. There is no height or weight on file to calculate BMI.  Musculoskeletal: Strength & Muscle Tone: within normal limits Gait & Station: normal Patient leans: N/A   [facility] MSE Discharge Disposition for Follow up and Recommendations: Based on my evaluation the patient does not appear to have an emergency medical condition and can be discharged with resources and follow up care in outpatient services for Medication Management, Partial Hospitalization Program, and Group Therapy  Patient declined resources    Oneta Rack, NP 08/28/2021, 12:09 PM

## 2021-08-28 NOTE — Discharge Summary (Signed)
Tanya Terry to be D/C'd Home per NP order.  Pt left facility before receiving AVS. Pt is discharged at this time.  Dickie La  08/28/2021 12:27 PM

## 2021-08-28 NOTE — Discharge Instructions (Signed)
Take all medications as prescribed. Keep all follow-up appointments as scheduled.  Do not consume alcohol or use illegal drugs while on prescription medications. Report any adverse effects from your medications to your primary care provider promptly.  In the event of recurrent symptoms or worsening symptoms, call 911, a crisis hotline, or go to the nearest emergency department for evaluation.   

## 2021-08-28 NOTE — Telephone Encounter (Signed)
Patient is requesting a refill on Diflucan. 

## 2021-08-31 ENCOUNTER — Telehealth (HOSPITAL_COMMUNITY): Payer: Self-pay | Admitting: Nurse Practitioner

## 2021-08-31 NOTE — BH Assessment (Signed)
Care Management - Follow Up [facility] Discharges   Writer made contact with the patient. Patient reports that she has established services with a therapist at her doctor's office

## 2021-09-07 ENCOUNTER — Telehealth: Payer: Self-pay

## 2021-09-07 NOTE — Telephone Encounter (Signed)
Insurance denied scan. Tanya Terry is requesting an expedited appeal on this. Can you please arrange this?

## 2021-09-14 NOTE — Telephone Encounter (Signed)
The denial expired on 08/26/21, went in to [facility] to see if can resubmit, pending auth

## 2021-09-16 NOTE — Telephone Encounter (Signed)
Just checked portal for [employer] and is still in review.

## 2021-09-17 ENCOUNTER — Telehealth (INDEPENDENT_AMBULATORY_CARE_PROVIDER_SITE_OTHER): Payer: Medicaid Other | Admitting: Obstetrics

## 2021-09-17 ENCOUNTER — Encounter: Payer: Self-pay | Admitting: Obstetrics

## 2021-09-17 DIAGNOSIS — Z131 Encounter for screening for diabetes mellitus: Secondary | ICD-10-CM | POA: Diagnosis not present

## 2021-09-17 DIAGNOSIS — F321 Major depressive disorder, single episode, moderate: Secondary | ICD-10-CM | POA: Diagnosis not present

## 2021-09-17 DIAGNOSIS — Z6841 Body Mass Index (BMI) 40.0 and over, adult: Secondary | ICD-10-CM

## 2021-09-17 DIAGNOSIS — Z0000 Encounter for general adult medical examination without abnormal findings: Secondary | ICD-10-CM

## 2021-09-17 MED ORDER — SERTRALINE HCL 50 MG PO TABS: 50.0000 mg | ORAL_TABLET | Freq: Every day | ORAL | 11 refills | Status: DC

## 2021-09-17 NOTE — Progress Notes (Signed)
Follow up visit regarding [facility] referral. Still having symptoms. Unable to get regular medication and counseling due to bill unpaid at primary care MD.
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
## 2021-09-23 ENCOUNTER — Telehealth: Payer: Self-pay | Admitting: *Deleted

## 2021-09-23 NOTE — Telephone Encounter (Signed)
I called back to pt after getting disconnected and told her to call office to schedule a follow up with [facility] for ov. Pending appt with pt.

## 2021-09-30 ENCOUNTER — Other Ambulatory Visit: Payer: Self-pay | Admitting: *Deleted

## 2021-09-30 DIAGNOSIS — B379 Candidiasis, unspecified: Secondary | ICD-10-CM

## 2021-09-30 MED ORDER — FLUCONAZOLE 150 MG PO TABS: 150.0000 mg | ORAL_TABLET | Freq: Once | ORAL | 0 refills | Status: AC

## 2021-09-30 NOTE — Progress Notes (Signed)
Pt called to office with request for yeast treatment. Diflucan sent per protocol.

## 2021-10-09 ENCOUNTER — Ambulatory Visit: Payer: Medicaid Other | Admitting: Surgical

## 2023-01-11 ENCOUNTER — Encounter: Payer: Self-pay | Admitting: Cardiology

## 2023-01-11 ENCOUNTER — Ambulatory Visit (INDEPENDENT_AMBULATORY_CARE_PROVIDER_SITE_OTHER): Payer: Medicaid Other

## 2023-01-11 ENCOUNTER — Ambulatory Visit: Payer: Medicaid Other | Attending: Cardiology | Admitting: Cardiology

## 2023-01-11 VITALS — BP 110/82 | HR 100 | Ht 67.0 in | Wt 289.0 lb

## 2023-01-11 DIAGNOSIS — R55 Syncope and collapse: Secondary | ICD-10-CM

## 2023-01-11 DIAGNOSIS — K5900 Constipation, unspecified: Secondary | ICD-10-CM | POA: Diagnosis not present

## 2023-01-11 DIAGNOSIS — R079 Chest pain, unspecified: Secondary | ICD-10-CM

## 2023-01-11 DIAGNOSIS — R0609 Other forms of dyspnea: Secondary | ICD-10-CM

## 2023-01-11 NOTE — Patient Instructions (Addendum)
Medication Instructions:  Your physician recommends that you continue on your current medications as directed. Please refer to the Current Medication list given to you today.  *If you need a refill on your cardiac medications before your next appointment, please call your pharmacy*  Testing/Procedures: Your physician has requested that you have an echocardiogram. Echocardiography is a painless test that uses sound waves to create images of your heart. It provides your doctor with information about the size and shape of your heart and how well your heart's chambers and valves are working. This procedure takes approximately one hour. There are no restrictions for this procedure. Please do NOT wear cologne, perfume, aftershave, or lotions (deodorant is allowed). Please arrive 15 minutes prior to your appointment time.  CARDIAC PET- Your physician has requested that you have a Cardiac Pet Stress Test. This testing is completed at [facility] ([place], [place] [address]). The schedulers will call you to get this scheduled. Please follow instructions below and call the office with any questions/concerns [phone]).   ZIO XT- Long Term Monitor Instructions   Your physician has requested you wear a ZIO patch monitor for _14_ days.  This is a single patch monitor.   [employer] supplies one patch monitor per enrollment. Additional stickers are not available. Please do not apply patch if you will be having a Nuclear Stress Test, Echocardiogram, Cardiac CT, MRI, or Chest Xray during the period you would be wearing the monitor. The patch cannot be worn during these tests. You cannot remove and re-apply the ZIO XT patch monitor.  Your ZIO patch monitor will be sent Fed Ex from [employer] directly to your home address. It may take 3-5 days to receive your monitor after you have been enrolled.  Once you have received your monitor, please review the enclosed  instructions. Your monitor has already been registered assigning a specific monitor serial # to you.  Billing and Patient Assistance Program Information   We have supplied [employer] with any of your insurance information on file for billing purposes. [employer] offers a sliding scale Patient Assistance Program for patients that do not have insurance, or whose insurance does not completely cover the cost of the ZIO monitor.   You must apply for the Patient Assistance Program to qualify for this discounted rate.     To apply, please call [employer] at [phone], select option 4, then select option 2, and ask to apply for Patient Assistance Program.  Theodore Demark will ask your household income, and how many people are in your household.  They will quote your out-of-pocket cost based on that information.  [employer] will also be able to set up a 50-month interest-free payment plan if needed.  Applying the monitor   Shave hair from upper left chest.  Hold abrader disc by orange tab. Rub abrader in 40 strokes over the upper left chest as indicated in your monitor instructions.  Clean area with 4 enclosed alcohol pads. Let dry.  Apply patch as indicated in monitor instructions. Patch will be placed under collarbone on left side of chest with arrow pointing upward.  Rub patch adhesive wings for 2 minutes. Remove white label marked "1". Remove the white label marked "2". Rub patch adhesive wings for 2 additional minutes.  While looking in a mirror, press and release button in center of patch. A small green light will flash 3-4 times. This will be your only indicator that the monitor has been turned on. ?  Do  not shower for the first 24 hours. You may shower after the first 24 hours.  Press the button if you feel a symptom. You will hear a small click. Record Date, Time and Symptom in the Patient Logbook.  When you are ready to remove the patch, follow instructions on the last 2 pages of the Patient Logbook. Stick patch  monitor onto the last page of Patient Logbook.  Place Patient Logbook in the blue and white box.  Use locking tab on box and tape box closed securely.  The blue and white box has prepaid postage on it. Please place it in the mailbox as soon as possible. Your physician should have your test results approximately 7 days after the monitor has been mailed back to [facility].  Call [facility] at [phone] if you have questions regarding your ZIO XT patch monitor. Call them immediately if you see an orange light blinking on your monitor.  If your monitor falls off in less than 4 days, contact our Monitor department at [phone]. ?If your monitor becomes loose or falls off after 4 days call [employer] at [phone] for suggestions on securing your monitor.?  Follow-Up: At [facility], you and your health needs are our priority.  As part of our continuing mission to provide you with exceptional heart care, we have created designated Provider Care Teams.  These Care Teams include your primary Cardiologist (physician) and Advanced Practice Providers (APPs -  Physician Assistants and Nurse Practitioners) who all work together to provide you with the care you need, when you need it.  We recommend signing up for the patient portal called "MyChart".  Sign up information is provided on this After Visit Summary.  MyChart is used to connect with patients for Virtual Visits (Telemedicine).  Patients are able to view lab/test results, encounter notes, upcoming appointments, etc.  Non-urgent messages can be sent to your provider as well.   To learn more about what you can do with MyChart, go to [email or web address].    Your next appointment:   3 month(s)  Provider:   Dr. Joya San have been referred to: Gastroenterology   Other Instructions How to Prepare for Your Cardiac PET/CT Stress Test:  1. Please do not take these medications before your test:   Medications  that may interfere with the cardiac pharmacological stress agent (ex. nitrates - including erectile dysfunction medications, isosorbide mononitrate, tamulosin or beta-blockers) the day of the exam. (Erectile dysfunction medication should be held for at least 72 hrs prior to test) Theophylline containing medications for 12 hours. Dipyridamole 48 hours prior to the test. Your remaining medications may be taken with water.  2. Nothing to eat or drink, except water, 3 hours prior to arrival time.   NO caffeine/decaffeinated products, or chocolate 12 hours prior to arrival.  3. NO perfume, cologne or lotion  4. Total time is 1 to 2 hours; you may want to bring reading material for the waiting time.  5. Please report to Radiology at the [facility] Main Entrance 30 minutes early for your test.  [place], [place] [id number]  Diabetic Preparation:  Hold oral medications. You may take NPH and Lantus insulin. Do not take Humalog or Humulin R (Regular Insulin) the day of your test. Check blood sugars prior to leaving the house. If able to eat breakfast prior to 3 hour fasting, you may take all medications, including your insulin, Do not worry if you miss your  breakfast dose of insulin - start at your next meal.  IF YOU THINK YOU MAY BE PREGNANT, OR ARE NURSING PLEASE INFORM THE TECHNOLOGIST.  In preparation for your appointment, medication and supplies will be purchased.  Appointment availability is limited, so if you need to cancel or reschedule, please call the Radiology Department at [phone]  24 hours in advance to avoid a cancellation fee of $100.00  What to Expect After you Arrive:  Once you arrive and check in for your appointment, you will be taken to a preparation room within the Radiology Department.  A technologist or Nurse will obtain your medical history, verify that you are correctly prepped for the exam, and explain the procedure.  Afterwards,  an IV  will be started in your arm and electrodes will be placed on your skin for EKG monitoring during the stress portion of the exam. Then you will be escorted to the PET/CT scanner.  There, staff will get you positioned on the scanner and obtain a blood pressure and EKG.  During the exam, you will continue to be connected to the EKG and blood pressure machines.  A small, safe amount of a radioactive tracer will be injected in your IV to obtain a series of pictures of your heart along with an injection of a stress agent.    After your Exam:  It is recommended that you eat a meal and drink a caffeinated beverage to counter act any effects of the stress agent.  Drink plenty of fluids for the remainder of the day and urinate frequently for the first couple of hours after the exam.  Your doctor will inform you of your test results within 7-10 business days.  For questions about your test or how to prepare for your test, please call: Marchia Bond, Cardiac Imaging Nurse Navigator  Gordy Clement, Cardiac Imaging Nurse Navigator Office: [phone]

## 2023-01-11 NOTE — Progress Notes (Signed)
Cardiology Office Note:    Date:  01/11/2023   ID:  Tanya Terry, DOB Nov 08, 1986, MRN [id number]  PCP:  Gildardo Pounds, NP  Cardiologist:  None  Electrophysiologist:  None   Referring MD: Gildardo Pounds, NP   Chief Complaint  Patient presents with   Loss of Consciousness    History of Present Illness:    Tanya Terry is a 37 y.o. female with a hx of GERD, PUD, diabetes who is referred by Geryl Rankins, NP for evaluation of syncope.  She reports frequent episodes of syncope.  States that it occurs about 1-2 times per month.  Reports with each episodes she starts to feel nauseated, has abdominal discomfort, then starts to get tunnel vision and see spots, then will pass out.  Notices episodes seems to correlate with when she has a bowel movement.  Also reports has been having shortness of breath with minimal exertion.  States that just walking short distances she will feel short of breath.  Also reports chest tightness with minimal exertion.  Reports also having palpitations that occur during syncopal episodes.  She smoked for 10 years about 5 to 6 cigarettes/day.  Family's includes father has had multiple MIs, first at age 73 and has heart failure; mother has heart failure.   Past Medical History:  Diagnosis Date   Benign heart murmur    Constipation    Depression    Diabetes mellitus without complication (HCC)    GERD (gastroesophageal reflux disease)    Peptic ulcer    Ulcer     Past Surgical History:  Procedure Laterality Date   MYRINGOTOMY     with tubes   UPPER GASTROINTESTINAL ENDOSCOPY     WISDOM TOOTH EXTRACTION      Current Medications: Current Meds  Medication Sig   triamcinolone cream (KENALOG) 0.1 % APPLY A THIN LAYER OF CREAM TO AFFECTED AREA(S) TWICE DAILY   Vitamin D, Ergocalciferol, (DRISDOL) 1.25 MG (50000 UNIT) CAPS capsule Take 50,000 Units by mouth 2 (two) times a week.     Allergies:   Augmentin [amoxicillin-pot clavulanate] and Sulfa  antibiotics   Social History   Socioeconomic History   Marital status: Married    Spouse name: Not on file   Number of children: Not on file   Years of education: Not on file   Highest education level: Not on file  Occupational History   Not on file  Tobacco Use   Smoking status: Former    Types: Cigarettes    Quit date: 10/04/2012    Years since quitting: 10.2   Smokeless tobacco: Never  Vaping Use   Vaping Use: Never used  Substance and Sexual Activity   Alcohol use: Not Currently   Drug use: Yes    Types: Marijuana   Sexual activity: Yes    Partners: Male    Birth control/protection: None  Other Topics Concern   Not on file  Social History Narrative   Not on file   Social Determinants of Health   Financial Resource Strain: Not on file  Food Insecurity: Not on file  Transportation Needs: Not on file  Physical Activity: Not on file  Stress: Not on file  Social Connections: Not on file     Family History: The patient's family history includes Diabetes in her mother; Heart disease in her father; Hypertension in her father and mother. There is no history of Anesthesia problems, Hypotension, Malignant hyperthermia, Pseudochol deficiency, or Colon cancer.  ROS:  Please see the history of present illness.     All other systems reviewed and are negative.  EKGs/Labs/Other Studies Reviewed:    The following studies were reviewed today:   EKG:   01/11/2023: Normal sinus rhythm, rate 100, nonspecific T wave flattening, QTc 464  Recent Labs: No results found for requested labs within last 365 days.  Recent Lipid Panel No results found for: "CHOL", "TRIG", "HDL", "CHOLHDL", "VLDL", "LDLCALC", "LDLDIRECT"  Physical Exam:    VS:  BP 110/82   Pulse 100   Ht '5\' 7"'$  (1.702 m)   Wt 289 lb (131.1 kg)   BMI 45.26 kg/m     Wt Readings from Last 3 Encounters:  01/11/23 289 lb (131.1 kg)  08/13/21 264 lb (119.7 kg)  07/02/21 256 lb (116.1 kg)     GEN:  Well  nourished, well developed in no acute distress HEENT: Normal NECK: No JVD; No carotid bruits LYMPHATICS: No lymphadenopathy CARDIAC: RRR, no murmurs, rubs, gallops RESPIRATORY:  Clear to auscultation without rales, wheezing or rhonchi  ABDOMEN: Soft, non-tender, non-distended MUSCULOSKELETAL:  No edema; No deformity  SKIN: Warm and dry NEUROLOGIC:  Alert and oriented x 3 PSYCHIATRIC:  Normal affect   ASSESSMENT:    1. Syncope, unspecified syncope type   2. Chest pain of uncertain etiology   3. Constipation, unspecified constipation type   4. DOE (dyspnea on exertion)    PLAN:    Syncope: Description suggests vasovagal syncope given prodromal symptoms.  Seems to be triggered by bowel movements, which she reports she has rarely due to issues with constipation.  Will refer to GI.  Also discussed if having prodromal symptoms to lie on the ground until symptoms pass.  Check echocardiogram to rule structural heart disease.  She does report palpitations during episodes, will check Zio patch x 2 weeks to evaluate for arrhythmia  Chest pain/DOE: She is reporting exertional chest tightness and shortness of breath, concerning for angina.  Despite age, does have CAD risk factors (smoking history, family history).  Not a good coronary CTA candidate given BMI and significantly elevated heart rate.  Plan stress PET to evaluate for ischemia  RTC in 3 months  Shared Decision Making/Informed Consent The risks [chest pain, shortness of breath, cardiac arrhythmias, dizziness, blood pressure fluctuations, myocardial infarction, stroke/transient ischemic attack, nausea, vomiting, allergic reaction, radiation exposure, metallic taste sensation and life-threatening complications (estimated to be 1 in 10,000)], benefits (risk stratification, diagnosing coronary artery disease, treatment guidance) and alternatives of a cardiac PET stress test were discussed in detail with Tanya Terry and she agrees to  proceed.   Medication Adjustments/Labs and Tests Ordered: Current medicines are reviewed at length with the patient today.  Concerns regarding medicines are outlined above.  Orders Placed This Encounter  Procedures   NM PET [facility]   Ambulatory referral to Gastroenterology   LONG TERM MONITOR (3-14 DAYS)   EKG 12-Lead   ECHOCARDIOGRAM COMPLETE   No orders of the defined types were placed in this encounter.   Patient Instructions  Medication Instructions:  Your physician recommends that you continue on your current medications as directed. Please refer to the Current Medication list given to you today.  *If you need a refill on your cardiac medications before your next appointment, please call your pharmacy*  Testing/Procedures: Your physician has requested that you have an echocardiogram. Echocardiography is a painless test that uses sound waves to create images of your heart. It provides your doctor with  information about the size and shape of your heart and how well your heart's chambers and valves are working. This procedure takes approximately one hour. There are no restrictions for this procedure. Please do NOT wear cologne, perfume, aftershave, or lotions (deodorant is allowed). Please arrive 15 minutes prior to your appointment time.  CARDIAC PET- Your physician has requested that you have a Cardiac Pet Stress Test. This testing is completed at [facility] ([place], [place] [address]). The schedulers will call you to get this scheduled. Please follow instructions below and call the office with any questions/concerns [phone]).   ZIO XT- Long Term Monitor Instructions   Your physician has requested you wear a ZIO patch monitor for _14_ days.  This is a single patch monitor.   [employer] supplies one patch monitor per enrollment. Additional stickers are not available. Please do not apply patch if you  will be having a Nuclear Stress Test, Echocardiogram, Cardiac CT, MRI, or Chest Xray during the period you would be wearing the monitor. The patch cannot be worn during these tests. You cannot remove and re-apply the ZIO XT patch monitor.  Your ZIO patch monitor will be sent Fed Ex from [employer] directly to your home address. It may take 3-5 days to receive your monitor after you have been enrolled.  Once you have received your monitor, please review the enclosed instructions. Your monitor has already been registered assigning a specific monitor serial # to you.  Billing and Patient Assistance Program Information   We have supplied [employer] with any of your insurance information on file for billing purposes. [employer] offers a sliding scale Patient Assistance Program for patients that do not have insurance, or whose insurance does not completely cover the cost of the ZIO monitor.   You must apply for the Patient Assistance Program to qualify for this discounted rate.     To apply, please call [employer] at [phone], select option 4, then select option 2, and ask to apply for Patient Assistance Program.  Tanya Terry will ask your household income, and how many people are in your household.  They will quote your out-of-pocket cost based on that information.  [employer] will also be able to set up a 52-month interest-free payment plan if needed.  Applying the monitor   Shave hair from upper left chest.  Hold abrader disc by orange tab. Rub abrader in 40 strokes over the upper left chest as indicated in your monitor instructions.  Clean area with 4 enclosed alcohol pads. Let dry.  Apply patch as indicated in monitor instructions. Patch will be placed under collarbone on left side of chest with arrow pointing upward.  Rub patch adhesive wings for 2 minutes. Remove white label marked "1". Remove the white label marked "2". Rub patch adhesive wings for 2 additional minutes.  While looking in a mirror,  press and release button in center of patch. A small green light will flash 3-4 times. This will be your only indicator that the monitor has been turned on. ?  Do not shower for the first 24 hours. You may shower after the first 24 hours.  Press the button if you feel a symptom. You will hear a small click. Record Date, Time and Symptom in the Patient Logbook.  When you are ready to remove the patch, follow instructions on the last 2 pages of the Patient Logbook. Stick patch monitor onto the last page of Patient Logbook.  Place Patient Logbook in  the blue and white box.  Use locking tab on box and tape box closed securely.  The blue and white box has prepaid postage on it. Please place it in the mailbox as soon as possible. Your physician should have your test results approximately 7 days after the monitor has been mailed back to [facility].  Call Tanya Terry at [phone] if you have questions regarding your ZIO XT patch monitor. Call them immediately if you see an orange light blinking on your monitor.  If your monitor falls off in less than 4 days, contact our Monitor department at [phone]. ?If your monitor becomes loose or falls off after 4 days call [employer] at [phone] for suggestions on securing your monitor.?  Follow-Up: At [facility], you and your health needs are our priority.  As part of our continuing mission to provide you with exceptional heart care, we have created designated Provider Care Teams.  These Care Teams include your primary Cardiologist (physician) and Advanced Practice Providers (APPs -  Physician Assistants and Nurse Practitioners) who all work together to provide you with the care you need, when you need it.  We recommend signing up for the patient portal called "MyChart".  Sign up information is provided on this After Visit Summary.  MyChart is used to connect with patients for Virtual Visits (Telemedicine).  Patients are able to  view lab/test results, encounter notes, upcoming appointments, etc.  Non-urgent messages can be sent to your provider as well.   To learn more about what you can do with MyChart, go to [email or web address].    Your next appointment:   3 month(s)  Provider:   Dr. Joya Terry have been referred to: Gastroenterology   Other Instructions How to Prepare for Your Cardiac PET/CT Stress Test:  1. Please do not take these medications before your test:   Medications that may interfere with the cardiac pharmacological stress agent (ex. nitrates - including erectile dysfunction medications, isosorbide mononitrate, tamulosin or beta-blockers) the day of the exam. (Erectile dysfunction medication should be held for at least 72 hrs prior to test) Theophylline containing medications for 12 hours. Dipyridamole 48 hours prior to the test. Your remaining medications may be taken with water.  2. Nothing to eat or drink, except water, 3 hours prior to arrival time.   NO caffeine/decaffeinated products, or chocolate 12 hours prior to arrival.  3. NO perfume, cologne or lotion  4. Total time is 1 to 2 hours; you may want to bring reading material for the waiting time.  5. Please report to Radiology at the [facility] Main Entrance 30 minutes early for your test.  [place], [place] [address]  Diabetic Preparation:  Hold oral medications. You may take NPH and Lantus insulin. Do not take Humalog or Humulin R (Regular Insulin) the day of your test. Check blood sugars prior to leaving the house. If able to eat breakfast prior to 3 hour fasting, you may take all medications, including your insulin, Do not worry if you miss your breakfast dose of insulin - start at your next meal.  IF YOU THINK YOU MAY BE PREGNANT, OR ARE NURSING PLEASE INFORM THE TECHNOLOGIST.  In preparation for your appointment, medication and supplies will be purchased.  Appointment availability is  limited, so if you need to cancel or reschedule, please call the Radiology Department at [phone]  24 hours in advance to avoid a cancellation fee of $100.00  What to Expect After  you Arrive:  Once you arrive and check in for your appointment, you will be taken to a preparation room within the Radiology Department.  A technologist or Nurse will obtain your medical history, verify that you are correctly prepped for the exam, and explain the procedure.  Afterwards,  an IV will be started in your arm and electrodes will be placed on your skin for EKG monitoring during the stress portion of the exam. Then you will be escorted to the PET/CT scanner.  There, staff will get you positioned on the scanner and obtain a blood pressure and EKG.  During the exam, you will continue to be connected to the EKG and blood pressure machines.  A small, safe amount of a radioactive tracer will be injected in your IV to obtain a series of pictures of your heart along with an injection of a stress agent.    After your Exam:  It is recommended that you eat a meal and drink a caffeinated beverage to counter act any effects of the stress agent.  Drink plenty of fluids for the remainder of the day and urinate frequently for the first couple of hours after the exam.  Your doctor will inform you of your test results within 7-10 business days.  For questions about your test or how to prepare for your test, please call: Marchia Bond, Cardiac Imaging Nurse Navigator  Gordy Clement, Cardiac Imaging Nurse Navigator Office: [phone]     Signed, Donato Heinz, MD  01/11/2023 9:24 AM    Tanya Terry

## 2023-01-11 NOTE — Progress Notes (Unsigned)
Enrolled for [employer] to mail a ZIO XT long term holter monitor to the patients address on file.  

## 2023-01-13 DIAGNOSIS — R55 Syncope and collapse: Secondary | ICD-10-CM

## 2023-01-19 ENCOUNTER — Encounter: Payer: Self-pay | Admitting: Physician Assistant

## 2023-01-31 ENCOUNTER — Ambulatory Visit (INDEPENDENT_AMBULATORY_CARE_PROVIDER_SITE_OTHER): Payer: 59

## 2023-01-31 ENCOUNTER — Telehealth (HOSPITAL_COMMUNITY): Payer: Self-pay | Admitting: *Deleted

## 2023-01-31 DIAGNOSIS — R55 Syncope and collapse: Secondary | ICD-10-CM

## 2023-01-31 LAB — ECHOCARDIOGRAM COMPLETE
Area-P 1/2: 3.48 cm2
S' Lateral: 3.48 cm

## 2023-01-31 NOTE — Telephone Encounter (Signed)
Reaching out to patient to offer assistance regarding upcoming cardiac imaging study; pt verbalizes understanding of appt date/time, parking situation and where to check in, pre-test NPO status verified current allergies; name and call back number provided for further questions should they arise  Gordy Clement RN Navigator Cardiac Imaging [facility] [phone] office [phone] cell  Patient aware to avoid caffeine 12 hours prior to her cardiac PET scan.

## 2023-02-02 ENCOUNTER — Ambulatory Visit (HOSPITAL_COMMUNITY)
Admission: RE | Admit: 2023-02-02 | Discharge: 2023-02-02 | Disposition: A | Discharge status: Home / Self Care | Payer: 59 | Source: Ambulatory Visit | Attending: Cardiology | Admitting: Cardiology

## 2023-02-02 DIAGNOSIS — R079 Chest pain, unspecified: Secondary | ICD-10-CM | POA: Insufficient documentation

## 2023-02-02 LAB — NM PET CT CARDIAC PERFUSION MULTI W/ABSOLUTE BLOODFLOW
MBFR: 2.78
Nuc Rest EF: 61 %
Nuc Stress EF: 64 %
Rest MBF: 0.93 ml/g/min
Rest Nuclear Isotope Dose: 30 mCi
ST Depression (mm): 0 mm
Stress MBF: 2.59 ml/g/min
Stress Nuclear Isotope Dose: 30 mCi
TID: 0.85

## 2023-02-02 MED ORDER — REGADENOSON 0.4 MG/5ML IV SOLN: 0.4000 mg | Freq: Once | INTRAVENOUS | Status: AC

## 2023-02-02 MED ORDER — REGADENOSON 0.4 MG/5ML IV SOLN
INTRAVENOUS | Status: AC
  Administered 2023-02-02: 0.4 mg via INTRAVENOUS
  Filled 2023-02-02: qty 5

## 2023-02-02 MED ORDER — RUBIDIUM RB82 GENERATOR (RUBYFILL)
30.0000 | PACK | Freq: Once | INTRAVENOUS | Status: AC
  Administered 2023-02-02: 30 via INTRAVENOUS

## 2023-03-02 ENCOUNTER — Ambulatory Visit: Payer: Medicaid Other | Admitting: Physician Assistant

## 2023-03-07 ENCOUNTER — Other Ambulatory Visit: Payer: Self-pay

## 2023-03-07 ENCOUNTER — Encounter (HOSPITAL_BASED_OUTPATIENT_CLINIC_OR_DEPARTMENT_OTHER): Payer: Self-pay | Admitting: Emergency Medicine

## 2023-03-07 ENCOUNTER — Emergency Department (HOSPITAL_BASED_OUTPATIENT_CLINIC_OR_DEPARTMENT_OTHER): Payer: 59

## 2023-03-07 ENCOUNTER — Emergency Department (HOSPITAL_BASED_OUTPATIENT_CLINIC_OR_DEPARTMENT_OTHER)
Admission: EM | Admit: 2023-03-07 | Discharge: 2023-03-07 | Disposition: A | Discharge status: Home / Self Care | Payer: 59 | Attending: Emergency Medicine | Admitting: Emergency Medicine

## 2023-03-07 DIAGNOSIS — M25572 Pain in left ankle and joints of left foot: Secondary | ICD-10-CM | POA: Diagnosis present

## 2023-03-07 DIAGNOSIS — X501XXA Overexertion from prolonged static or awkward postures, initial encounter: Secondary | ICD-10-CM | POA: Diagnosis not present

## 2023-03-07 DIAGNOSIS — S93492A Sprain of other ligament of left ankle, initial encounter: Secondary | ICD-10-CM | POA: Insufficient documentation

## 2023-03-07 NOTE — ED Provider Notes (Signed)
EMERGENCY DEPARTMENT AT [facility] Provider Note   CSN: [id number] Arrival date & time: 03/07/23  1730     History  Chief Complaint  Patient presents with   Ankle Injury    Tanya Terry is a 37 y.o. female.  37 yo F with a chief complaint of left ankle pain.  The patient said that her foot was asleep and when she tried to plant her foot on the ground she rolled her ankle.  This happened a couple days ago.  She denies any pain to the foot or the knee.   Ankle Injury       Home Medications Prior to Admission medications   Medication Sig Start Date End Date Taking? Authorizing Provider  triamcinolone cream (KENALOG) 0.1 % APPLY A THIN LAYER OF CREAM TO AFFECTED AREA(S) TWICE DAILY    [provider]  Vitamin D, Ergocalciferol, (DRISDOL) 1.25 MG (50000 UNIT) CAPS capsule Take 50,000 Units by mouth 2 (two) times a week. 12/21/22   [provider]      Allergies    Augmentin [amoxicillin-pot clavulanate], Sulfa antibiotics, and Amoxicillin    Review of Systems   Review of Systems  Physical Exam Updated Vital Signs BP (!) 120/92 (BP Location: Left Arm)   Pulse (!) 128   Temp 98.4 F (36.9 C) (Oral)   Resp 15   Ht 5\' 8"  (1.727 m)   Wt 133.8 kg   SpO2 97%   BMI 44.85 kg/m  Physical Exam Vitals and nursing note reviewed.  Constitutional:      General: She is not in acute distress.    Appearance: She is well-developed. She is not diaphoretic.  HENT:     Head: Normocephalic and atraumatic.  Eyes:     Pupils: Pupils are equal, round, and reactive to light.  Cardiovascular:     Rate and Rhythm: Normal rate and regular rhythm.     Heart sounds: No murmur heard.    No friction rub. No gallop.  Pulmonary:     Effort: Pulmonary effort is normal.     Breath sounds: No wheezing or rales.  Abdominal:     General: There is no distension.     Palpations: Abdomen is soft.     Tenderness: There is no abdominal tenderness.   Musculoskeletal:        General: Swelling and tenderness present.     Cervical back: Normal range of motion and neck supple.     Comments: Mild pain and swelling to the left ankle worse about the lateral portion of the ankle.  Mild pain about the medial and lateral malleolus.  Pain worse along the anterior talofibular ligament she has no pain at the base of the fifth metatarsal or the navicular.  No pain at the fibular neck.  Pulse motor and sensation intact distally.  Skin:    General: Skin is warm and dry.  Neurological:     Mental Status: She is alert and oriented to person, place, and time.  Psychiatric:        Behavior: Behavior normal.     ED Results / Procedures / Treatments   Labs (all labs ordered are listed, but only abnormal results are displayed) Labs Reviewed - No data to display  EKG None  Radiology DG Ankle Complete Left  Result Date: 03/07/2023 CLINICAL DATA:  Pt c/o left ankle pain after rolling her foot yesterday. Denies previous injuries EXAM: LEFT ANKLE COMPLETE - 3+ VIEW COMPARISON:  None  Available. FINDINGS: There is no evidence of fracture, dislocation, or joint effusion. Plantar calcaneal spur. There is no evidence of arthropathy or other focal bone abnormality. Subcutaneus soft tissue edema. IMPRESSION: No acute displaced fracture or dislocation. Electronically Signed   By: Tish Frederickson M.D.   On: 03/07/2023 18:07    Procedures Procedures    Medications Ordered in ED Medications - No data to display  ED Course/ Medical Decision Making/ A&P                             Medical Decision Making Amount and/or Complexity of Data Reviewed Radiology: ordered.   37 yo F with a chief complaint of left ankle pain.  Going on since yesterday after she had an inversion injury.  Plain film independently interpreted by me without fracture.  Likely sprain.  Offered ASO and crutches and patient requesting a cam boot.  PCP follow-up.  6:30 PM:  I have discussed  the diagnosis/risks/treatment options with the patient.  Evaluation and diagnostic testing in the emergency department does not suggest an emergent condition requiring admission or immediate intervention beyond what has been performed at this time.  They will follow up with PCP. We also discussed returning to the ED immediately if new or worsening sx occur. We discussed the sx which are most concerning (e.g., sudden worsening pain, fever, inability to tolerate by mouth) that necessitate immediate return. Medications administered to the patient during their visit and any new prescriptions provided to the patient are listed below.  Medications given during this visit Medications - No data to display   The patient appears reasonably screen and/or stabilized for discharge and I doubt any other medical condition or other [facility] requiring further screening, evaluation, or treatment in the ED at this time prior to discharge.          Final Clinical Impression(s) / ED Diagnoses Final diagnoses:  Sprain of anterior talofibular ligament of left ankle, initial encounter    Rx / DC Orders ED Discharge Orders     None         Melene Plan, DO 03/07/23 1830

## 2023-03-07 NOTE — ED Notes (Signed)
Pt discharged to home using teachback Method. Discharge instructions have been discussed with patient and/or family members. Pt verbally acknowledges understanding d/c instructions, has been given opportunity for questions to be answered, and endorses comprehension to checkout at registration before leaving.  

## 2023-03-07 NOTE — Discharge Instructions (Signed)
Take 4 over the counter ibuprofen tablets 3 times a day or 2 over-the-counter naproxen tablets twice a day for pain. Also take tylenol 1000mg(2 extra strength) four times a day.    

## 2023-03-07 NOTE — ED Triage Notes (Signed)
Pt arrives to ED with c/o left ankle pain after rolling her foot yesterday.

## 2023-03-11 ENCOUNTER — Encounter: Payer: Self-pay | Admitting: Nurse Practitioner

## 2023-03-11 ENCOUNTER — Ambulatory Visit (INDEPENDENT_AMBULATORY_CARE_PROVIDER_SITE_OTHER): Payer: 59 | Admitting: Nurse Practitioner

## 2023-03-11 ENCOUNTER — Other Ambulatory Visit (INDEPENDENT_AMBULATORY_CARE_PROVIDER_SITE_OTHER): Payer: 59

## 2023-03-11 VITALS — BP 110/76 | HR 103 | Ht 67.0 in | Wt 285.0 lb

## 2023-03-11 DIAGNOSIS — K5900 Constipation, unspecified: Secondary | ICD-10-CM | POA: Diagnosis not present

## 2023-03-11 DIAGNOSIS — R1030 Lower abdominal pain, unspecified: Secondary | ICD-10-CM

## 2023-03-11 LAB — CBC WITH DIFFERENTIAL/PLATELET
Basophils Absolute: 0 10*3/uL (ref 0.0–0.1)
Basophils Relative: 0.4 % (ref 0.0–3.0)
Eosinophils Absolute: 0 10*3/uL (ref 0.0–0.7)
Eosinophils Relative: 0.4 % (ref 0.0–5.0)
HCT: 42 % (ref 36.0–46.0)
Hemoglobin: 14.6 g/dL (ref 12.0–15.0)
Lymphocytes Relative: 19.6 % (ref 12.0–46.0)
Lymphs Abs: 2.4 10*3/uL (ref 0.7–4.0)
MCHC: 34.7 g/dL (ref 30.0–36.0)
MCV: 91.7 fl (ref 78.0–100.0)
Monocytes Absolute: 0.8 10*3/uL (ref 0.1–1.0)
Monocytes Relative: 7 % (ref 3.0–12.0)
Neutro Abs: 8.7 10*3/uL — ABNORMAL HIGH (ref 1.4–7.7)
Neutrophils Relative %: 72.6 % (ref 43.0–77.0)
Platelets: 299 10*3/uL (ref 150.0–400.0)
RBC: 4.58 Mil/uL (ref 3.87–5.11)
RDW: 12.9 % (ref 11.5–15.5)
WBC: 12 10*3/uL — ABNORMAL HIGH (ref 4.0–10.5)

## 2023-03-11 LAB — COMPREHENSIVE METABOLIC PANEL
ALT: 14 U/L (ref 0–35)
AST: 15 U/L (ref 0–37)
Albumin: 4.3 g/dL (ref 3.5–5.2)
Alkaline Phosphatase: 63 U/L (ref 39–117)
BUN: 10 mg/dL (ref 6–23)
CO2: 28 mEq/L (ref 19–32)
Calcium: 9.6 mg/dL (ref 8.4–10.5)
Chloride: 104 mEq/L (ref 96–112)
Creatinine, Ser: 0.86 mg/dL (ref 0.40–1.20)
GFR: 86.38 mL/min (ref >60.00)
Glucose, Bld: 93 mg/dL (ref 70–99)
Potassium: 4.1 mEq/L (ref 3.5–5.1)
Sodium: 140 mEq/L (ref 135–145)
Total Bilirubin: 0.6 mg/dL (ref 0.2–1.2)
Total Protein: 7 g/dL (ref 6.0–8.3)

## 2023-03-11 LAB — TSH: TSH: 1.86 u[IU]/mL (ref 0.35–5.50)

## 2023-03-11 LAB — C-REACTIVE PROTEIN: CRP: <1 mg/dL (ref 0.5–20.0)

## 2023-03-11 MED ORDER — NA SULFATE-K SULFATE-MG SULF 17.5-3.13-1.6 GM/177ML PO SOLN: 1.0000 | Freq: Once | ORAL | 0 refills | Status: AC

## 2023-03-11 NOTE — Patient Instructions (Signed)
You have been scheduled for a colonoscopy. Please follow written instructions given to you at your visit today.  Please pick up your prep supplies at the pharmacy within the next 1-3 days. If you use inhalers (even only as needed), please bring them with you on the day of your procedure.  You have been scheduled for a CT scan of the abdomen and pelvis at [facility], 1st floor Radiology. You are scheduled on 03/29/23 at 5:30 pm. You should arrive 15 minutes prior to your appointment time for registration.  Make sure you have nothing to eat or drink after 1:30 pm.  If you have any questions regarding your exam or if you need to reschedule, you may call [facility] at [phone] between the hours of 8:00 am and 5:00 pm, Monday-Friday.   Your provider has requested that you go to the basement level for lab work before leaving today. Press "B" on the elevator. The lab is located at the first door on the left as you exit the elevator.  Due to recent changes in healthcare laws, you may see the results of your imaging and laboratory studies on MyChart before your provider has had a chance to review them.  We understand that in some cases there may be results that are confusing or concerning to you. Not all laboratory results come back in the same time frame and the provider may be waiting for multiple results in order to interpret others.  Please give [place] 48 hours in order for your provider to thoroughly review all the results before contacting the office for clarification of your results.    Thank you for trusting me with your gastrointestinal care!   Alcide Evener, CRNP

## 2023-03-11 NOTE — Progress Notes (Unsigned)
03/11/2023 Tanya Terry [id number] Jun 19, 1986   CHIEF COMPLAINT: Abdominal pain, constipation   HISTORY OF PRESENT ILLNESS: Tanya Terry is a 37 year old female with a history of depression, DM type II, GERD, H. Pylori 2003 and chronic constipation. She presents to our office today as referred by Dr. Bjorn Pippin for further evaluation regarding constipation. He endorses having chronic constipation with central and lower abdominal pain which started in 2020 and has progressively worsened over the 8 or 9 months. She describes her abdominal pain is severe at times, her mouth waters, she develops nausea and strains to pass a soft brown nonbloody BM. These episodes occur once or twice weekly. She feels extremely tired after these episodes. She sometimes vomits yellow emesis during these attacks, occurs twice monthly. No hematemesis. She recently had an episodes of lower abdominal pain when she felt light headed, dizzy and passed. She was at home by herself at that time, so she is not sure how long she was unconscious. She was referred to cardiology to further evaluate near syncopal episodes, syncopal episode and she also endorsed having SOB with chests tightness with minimal exertion. She was seen by cardiologist, Dr. Bjorn Pippin. She underwent a stress PET 02/02/2023 which was normal/low risk study. Dr. Bjorn Pippin assessed her syncopal episodes were suggestive of vasovagal syncope and a Zio patch  and ECHO were ordered with recommendations for GI evaluation. ECHO 01/31/2023 was normal. Zio patch was worn for 37 hours, no significant arrhythmia was seen.   No further syncopal episodes. She continues to have episodes of severe lower abdominal pain, forces bowel movements out with associated fatigue. Last night, she passed 4 to 5 soft stools which significant pushing. She tried taking Linzess a few years ago which caused "nonstop diarrhea" so she stopped taking it. She takes an OTC colon cleanse as needed. She  reduced red meat intake. She denies ever undergoing a colonoscopy. Mother with history of colon polyps. Father was diagnosed with colon cancer at the age of 38, he had recurrence of colon cancer with liver metastasis and died at the age of 58. She became a tearful when speaking about her father's passing.   She underwent an EGD 12/2018 which showed grade A reflux esophagitis and mild gastritis treated with PPI bid and Carafate. No evidence of H. Pylori.   CTAP 01/2019 and RUQ sono 02/2019 were unrevealing.   She was prescribed Phentermine 03/07/2023 by her PCP for weight loss.      Latest Ref Rng & Units 04/19/2019    3:52 PM 03/12/2019    2:26 PM 02/21/2019    2:59 PM  CBC  WBC 4.0 - 10.5 K/uL 11.0  11.5  12.4   Hemoglobin 12.0 - 15.0 g/dL 40.9  81.1  91.4   Hematocrit 36.0 - 46.0 % 42.0  40.4  43.1   Platelets 150.0 - 400.0 K/uL 274.0  285.0  300.0        Latest Ref Rng & Units 02/21/2019    2:59 PM 12/22/2018    3:12 PM 05/29/2018   12:02 PM  CMP  Glucose 70 - 99 mg/dL 82  89  782   BUN 6 - 23 mg/dL 11  8  7    Creatinine 0.40 - 1.20 mg/dL 9.56  2.13  0.86   Sodium 135 - 145 mEq/L 139  140  141   Potassium 3.5 - 5.1 mEq/L 4.2  3.8  4.1   Chloride 96 - 112 mEq/L 104  109  104   CO2 19 - 32 mEq/L 27  25  26    Calcium 8.4 - 10.5 mg/dL 9.4  9.1  9.1   Total Protein 6.0 - 8.3 g/dL 6.9     Total Bilirubin 0.2 - 1.2 mg/dL 0.4     Alkaline Phos 39 - 117 U/L 66     AST 0 - 37 U/L 12     ALT 0 - 35 U/L 18       Cardiac PET Scan 02/02/2023:   LV perfusion is normal. There is no evidence of ischemia. There is no evidence of infarction.   Rest left ventricular function is normal. Rest EF: 61 %. Stress left ventricular function is normal. Stress EF: 64 %. End diastolic cavity size is normal. End systolic cavity size is normal.   Myocardial blood flow was computed to be 0.77ml/g/min at rest and 2.21ml/g/min at stress. Global myocardial blood flow reserve was 2.78 and was normal.   Coronary calcium  was absent on the attenuation correction CT images.   The study is normal. The study is low risk.  ECHO 01/31/2023: IMPRESSIONS Left ventricular ejection fraction, by estimation, is 55 to 60%. The left ventricle has normal function. The left ventricle has no regional wall motion abnormalities. Left ventricular diastolic parameters were normal. The average left ventricular global longitudinal strain is -19.4 %. The global longitudinal strain is normal. 1. 2. Right ventricular systolic function is normal. The right ventricular size is normal. The mitral valve is normal in structure. Trivial mitral valve regurgitation. No evidence of mitral stenosis. 3. The aortic valve is tricuspid. Aortic valve regurgitation is not visualized. No aortic stenosis is present. 4. The inferior vena cava is normal in size with greater than 50% respiratory variability, suggesting right atrial pressure of 3 mmHg.   CTAP 02/05/2019: Lower chest: Lung bases are clear.   Hepatobiliary: 5 mm cyst inferiorly in the right hepatic lobe (coronal image 50). Liver is otherwise within normal limits.   Gallbladder is unremarkable. No intrahepatic or extrahepatic ductal dilatation.   Pancreas: Within normal limits.   Spleen: Within normal limits.   Adrenals/Urinary Tract: Adrenal glands are within normal limits.   Kidneys are within normal limits.  No hydronephrosis.   Bladder is within normal limits.   Stomach/Bowel: Stomach is within normal limits.   No evidence of bowel obstruction.   Normal appendix (series 2/image 63).   No colonic wall thickening or inflammatory changes.   Vascular/Lymphatic: No evidence of abdominal aortic aneurysm.   Circumaortic left renal vein.   No suspicious abdominopelvic lymphadenopathy.   Reproductive: Mildly heterogeneous uterus.   Bilateral ovaries are within normal limits.   Other: No abdominopelvic ascites.   Musculoskeletal: Visualized osseous structures are within  normal limits.   IMPRESSION: Negative CT abdomen/pelvis.  RUQ sono 02/22/2019: FINDINGS: Gallbladder:   No gallstones or wall thickening visualized. There is no pericholecystic fluid. No sonographic Murphy sign noted by sonographer.   Common bile duct:   Diameter: 2 mm. No intrahepatic or extrahepatic biliary duct dilatation.   Liver:   There is a cyst in the anterior segment right lobe of the liver measuring 7 x 7 x 7 mm. Within normal limits in parenchymal echogenicity. Portal vein is patent on color Doppler imaging with normal direction of blood flow towards the liver.   IMPRESSION: Subcentimeter cyst in right lobe of liver. Study otherwise unremarkable.   PAST GI PROCEDURES:  EGD 01/04/2019: - Esophagogastric landmarks identified.  - 2 cm hiatal hernia.  -  LA Grade A reflux esophagitis.  - Normal esophagus otherwise, biopsies taken to rule out EoE  - Mild Gastritis. Biopsied.  - Normal duodenal bulb and second portion of the duodenum.  1. Surgical [P], gastric antrum and gastric body - MILD REACTIVE CHANGES AND CHRONIC INFLAMMATION. [place] Lights IS NEGATIVE FOR HELICOBACTER PYLORI. - NO INTESTINAL METAPLASIA, DYSPLASIA, OR MALIGNANCY. 2. Surgical [P], mid esophagus and distal esophagus - BENIGN SQUAMOUS MUCOSA. - NO INCREASE IN INTRAEPITHELIAL EOSINOPHILS. - NO INTESTINAL METAPLASIA, DYSPLASIA, OR MALIGNANCY.   EGD in October 2003 by Dr. Dortha Kern: showed debris in the fundus and the mid esophagus with some reflux.   Past Medical History:  Diagnosis Date   Benign heart murmur    Constipation    Depression    Diabetes mellitus without complication (HCC)    GERD (gastroesophageal reflux disease)    Peptic ulcer    Ulcer    Past Surgical History:  Procedure Laterality Date   MYRINGOTOMY     with tubes   UPPER GASTROINTESTINAL ENDOSCOPY     WISDOM TOOTH EXTRACTION     Social History: She is married. She has one son and two daughters. She is an  Airline pilot. No alcohol use. Past smoker, quit smoking cigarettes 10 years ago. She smokes marijuana daily 10 years.   Family History: Father with history of heart disease, diagnosed with colon cancer at the age 52, had recurrence of colon cancer with lung and liver metastasis, died age 38. Mother with history of colon polyps.    Allergies  Allergen Reactions   Augmentin [Amoxicillin-Pot Clavulanate] Anaphylaxis, Shortness Of Breath and Swelling    Has patient had a PCN reaction causing immediate rash, facial/tongue/throat swelling, SOB or lightheadedness with hypotension: Yes Has patient had a PCN reaction causing severe rash involving mucus membranes or skin necrosis: Yes Has patient had a PCN reaction that required hospitalization No Has patient had a PCN reaction occurring within the last 10 years: Yes If all of the above answers are "NO", then may proceed with Cephalosporin use.    Sulfa Antibiotics Anaphylaxis, Shortness Of Breath and Swelling   Amoxicillin Itching, Nausea Only and Rash      Outpatient Encounter Medications as of 03/11/2023  Medication Sig   phentermine 37.5 MG capsule Take 37.5 mg by mouth every morning.   triamcinolone cream (KENALOG) 0.1 % APPLY A THIN LAYER OF CREAM TO AFFECTED AREA(S) TWICE DAILY   Vitamin D, Ergocalciferol, (DRISDOL) 1.25 MG (50000 UNIT) CAPS capsule Take 50,000 Units by mouth 2 (two) times a week.   No facility-administered encounter medications on file as of 03/11/2023.    REVIEW OF SYSTEMS:  Gen: + Fatigue. Denies fever, sweats or chills. No weight loss.  CV: See HPI. No current CP or palpitations.  Resp: See HPI. No current SOB. No hemoptysis.   GI: See HPI. GU : Denies urinary burning, blood in urine, increased urinary frequency or incontinence. MS: + Left foot injury, wearing an orthopedic boot.  Derm: Denies rash, itchiness, skin lesions or unhealing ulcers. Psych: + Anxiety and depression. Heme: Denies bruising, easy  bleeding. Neuro:  Denies headaches, dizziness or paresthesias. Endo:  Denies any problems with DM, thyroid or adrenal function.  PHYSICAL EXAM: BP 110/76   Pulse (!) 103   Ht 5\' 7"  (1.702 m)   Wt 285 lb (129.3 kg)   BMI 44.64 kg/m  Patient denies chance of pregnancy. LMP 1 week ago.   General: 37 year old female, tearful, in no acute  distress. Head: Normocephalic and atraumatic. Eyes:  Sclerae non-icteric, conjunctive pink. Ears: Normal auditory acuity. Mouth: Dentition intact. No ulcers or lesions.  Neck: Supple, no lymphadenopathy or thyromegaly.  Lungs: Clear bilaterally to auscultation without wheezes, crackles or rhonchi. Heart: Regular rate and rhythm. No murmur, rub or gallop appreciated.  Abdomen: Soft, distended. Moderate lower abdominal tenderness without rebound or guarding, patient tearful following exam but not in distress. No masses. No hepatosplenomegaly. Normoactive bowel sounds x 4 quadrants.  Rectal: Deferred.  Musculoskeletal: Symmetrical with no gross deformities. Skin: Warm and dry. No rash or lesions on visible extremities. Extremities: No edema. Left foot in orthopedic boot.  Neurological: Alert oriented x 4, no focal deficits.  Psychological:  Alert and cooperative. Normal mood and affect.  ASSESSMENT AND PLAN:  37 year old female with chronic intermittent episodes of N/V, lower abdominal pain with associated constipation and fatigue. One episode on 01/2023 included syncope without recurrence, likely vasovagal response. CTAP 01/2019 for similar symptoms was unrevealing. Evaluated by cardiology, ECHO, brief Zio patch monitor and cardiac PET without cardiac abnormality.  -CBC, CMP, TSH and CRP -CTAP with oral and IV contrast -Diagnostic colonoscopy benefits and risks discussed including risk with sedation, risk of bleeding, perforation and infection  -Patient to hold Phentermine for 10 days prior to colonoscopy date  -Miralax once or twice daily as tolerated   -Ibgard one bid for abdominal pain  -Famotidine 20mg  po bid -Patient to contact office if symptoms worsen   Colon cancer screening. Patient reported mother had colon polyps and father had colon cancer diagnosed at the age of 32. -Colonoscopy as ordered above   History of GERD. Remote history of H. Pylori. EGD 12/2018 showed a 2 cm hiatal hernia and grade A reflux esophagitis and mild gastritis without evidence of H.Pylori.  -Famotidine 20mg  po bid         CC:  Claiborne Rigg, NP

## 2023-03-13 DIAGNOSIS — K5900 Constipation, unspecified: Secondary | ICD-10-CM | POA: Insufficient documentation

## 2023-03-13 MED ORDER — FAMOTIDINE 20 MG PO TABS
20.0000 mg | ORAL_TABLET | Freq: Two times a day (BID) | ORAL | 1 refills | Status: DC
Start: 2023-03-13 — End: 2023-05-07

## 2023-03-14 ENCOUNTER — Telehealth: Payer: Self-pay

## 2023-03-14 ENCOUNTER — Ambulatory Visit (HOSPITAL_COMMUNITY)
Admission: RE | Admit: 2023-03-14 | Discharge: 2023-03-14 | Disposition: A | Discharge status: Home / Self Care | Payer: 59 | Source: Ambulatory Visit | Attending: Nurse Practitioner | Admitting: Nurse Practitioner

## 2023-03-14 DIAGNOSIS — K5900 Constipation, unspecified: Secondary | ICD-10-CM | POA: Diagnosis present

## 2023-03-14 DIAGNOSIS — R1030 Lower abdominal pain, unspecified: Secondary | ICD-10-CM | POA: Insufficient documentation

## 2023-03-14 MED ORDER — IOHEXOL 350 MG/ML SOLN
75.0000 mL | Freq: Once | INTRAVENOUS | Status: AC | PRN
  Administered 2023-03-14: 75 mL via INTRAVENOUS

## 2023-03-14 NOTE — Telephone Encounter (Signed)
Contacted pt & pt verbalized understanding that her CT was changed to a STAT CT & was scheduled for today at 4:00

## 2023-03-14 NOTE — Progress Notes (Signed)
Agree with assessment and plan as outlined.  

## 2023-03-21 ENCOUNTER — Telehealth: Payer: Self-pay

## 2023-03-21 NOTE — Telephone Encounter (Signed)
Contact pt & pt verbalized understanding of CT results.

## 2023-03-29 ENCOUNTER — Other Ambulatory Visit (HOSPITAL_COMMUNITY): Payer: 59

## 2023-04-08 ENCOUNTER — Telehealth: Payer: Self-pay

## 2023-04-08 NOTE — Telephone Encounter (Signed)
Contacted pt & pt verbalized understanding to hold Phentermine for 10 days prior to your procedure.

## 2023-04-21 ENCOUNTER — Encounter: Payer: Self-pay | Admitting: Gastroenterology

## 2023-04-21 ENCOUNTER — Ambulatory Visit (AMBULATORY_SURGERY_CENTER): Payer: 59 | Admitting: Gastroenterology

## 2023-04-21 VITALS — BP 113/60 | HR 85 | Temp 98.2°F | Resp 18 | Ht 67.0 in | Wt 285.0 lb

## 2023-04-21 DIAGNOSIS — Z800 Family history of malignant neoplasm of digestive organs: Secondary | ICD-10-CM

## 2023-04-21 DIAGNOSIS — R1030 Lower abdominal pain, unspecified: Secondary | ICD-10-CM

## 2023-04-21 DIAGNOSIS — D122 Benign neoplasm of ascending colon: Secondary | ICD-10-CM

## 2023-04-21 MED ORDER — AMITIZA 8 MCG PO CAPS: 8.0000 ug | ORAL_CAPSULE | Freq: Two times a day (BID) | ORAL | 1 refills | Status: AC

## 2023-04-21 MED ORDER — SODIUM CHLORIDE 0.9 % IV SOLN
500.0000 mL | INTRAVENOUS | Status: AC
Start: 2023-04-21 — End: ?

## 2023-04-21 NOTE — Op Note (Signed)
[facility] Patient Name: Tanya Terry Procedure Date: 04/21/2023 1:48 PM MRN: [id number] Endoscopist: Viviann Spare P. Adela Lank , MD, [id number] Age: 37 Referring MD:  Date of Birth: 02/05/1986 Gender: Female Account #: [id number] Procedure:                Colonoscopy Indications:              Lower abdominal pain, chronic constipation, father                            with colon cancer dx age 89s. CT scan negative.                            Using OTC regimens for constipation, Linzess                            previously too strong Medicines:                Monitored Anesthesia Care Procedure:                Pre-Anesthesia Assessment:                           - Prior to the procedure, a History and Physical                            was performed, and patient medications and                            allergies were reviewed. The patient's tolerance of                            previous anesthesia was also reviewed. The risks                            and benefits of the procedure and the sedation                            options and risks were discussed with the patient.                            All questions were answered, and informed consent                            was obtained. Prior Anticoagulants: The patient has                            taken no anticoagulant or antiplatelet agents. ASA                            Grade Assessment: III - A patient with severe                            systemic disease. After reviewing the risks and  benefits, the patient was deemed in satisfactory                            condition to undergo the procedure.                           After obtaining informed consent, the colonoscope                            was passed under direct vision. Throughout the                            procedure, the patient's blood pressure, pulse, and                            oxygen saturations were monitored  continuously. The                            CF [id number] #[id number] was introduced through the anus                            and advanced to the the terminal ileum, with                            identification of the appendiceal orifice and IC                            valve. The colonoscopy was performed without                            difficulty. The patient tolerated the procedure                            well. The quality of the bowel preparation was                            good. The terminal ileum, ileocecal valve,                            appendiceal orifice, and rectum were photographed. Scope In: 1:56:28 PM Scope Out: 2:07:42 PM Scope Withdrawal Time: 0 hours 8 minutes 37 seconds  Total Procedure Duration: 0 hours 11 minutes 14 seconds  Findings:                 The perianal and digital rectal examinations were                            normal.                           The terminal ileum appeared normal.                           A 3 to 4 mm polyp was found in the ascending colon.  The polyp was sessile. The polyp was removed with a                            cold snare. Resection and retrieval were complete.                           Internal hemorrhoids were found during retroflexion.                           The exam was otherwise without abnormality. Complications:            No immediate complications. Estimated blood loss:                            Minimal. Estimated Blood Loss:     Estimated blood loss was minimal. Impression:               - The examined portion of the ileum was normal.                           - One 3 to 4 mm polyp in the ascending colon,                            removed with a cold snare. Resected and retrieved.                           - Internal hemorrhoids.                           - The examination was otherwise normal.                           No cause for pain on this exam, which could be                             related to bowel spasm.                           - The GI Genius (intelligent endoscopy module),                            computer-aided polyp detection system powered by AI                            was utilized to detect colorectal polyps through                            enhanced visualization during colonoscopy. Recommendation:           - Patient has a contact number available for                            emergencies. The signs and symptoms of potential  delayed complications were discussed with the                            patient. Return to normal activities tomorrow.                            Written discharge instructions were provided to the                            patient.                           - Resume previous diet.                           - Continue present medications.                           - Consideration for low dose Amitiza for                            constipation if Linzess was too strong                           - If pain persists, consider adding neuromodulator                            such as Cymbalta, etc,                           - Await pathology results. Anticipate repeat                            colonoscopy in 5 years given strong family history                            of colon cancer Deforrest Bogle P. Karolyne Timmons, MD 04/21/2023 2:14:06 PM This report has been signed electronically.

## 2023-04-21 NOTE — Progress Notes (Signed)
[facility] History and Physical   Primary Care Physician:  Diamantina Providence, FNP   Reason for Procedure:   Abdominal pain, family history of colon cancer  Plan:    colonoscopy     HPI: Tanya Terry is a 37 y.o. female  here for colonoscopy to evaluate issues as outlined. Intermittent abdominal pain, history of constipation, father had CRc dx age 43s. CT scan normal. Treated with IB gard PRN. She does get some benefit with a bowel movement.    Otherwise feels well without any cardiopulmonary symptoms.   I have discussed risks / benefits of anesthesia and endoscopic procedure with Zoe Lan and they wish to proceed with the exams as outlined today.    Past Medical History:  Diagnosis Date   Benign heart murmur    Constipation    Depression    Diabetes mellitus without complication (HCC)    GERD (gastroesophageal reflux disease)    Peptic ulcer    Ulcer     Past Surgical History:  Procedure Laterality Date   MYRINGOTOMY     with tubes   UPPER GASTROINTESTINAL ENDOSCOPY     WISDOM TOOTH EXTRACTION      Prior to Admission medications   Medication Sig Start Date End Date Taking? Authorizing Provider  clobetasol cream (TEMOVATE) 0.05 % Apply 1 Application topically daily. 03/30/23  Yes [provider]  famotidine (PEPCID) 20 MG tablet Take 1 tablet (20 mg total) by mouth 2 (two) times daily. 03/13/23  Yes Arnaldo Natal, NP  triamcinolone cream (KENALOG) 0.1 % APPLY A THIN LAYER OF CREAM TO AFFECTED AREA(S) TWICE DAILY   Yes [provider]  Vitamin D, Ergocalciferol, (DRISDOL) 1.25 MG (50000 UNIT) CAPS capsule Take 50,000 Units by mouth 2 (two) times a week. 12/21/22  Yes [provider]  phentermine 37.5 MG capsule Take 37.5 mg by mouth every morning.    [provider]    Current Outpatient Medications  Medication Sig Dispense Refill   clobetasol cream (TEMOVATE) 0.05 % Apply 1 Application topically  daily.     famotidine (PEPCID) 20 MG tablet Take 1 tablet (20 mg total) by mouth 2 (two) times daily. 60 tablet 1   triamcinolone cream (KENALOG) 0.1 % APPLY A THIN LAYER OF CREAM TO AFFECTED AREA(S) TWICE DAILY     Vitamin D, Ergocalciferol, (DRISDOL) 1.25 MG (50000 UNIT) CAPS capsule Take 50,000 Units by mouth 2 (two) times a week.     phentermine 37.5 MG capsule Take 37.5 mg by mouth every morning.     Current Facility-Administered Medications  Medication Dose Route Frequency Provider Last Rate Last Admin   0.9 %  sodium chloride infusion  500 mL Intravenous Continuous Minnie Shi, Willaim Rayas, MD        Allergies as of 04/21/2023 - Review Complete 04/21/2023  Allergen Reaction Noted   Augmentin [amoxicillin-pot clavulanate] Anaphylaxis, Shortness Of Breath, and Swelling 06/02/2011   Sulfa antibiotics Anaphylaxis, Shortness Of Breath, and Swelling 04/22/2011   Amoxicillin Itching, Nausea Only, and Rash 03/07/2023    Family History  Problem Relation Age of Onset   Diabetes Mother    Hypertension Mother    Heart disease Father    Hypertension Father    Anesthesia problems Neg Hx    Hypotension Neg Hx    Malignant hyperthermia Neg Hx    Pseudochol deficiency Neg Hx    Colon cancer Neg Hx     Social History   Socioeconomic History   Marital  status: Married    Spouse name: Not on file   Number of children: 3   Years of education: Not on file   Highest education level: Not on file  Occupational History   Occupation: accounting  Tobacco Use   Smoking status: Former    Types: Cigarettes    Quit date: 10/04/2012    Years since quitting: 10.5   Smokeless tobacco: Never  Vaping Use   Vaping Use: Never used  Substance and Sexual Activity   Alcohol use: Yes    Comment: ocassinally   Drug use: Yes    Types: Marijuana   Sexual activity: Yes    Partners: Male    Birth control/protection: None  Other Topics Concern   Not on file  Social History Narrative   Not on file    Social Determinants of Health   Financial Resource Strain: Not on file  Food Insecurity: Not on file  Transportation Needs: Not on file  Physical Activity: Not on file  Stress: Not on file  Social Connections: Not on file  Intimate Partner Violence: Not on file    Review of Systems: All other review of systems negative except as mentioned in the HPI.  Physical Exam: Vital signs BP (!) 93/48   Pulse 76   Temp 98.2 F (36.8 C) (Temporal)   Ht 5\' 7"  (1.702 m)   Wt 285 lb (129.3 kg)   SpO2 100%   BMI 44.64 kg/m   General:   Alert,  Well-developed, pleasant and cooperative in NAD Lungs:  Clear throughout to auscultation.   Heart:  Regular rate and rhythm Abdomen:  Soft, nontender and nondistended.   Neuro/Psych:  Alert and cooperative. Normal mood and affect. A and O x 3  Harlin Rain, MD [facility]

## 2023-04-21 NOTE — Progress Notes (Signed)
Report to PACU, RN, vss, BBS= Clear.  

## 2023-04-21 NOTE — Patient Instructions (Addendum)
-   Resume previous diet. - Continue present medications. - Consideration for low dose Amitiza for constipation if Linzess was too strong - If pain persists, consider adding neuromodulator such as Cymbalta, etc, - Await pathology results. Anticipate repeat colonoscopy in 5 years given strong family history of colon cancer  YOU HAD AN ENDOSCOPIC PROCEDURE TODAY AT THE [facility]:   Refer to the procedure report that was given to you for any specific questions about what was found during the examination.  If the procedure report does not answer your questions, please call your gastroenterologist to clarify.  If you requested that your care partner not be given the details of your procedure findings, then the procedure report has been included in a sealed envelope for you to review at your convenience later.  YOU SHOULD EXPECT: Some feelings of bloating in the abdomen. Passage of more gas than usual.  Walking can help get rid of the air that was put into your GI tract during the procedure and reduce the bloating. If you had a lower endoscopy (such as a colonoscopy or flexible sigmoidoscopy) you may notice spotting of blood in your stool or on the toilet paper. If you underwent a bowel prep for your procedure, you may not have a normal bowel movement for a few days.  Please Note:  You might notice some irritation and congestion in your nose or some drainage.  This is from the oxygen used during your procedure.  There is no need for concern and it should clear up in a day or so.  SYMPTOMS TO REPORT IMMEDIATELY:  Following lower endoscopy (colonoscopy or flexible sigmoidoscopy):  Excessive amounts of blood in the stool  Significant tenderness or worsening of abdominal pains  Swelling of the abdomen that is new, acute  Fever of 100F or higher  For urgent or emergent issues, a gastroenterologist can be reached at any hour by calling [phone]. Do not use MyChart messaging for urgent  concerns.    DIET:  We do recommend a small meal at first, but then you may proceed to your regular diet.  Drink plenty of fluids but you should avoid alcoholic beverages for 24 hours.  ACTIVITY:  You should plan to take it easy for the rest of today and you should NOT DRIVE or use heavy machinery until tomorrow (because of the sedation medicines used during the test).    FOLLOW UP: Our staff will call the number listed on your records the next business day following your procedure.  We will call around 7:15- 8:00 am to check on you and address any questions or concerns that you may have regarding the information given to you following your procedure. If we do not reach you, we will leave a message.     If any biopsies were taken you will be contacted by phone or by letter within the next 1-3 weeks.  Please call us at [phone] if you have not heard about the biopsies in 3 weeks.    SIGNATURES/CONFIDENTIALITY: You and/or your care partner have signed paperwork which will be entered into your electronic medical record.  These signatures attest to the fact that that the information above on your After Visit Summary has been reviewed and is understood.  Full responsibility of the confidentiality of this discharge information lies with you and/or your care-partner.

## 2023-04-21 NOTE — Progress Notes (Signed)
Pt's states no medical or surgical changes since previsit or office visit. 

## 2023-04-22 ENCOUNTER — Telehealth: Payer: Self-pay | Admitting: *Deleted

## 2023-04-22 NOTE — Telephone Encounter (Signed)
  Follow up Call-     04/21/2023    1:33 PM  Call back number  Post procedure Call Back phone  # [phone]  Permission to leave phone message Yes     Patient questions:  Message left to call if necessaray.

## 2023-05-02 ENCOUNTER — Encounter: Payer: Self-pay | Admitting: Gastroenterology

## 2023-05-07 ENCOUNTER — Other Ambulatory Visit: Payer: Self-pay | Admitting: Nurse Practitioner

## 2023-05-09 NOTE — Progress Notes (Signed)
Cardiology Office Note:    Date:  05/10/2023   ID:  Tanya Terry, DOB May 17, 1986, MRN [id number]  PCP:  Diamantina Providence, FNP  Cardiologist:  Little Ishikawa, MD  Electrophysiologist:  None   Referring MD: Claiborne Rigg, NP   Chief Complaint  Patient presents with   Loss of Consciousness    History of Present Illness:    Tanya Terry is a 37 y.o. female with a hx of GERD, PUD, diabetes who presents for follow-up.  He was referred by Bertram Denver, NP for evaluation of syncope, initially seen 01/11/2023.  She reports frequent episodes of syncope.  States that it occurs about 1-2 times per month.  Reports with each episodes she starts to feel nauseated, has abdominal discomfort, then starts to get tunnel vision and see spots, then will pass out.  Notices episodes seems to correlate with when she has a bowel movement.  Also reports has been having shortness of breath with minimal exertion.  States that just walking short distances she will feel short of breath.  Also reports chest tightness with minimal exertion.  Reports also having palpitations that occur during syncopal episodes.  She smoked for 10 years about 5 to 6 cigarettes/day.  Family's includes father has had multiple MIs, first at age 36 and has heart failure; mother has heart failure.  Echocardiogram 01/31/2023 showed normal biventricular function, no significant valvular disease.  Stress PET 02/02/2023 showed normal perfusion, EF 61%, normal myocardial blood flow reserve, no coronary calcifications.  Zio patch 01/2023 was only worn for 1.5 days, but showed no significant arrhythmias.  Since last clinic visit, she reports she is doing better.  Reports she saw GI and her GI symptoms have improved and she has had no more syncopal episodes.  Denies any shortness of breath, does report she continues to have intermittent chest pain.  She walks 3 times a week for 30 minutes but still recovering from ankle injury.  Past  Medical History:  Diagnosis Date   Benign heart murmur    Constipation    Depression    Diabetes mellitus without complication (HCC)    GERD (gastroesophageal reflux disease)    Peptic ulcer    Ulcer     Past Surgical History:  Procedure Laterality Date   MYRINGOTOMY     with tubes   UPPER GASTROINTESTINAL ENDOSCOPY     WISDOM TOOTH EXTRACTION      Current Medications: Current Meds  Medication Sig   AMITIZA 8 MCG capsule Take 1 capsule (8 mcg total) by mouth 2 (two) times daily with a meal.   clobetasol cream (TEMOVATE) 0.05 % Apply 1 Application topically daily.   famotidine (PEPCID) 20 MG tablet Take 1 tablet by mouth twice daily   phentermine 37.5 MG capsule Take 37.5 mg by mouth every morning.   triamcinolone cream (KENALOG) 0.1 % APPLY A THIN LAYER OF CREAM TO AFFECTED AREA(S) TWICE DAILY   Vitamin D, Ergocalciferol, (DRISDOL) 1.25 MG (50000 UNIT) CAPS capsule Take 50,000 Units by mouth 2 (two) times a week.   Current Facility-Administered Medications for the 05/10/23 encounter (Office Visit) with Little Ishikawa, MD  Medication   0.9 %  sodium chloride infusion     Allergies:   Augmentin [amoxicillin-pot clavulanate], Sulfa antibiotics, and Amoxicillin   Social History   Socioeconomic History   Marital status: Married    Spouse name: Not on file   Number of children: 3   Years of education: Not on  file   Highest education level: Not on file  Occupational History   Occupation: accounting  Tobacco Use   Smoking status: Former    Types: Cigarettes    Quit date: 10/04/2012    Years since quitting: 10.6   Smokeless tobacco: Never  Vaping Use   Vaping Use: Never used  Substance and Sexual Activity   Alcohol use: Yes    Comment: ocassinally   Drug use: Yes    Types: Marijuana   Sexual activity: Yes    Partners: Male    Birth control/protection: None  Other Topics Concern   Not on file  Social History Narrative   Not on file   Social Determinants  of Health   Financial Resource Strain: Not on file  Food Insecurity: Not on file  Transportation Needs: Not on file  Physical Activity: Not on file  Stress: Not on file  Social Connections: Not on file     Family History: The patient's family history includes Diabetes in her mother; Heart disease in her father; Hypertension in her father and mother. There is no history of Anesthesia problems, Hypotension, Malignant hyperthermia, Pseudochol deficiency, or Colon cancer.  ROS:   Please see the history of present illness.     All other systems reviewed and are negative.  EKGs/Labs/Other Studies Reviewed:    The following studies were reviewed today:   EKG:   01/11/2023: Normal sinus rhythm, rate 100, nonspecific T wave flattening, QTc 464  Recent Labs: 03/11/2023: ALT 14; BUN 10; Creatinine, Ser 0.86; Hemoglobin 14.6; Platelets 299.0; Potassium 4.1; Sodium 140; TSH 1.86  Recent Lipid Panel No results found for: "CHOL", "TRIG", "HDL", "CHOLHDL", "VLDL", "LDLCALC", "LDLDIRECT"  Physical Exam:    VS:  BP (!) 128/96   Pulse (!) 109   Ht 5\' 8"  (1.727 m)   Wt 275 lb 12.8 oz (125.1 kg)   SpO2 98%   BMI 41.94 kg/m     Wt Readings from Last 3 Encounters:  05/10/23 275 lb 12.8 oz (125.1 kg)  04/21/23 285 lb (129.3 kg)  03/11/23 285 lb (129.3 kg)     GEN:  Well nourished, well developed in no acute distress HEENT: Normal NECK: No JVD; No carotid bruits LYMPHATICS: No lymphadenopathy CARDIAC: RRR, no murmurs, rubs, gallops RESPIRATORY:  Clear to auscultation without rales, wheezing or rhonchi  ABDOMEN: Soft, non-tender, non-distended MUSCULOSKELETAL:  No edema; No deformity  SKIN: Warm and dry NEUROLOGIC:  Alert and oriented x 3 PSYCHIATRIC:  Normal affect   ASSESSMENT:    1. Syncope, unspecified syncope type   2. Morbid obesity (HCC)   3. Daytime somnolence   4. Chest pain of uncertain etiology     PLAN:    Syncope: Description suggests vasovagal syncope given  prodromal symptoms.  Seems to be triggered by bowel movements, which she reports she has rarely due to issues with constipation.  Referred to GI  Also discussed if having prodromal symptoms to lie on the ground until symptoms pass.  Echocardiogram 01/31/2023 showed normal biventricular function, no significant valvular disease.  Stress PET 02/02/2023 showed normal perfusion, EF 61%, normal myocardial blood flow reserve, no coronary calcifications.  Zio patch 01/2023 was only worn for 1.5 days, but showed no significant arrhythmias. -She has seen GI and reports improvement in her GI symptoms, no syncopal episodes recently.  Chest pain/DOE: She is reporting exertional chest tightness and shortness of breath, concerning for angina.  Despite age, does have CAD risk factors (smoking history, family history).  Not  a good coronary CTA candidate given BMI and significantly elevated heart rate.  Stress PET 02/02/2023 showed normal perfusion, EF 61%, normal myocardial blood flow reserve, no coronary calcifications. -No further cardiac workup recommended  Snoring/daytime somnolence: Check Itamar sleep study.  STOP-BANG 3  Morbid obesity: Body mass index is 41.94 kg/m.  Will refer to healthy weight and wellness  RTC in 1 year   Medication Adjustments/Labs and Tests Ordered: Current medicines are reviewed at length with the patient today.  Concerns regarding medicines are outlined above.  Orders Placed This Encounter  Procedures   Amb Ref to Medical Weight Management   Itamar Sleep Study   No orders of the defined types were placed in this encounter.   Patient Instructions  Medication Instructions:  Your physician recommends that you continue on your current medications as directed. Please refer to the Current Medication list given to you today.  *If you need a refill on your cardiac medications before your next appointment, please call your pharmacy*   Lab Work: None   Testing/Procedures: Your  physician has recommended that you have a sleep study. This test records several body functions during sleep, including: brain activity, eye movement, oxygen and carbon dioxide blood levels, heart rate and rhythm, breathing rate and rhythm, the flow of air through your mouth and nose, snoring, body muscle movements, and chest and belly movement.   Follow-Up: At [facility], you and your health needs are our priority.  As part of our continuing mission to provide you with exceptional heart care, we have created designated Provider Care Teams.  These Care Teams include your primary Cardiologist (physician) and Advanced Practice Providers (APPs -  Physician Assistants and Nurse Practitioners) who all work together to provide you with the care you need, when you need it.    Your next appointment:   1 year(s)  Provider:   Little Ishikawa, MD     Signed, Little Ishikawa, MD  05/10/2023 12:08 PM    [facility]

## 2023-05-10 ENCOUNTER — Ambulatory Visit: Payer: Managed Care, Other (non HMO) | Attending: Cardiology | Admitting: Cardiology

## 2023-05-10 ENCOUNTER — Encounter: Payer: Self-pay | Admitting: Cardiology

## 2023-05-10 VITALS — BP 128/96 | HR 109 | Ht 68.0 in | Wt 275.8 lb

## 2023-05-10 DIAGNOSIS — R55 Syncope and collapse: Secondary | ICD-10-CM | POA: Diagnosis not present

## 2023-05-10 DIAGNOSIS — R400 Somnolence: Secondary | ICD-10-CM | POA: Diagnosis not present

## 2023-05-10 DIAGNOSIS — R079 Chest pain, unspecified: Secondary | ICD-10-CM

## 2023-05-10 NOTE — Patient Instructions (Addendum)
Medication Instructions:  Your physician recommends that you continue on your current medications as directed. Please refer to the Current Medication list given to you today.  *If you need a refill on your cardiac medications before your next appointment, please call your pharmacy*   Lab Work: None   Testing/Procedures: Your physician has recommended that you have a sleep study. This test records several body functions during sleep, including: brain activity, eye movement, oxygen and carbon dioxide blood levels, heart rate and rhythm, breathing rate and rhythm, the flow of air through your mouth and nose, snoring, body muscle movements, and chest and belly movement.   Follow-Up: At [facility], you and your health needs are our priority.  As part of our continuing mission to provide you with exceptional heart care, we have created designated Provider Care Teams.  These Care Teams include your primary Cardiologist (physician) and Advanced Practice Providers (APPs -  Physician Assistants and Nurse Practitioners) who all work together to provide you with the care you need, when you need it.    Your next appointment:   1 year(s)  Provider:   Little Ishikawa, MD

## 2023-05-18 ENCOUNTER — Other Ambulatory Visit (HOSPITAL_COMMUNITY): Payer: Self-pay

## 2023-05-19 ENCOUNTER — Telehealth: Payer: Self-pay

## 2023-05-19 ENCOUNTER — Other Ambulatory Visit (HOSPITAL_COMMUNITY): Payer: Self-pay

## 2023-05-19 NOTE — Telephone Encounter (Signed)
Pharmacy Patient Advocate Encounter  Received notification from [facility] that Prior Authorization for Lubiprostone capsules has been APPROVED from 05/19/2023 to 05/18/2026.Marland Kitchen  PA #/Case ID/Reference #: [id number]

## 2023-06-04 ENCOUNTER — Other Ambulatory Visit: Payer: Self-pay | Admitting: Nurse Practitioner

## 2023-06-21 ENCOUNTER — Other Ambulatory Visit: Payer: Self-pay | Admitting: Nurse Practitioner

## 2023-08-06 ENCOUNTER — Other Ambulatory Visit: Payer: Self-pay | Admitting: Nurse Practitioner

## 2023-11-09 ENCOUNTER — Other Ambulatory Visit: Payer: Self-pay | Admitting: Nurse Practitioner

## 2023-11-22 ENCOUNTER — Encounter (INDEPENDENT_AMBULATORY_CARE_PROVIDER_SITE_OTHER): Payer: Self-pay | Admitting: Family Medicine

## 2023-11-30 ENCOUNTER — Encounter (INDEPENDENT_AMBULATORY_CARE_PROVIDER_SITE_OTHER): Payer: Medicaid Other | Admitting: Physician Assistant

## 2024-01-18 ENCOUNTER — Encounter (INDEPENDENT_AMBULATORY_CARE_PROVIDER_SITE_OTHER): Payer: Self-pay

## 2024-08-10 ENCOUNTER — Other Ambulatory Visit: Payer: Self-pay

## 2024-08-10 ENCOUNTER — Encounter (HOSPITAL_BASED_OUTPATIENT_CLINIC_OR_DEPARTMENT_OTHER): Payer: Self-pay | Admitting: Orthopaedic Surgery

## 2024-08-10 ENCOUNTER — Encounter (HOSPITAL_BASED_OUTPATIENT_CLINIC_OR_DEPARTMENT_OTHER)
Admission: RE | Admit: 2024-08-10 | Discharge: 2024-08-10 | Disposition: A | Discharge status: Home / Self Care | Source: Ambulatory Visit | Attending: Orthopaedic Surgery | Admitting: Orthopaedic Surgery

## 2024-08-10 DIAGNOSIS — Z01812 Encounter for preprocedural laboratory examination: Secondary | ICD-10-CM | POA: Diagnosis present

## 2024-08-10 LAB — SURGICAL PCR SCREEN
MRSA, PCR: NEGATIVE
Staphylococcus aureus: NEGATIVE

## 2024-08-10 NOTE — Progress Notes (Signed)
 Surgical soap and BPO given to patient with instructions for use.  Patient verbalized understanding of instructions.

## 2024-08-15 NOTE — Anesthesia Preprocedure Evaluation (Signed)
 Anesthesia Evaluation  Patient identified by MRN, date of birth, ID band Patient awake    Reviewed: Allergy & Precautions, H&P , NPO status , Patient's Chart, lab work & pertinent test results  History of Anesthesia Complications (+) history of anesthetic complications  Airway Mallampati: II  TM Distance: >3 FB Neck ROM: Full    Dental  (+) Dental Advisory Given   Pulmonary former smoker   Pulmonary exam normal breath sounds clear to auscultation       Cardiovascular negative cardio ROS Normal cardiovascular exam Rhythm:Regular Rate:Normal     Neuro/Psych    Depression    negative neurological ROS  negative psych ROS   GI/Hepatic negative GI ROS, Neg liver ROS,GERD  ,,  Endo/Other  diabetes    Renal/GU negative Renal ROS  negative genitourinary   Musculoskeletal negative musculoskeletal ROS (+)    Abdominal  (+) + obese  Peds negative pediatric ROS (+)  Hematology negative hematology ROS (+)   Anesthesia Other Findings All: augmentin, sulfa, amoxicillin  Reproductive/Obstetrics negative OB ROS                              Anesthesia Physical Anesthesia Plan  ASA: 2  Anesthesia Plan: General   Post-op Pain Management: Regional block*, Tylenol  PO (pre-op)*, Gabapentin PO (pre-op)* and Minimal or no pain anticipated   Induction: Intravenous  PONV Risk Score and Plan: 3 and Ondansetron , Dexamethasone, Midazolam and Treatment may vary due to age or medical condition  Airway Management Planned: Oral ETT and LMA  Additional Equipment:   Intra-op Plan:   Post-operative Plan: Extubation in OR  Informed Consent: I have reviewed the patients History and Physical, chart, labs and discussed the procedure including the risks, benefits and alternatives for the proposed anesthesia with the patient or authorized representative who has indicated his/her understanding and acceptance.      Dental advisory given  Plan Discussed with: CRNA  Anesthesia Plan Comments:          Anesthesia Quick Evaluation

## 2024-08-15 NOTE — Discharge Instructions (Addendum)
 Bonner Hair MD, MPH Aleck Stalling, PA-C [facility] [address], [address] [phone] (tel)   [phone] (fax)   POST-OPERATIVE INSTRUCTIONS  WOUND CARE - Please keep the dressing in the front of your shoulder intact until followup. (The rectangle bandage) - You may shower on Post-Op Day #3.  - The rectangle dressing (front of shoulder) is water resistant but do not scrub it as it may start to peel up.   - You may remove the Operative Dressing on the back of the shoulder (square bandage) on Post-Op Day #3 (72hrs after surgery).   - The square bandage is not water resistant so cannot get wet in the shower - Alternatively if you would like you can leave dressing on until follow-up if within 7-8 days but keep it dry. - Leave steri-strips in place until they fall off on their own, usually 2 weeks postop. - Gently pat the area dry.  - Do not soak the shoulder in water.  - Do not go swimming in the pool or ocean until your incision has completely healed (about 4-6 weeks after surgery) - KEEP THE INCISIONS CLEAN AND DRY.   EXERCISES Wear the sling at all times  You may remove the sling for showering, but keep the arm across the chest or in a secondary sling.     It is normal for your fingers/hand to become more swollen after surgery and discolored from bruising.   This will resolve over the first few weeks usually after surgery. Please continue to ambulate and do not stay sitting or lying for too long.  Perform foot and wrist pumps to assist in circulation.  PHYSICAL THERAPY - You will begin physical therapy soon after surgery (unless otherwise specified) - Please call to set up an appointment, if you do not already have one  - Let our office if there are any issues with scheduling your therapy   - A PT referral was sent to [facility]  REGIONAL ANESTHESIA (NERVE BLOCKS) The anesthesia team may have performed a nerve block for you this is a great  tool used to minimize pain.   The block may start wearing off overnight (between 8-24 hours postop) When the block wears off, your pain may go from nearly zero to the pain you would have had postop without the block. This is an abrupt transition but nothing dangerous is happening.   This can be a challenging period but utilize your as needed pain medications to try and manage this period. We suggest you use the pain medication the first night prior to going to bed, to ease this transition.  You may take an extra dose of narcotic when this happens if needed   POST-OP MEDICATIONS- Multimodal approach to pain control In general your pain will be controlled with a combination of substances.  Prescriptions unless otherwise discussed are electronically sent to your pharmacy.  This is a carefully made plan we use to minimize narcotic use.     Meloxicam - Anti-inflammatory medication taken on a scheduled basis Acetaminophen  - Non-narcotic pain medicine taken on a scheduled basis  Gabapentin - this is to help with nerve based pain, take on a scheduled basis Oxycodone  - This is a strong narcotic, to be used only on an "as needed" basis for SEVERE pain. Zofran  - take as needed for nausea   FOLLOW-UP - If you develop a Fever (>101.5), Redness or Drainage from the surgical incision site, please call our office to  arrange for an evaluation. - Please call the office to schedule a follow-up appointment for your incision check if you do not already have one, 7-10 days post-operatively.  IF YOU HAVE ANY QUESTIONS, PLEASE FEEL FREE TO CALL OUR OFFICE.  HELPFUL INFORMATION  Your arm will be in a sling following surgery. You may walk and ambulate without restrictions.  You may be more comfortable sleeping in a semi-seated position the first few nights following surgery.  Keep a pillow propped under the elbow and forearm for comfort.  If you have a recliner type of chair it might be beneficial.    When  dressing, put your operative arm in the sleeve first.  When getting undressed, take your operative arm out last.  Loose fitting, button-down shirts are recommended.  Often in the first days after surgery you may be more comfortable keeping your operative arm under your shirt and not through the sleeve.  You may return to work/school in the next couple of days when you feel up to it. Desk work and typing in the sling is fine.  We suggest you use the pain medication the first night prior to going to bed, in order to ease any pain when the anesthesia wears off. You should avoid taking pain medications on an empty stomach as it will make you nauseous.  You should wean off your narcotic medicines as soon as you are able.  Most patients will be off narcotics before their first postop appointment. We do not refill narcotics.  Do not drink alcoholic beverages or take illicit drugs when taking pain medications.  In most states it is against the law to drive while your arm is in a sling. And certainly against the law to drive while taking narcotics.  Pain medication may make you constipated.  Below are a few solutions to try in this order: - Decrease the amount of pain medication if you aren't having pain. - Drink lots of decaffeinated fluids. - Drink prune juice and/or each dried prunes  If the first 3 don't work start with additional solutions - Take Colace - an over-the-counter stool softener - Take Senokot - an over-the-counter laxative - Take Miralax  - a stronger over-the-counter laxative  For more information including helpful videos and documents visit our website:   [email or web address]    Post Anesthesia Home Care Instructions  Activity: Get plenty of rest for the remainder of the day. A responsible individual must stay with you for 24 hours following the procedure.  For the next 24 hours, DO NOT: -Drive a car -Advertising copywriter -Drink alcoholic  beverages -Take any medication unless instructed by your physician -Make any legal decisions or sign important papers.  Meals: Start with liquid foods such as gelatin or soup. Progress to regular foods as tolerated. Avoid greasy, spicy, heavy foods. If nausea and/or vomiting occur, drink only clear liquids until the nausea and/or vomiting subsides. Call your physician if vomiting continues.  Special Instructions/Symptoms: Your throat may feel dry or sore from the anesthesia or the breathing tube placed in your throat during surgery. If this causes discomfort, gargle with warm salt water. The discomfort should disappear within 24 hours.  If you had a scopolamine patch placed behind your ear for the management of post- operative nausea and/or vomiting:  1. The medication in the patch is effective for 72 hours, after which it should be removed.  Wrap patch in a tissue and discard in the trash. Wash hands thoroughly with soap and water. 2.  You may remove the patch earlier than 72 hours if you experience unpleasant side effects which may include dry mouth, dizziness or visual disturbances. 3. Avoid touching the patch. Wash your hands with soap and water after contact with the patch.    Regional Anesthesia Blocks  1. You may not be able to move or feel the blocked extremity after a regional anesthetic block. This may last may last from 3-48 hours after placement, but it will go away. The length of time depends on the medication injected and your individual response to the medication. As the nerves start to wake up, you may experience tingling as the movement and feeling returns to your extremity. If the numbness and inability to move your extremity has not gone away after 48 hours, please call your surgeon.   2. The extremity that is blocked will need to be protected until the numbness is gone and the strength has returned. Because you cannot feel it, you will need to take extra care to avoid injury.  Because it may be weak, you may have difficulty moving it or using it. You may not know what position it is in without looking at it while the block is in effect.  3. For blocks in the legs and feet, returning to weight bearing and walking needs to be done carefully. You will need to wait until the numbness is entirely gone and the strength has returned. You should be able to move your leg and foot normally before you try and bear weight or walk. You will need someone to be with you when you first try to ensure you do not fall and possibly risk injury.  4. Bruising and tenderness at the needle site are common side effects and will resolve in a few days.  5. Persistent numbness or new problems with movement should be communicated to the surgeon or the [facility] [phone] [facility] [phone]).  Information for Discharge Teaching: EXPAREL  (bupivacaine  liposome injectable suspension)   Pain relief is important to your recovery. The goal is to control your pain so you can move easier and return to your normal activities as soon as possible after your procedure. Your physician may use several types of medicines to manage pain, swelling, and more.  Your surgeon or anesthesiologist gave you EXPAREL (bupivacaine ) to help control your pain after surgery.  EXPAREL  is a local anesthetic designed to release slowly over an extended period of time to provide pain relief by numbing the tissue around the surgical site. EXPAREL  is designed to release pain medication over time and can control pain for up to 72 hours. Depending on how you respond to EXPAREL , you may require less pain medication during your recovery. EXPAREL  can help reduce or eliminate the need for opioids during the first few days after surgery when pain relief is needed the most. EXPAREL  is not an opioid and is not addictive. It does not cause sleepiness or sedation.   Important! A teal colored band has been  placed on your arm with the date, time and amount of EXPAREL  you have received. Please leave this armband in place for the full 96 hours following administration, and then you may remove the band. If you return to the hospital for any reason within 96 hours following the administration of EXPAREL , the armband provides important information that your health care providers to know, and alerts them that you have received this anesthetic.    Possible side effects of EXPAREL : Temporary  loss of sensation or ability to move in the area where medication was injected. Nausea, vomiting, constipation Rarely, numbness and tingling in your mouth or lips, lightheadedness, or anxiety may occur. Call your doctor right away if you think you may be experiencing any of these sensations, or if you have other questions regarding possible side effects.  Follow all other discharge instructions given to you by your surgeon or nurse. Eat a healthy diet and drink plenty of water or other fluids.  Next dose of tylenol  will be at 12:45pm  Next dose of ibuprofen  if needed will be 2:45pm

## 2024-08-15 NOTE — H&P (Signed)
 PREOPERATIVE H&P  Chief Complaint: Articular cartilage disorder of shoulder, right, Closed dislocation of right acromioclavicular joint  HPI: Tanya Terry is a 38 y.o. female who is scheduled for, Procedure(s): ARTHROSCOPY, SHOULDER WITH DEBRIDEMENT OPEN LATARJET, CORACOID PROCESS TRANSFER, SHOULDER.   Patient has a past medical history significant for GERD, DM.   Patient has had multiple shoulder dislocations. She is unhappy with her shoulder.  Symptoms are rated as moderate to severe, and have been worsening.  This is significantly impairing activities of daily living.    Please see clinic note for further details on this patient's care.    She has elected for surgical management.   Past Medical History:  Diagnosis Date   Benign heart murmur    Complication of anesthesia    Did not go sleep for EGD   Constipation    Depression    Diabetes mellitus without complication (HCC)    Gestational   GERD (gastroesophageal reflux disease)    Peptic ulcer    Ulcer    Past Surgical History:  Procedure Laterality Date   MYRINGOTOMY     with tubes   UPPER GASTROINTESTINAL ENDOSCOPY     WISDOM TOOTH EXTRACTION     Social History   Socioeconomic History   Marital status: Married    Spouse name: Not on file   Number of children: 3   Years of education: Not on file   Highest education level: Not on file  Occupational History   Occupation: accounting  Tobacco Use   Smoking status: Former    Current packs/day: 0.00    Types: Cigarettes    Quit date: 10/04/2012    Years since quitting: 11.8   Smokeless tobacco: Never  Vaping Use   Vaping status: Never Used  Substance and Sexual Activity   Alcohol use: Yes    Comment: ocassinally   Drug use: Yes    Types: Marijuana    Comment: Daily   Sexual activity: Yes    Partners: Male    Birth control/protection: None  Other Topics Concern   Not on file  Social History Narrative   Not on file   Social Drivers of  Health   Financial Resource Strain: Not on file  Food Insecurity: Not on file  Transportation Needs: Not on file  Physical Activity: Not on file  Stress: Not on file  Social Connections: Not on file   Family History  Problem Relation Age of Onset   Diabetes Mother    Hypertension Mother    Heart disease Father    Hypertension Father    Anesthesia problems Neg Hx    Hypotension Neg Hx    Malignant hyperthermia Neg Hx    Pseudochol deficiency Neg Hx    Colon cancer Neg Hx    Allergies  Allergen Reactions   Augmentin [Amoxicillin-Pot Clavulanate] Anaphylaxis, Shortness Of Breath and Swelling    Has patient had a PCN reaction causing immediate rash, facial/tongue/throat swelling, SOB or lightheadedness with hypotension: Yes Has patient had a PCN reaction causing severe rash involving mucus membranes or skin necrosis: Yes Has patient had a PCN reaction that required hospitalization No Has patient had a PCN reaction occurring within the last 10 years: Yes If all of the above answers are NO, then may proceed with Cephalosporin use.    Sulfa Antibiotics Anaphylaxis, Shortness Of Breath and Swelling   Amoxicillin Itching, Nausea Only and Rash   Prior to Admission medications   Medication Sig Start Date End  Date Taking? Authorizing Provider  diazepam  (VALIUM ) 2 MG tablet Take 2 mg by mouth every 6 (six) hours as needed for anxiety.   Yes [provider]  famotidine  (PEPCID ) 20 MG tablet Take 1 tablet by mouth twice daily 11/11/23  Yes Kennedy-Smith, Colleen M, NP  sertraline  (ZOLOFT ) 25 MG tablet Take 25 mg by mouth daily.   Yes [provider]  clobetasol cream (TEMOVATE) 0.05 % Apply 1 Application topically daily. 03/30/23   [provider]  phentermine 37.5 MG capsule Take 37.5 mg by mouth every morning.    [provider]  triamcinolone cream (KENALOG) 0.1 % APPLY A THIN LAYER OF CREAM TO AFFECTED AREA(S) TWICE DAILY    [provider]   Vitamin D , Ergocalciferol , (DRISDOL) 1.25 MG (50000 UNIT) CAPS capsule Take 50,000 Units by mouth 2 (two) times a week. 12/21/22   [provider]    ROS: All other systems have been reviewed and were otherwise negative with the exception of those mentioned in the HPI and as above.  Physical Exam: General: Alert, no acute distress Cardiovascular: No pedal edema Respiratory: No cyanosis, no use of accessory musculature GI: No organomegaly, abdomen is soft and non-tender Skin: No lesions in the area of chief complaint Neurologic: Sensation intact distally Psychiatric: Patient is competent for consent with normal mood and affect Lymphatic: No axillary or cervical lymphadenopathy  MUSCULOSKELETAL:  Right shoulder: active forward elevation to 170 degrees. Positive anterior apprehension. Cuff strength intact.  Imaging: Mri demonstrates critical bone loss of at least 25-30% of the glenoid. Labrum is attritionally gone. No obvious Hill-sachs.   Assessment: Articular cartilage disorder of shoulder, right, Closed dislocation of right acromioclavicular joint  Plan: Plan for Procedure(s): ARTHROSCOPY, SHOULDER WITH DEBRIDEMENT OPEN LATARJET, CORACOID PROCESS TRANSFER, SHOULDER  The risks benefits and alternatives were discussed with the patient including but not limited to the risks of nonoperative treatment, versus surgical intervention including infection, bleeding, nerve injury,  blood clots, cardiopulmonary complications, morbidity, mortality, among others, and they were willing to proceed.   The patient acknowledged the explanation, agreed to proceed with the plan and consent was signed.   Operative Plan: Right shoulder laterjet procedure Discharge Medications: standard DVT Prophylaxis: none Physical Therapy: outpatient PT Special Discharge needs: sling. [address]   Aleck LOISE Stalling, PA-C  08/15/2024 11:21 AM

## 2024-08-16 ENCOUNTER — Other Ambulatory Visit: Payer: Self-pay

## 2024-08-16 ENCOUNTER — Encounter (HOSPITAL_BASED_OUTPATIENT_CLINIC_OR_DEPARTMENT_OTHER)
Admission: RE | Disposition: A | Discharge status: Home / Self Care | Payer: Self-pay | Source: Home / Self Care | Attending: Orthopaedic Surgery

## 2024-08-16 ENCOUNTER — Encounter (HOSPITAL_BASED_OUTPATIENT_CLINIC_OR_DEPARTMENT_OTHER): Payer: Self-pay | Admitting: Orthopaedic Surgery

## 2024-08-16 ENCOUNTER — Ambulatory Visit (HOSPITAL_BASED_OUTPATIENT_CLINIC_OR_DEPARTMENT_OTHER)
Admission: RE | Admit: 2024-08-16 | Discharge: 2024-08-16 | Disposition: A | Discharge status: Home / Self Care | Attending: Orthopaedic Surgery | Admitting: Orthopaedic Surgery

## 2024-08-16 ENCOUNTER — Ambulatory Visit (HOSPITAL_BASED_OUTPATIENT_CLINIC_OR_DEPARTMENT_OTHER): Payer: Self-pay | Admitting: Anesthesiology

## 2024-08-16 ENCOUNTER — Encounter (HOSPITAL_BASED_OUTPATIENT_CLINIC_OR_DEPARTMENT_OTHER): Payer: Self-pay

## 2024-08-16 ENCOUNTER — Ambulatory Visit (HOSPITAL_BASED_OUTPATIENT_CLINIC_OR_DEPARTMENT_OTHER): Admit: 2024-08-16 | Admitting: Orthopaedic Surgery

## 2024-08-16 ENCOUNTER — Ambulatory Visit (HOSPITAL_COMMUNITY)

## 2024-08-16 DIAGNOSIS — Z833 Family history of diabetes mellitus: Secondary | ICD-10-CM | POA: Insufficient documentation

## 2024-08-16 DIAGNOSIS — Z6830 Body mass index (BMI) 30.0-30.9, adult: Secondary | ICD-10-CM | POA: Diagnosis not present

## 2024-08-16 DIAGNOSIS — E669 Obesity, unspecified: Secondary | ICD-10-CM | POA: Insufficient documentation

## 2024-08-16 DIAGNOSIS — Z87891 Personal history of nicotine dependence: Secondary | ICD-10-CM | POA: Insufficient documentation

## 2024-08-16 DIAGNOSIS — Z01818 Encounter for other preprocedural examination: Secondary | ICD-10-CM

## 2024-08-16 DIAGNOSIS — M25311 Other instability, right shoulder: Secondary | ICD-10-CM

## 2024-08-16 DIAGNOSIS — M24111 Other articular cartilage disorders, right shoulder: Secondary | ICD-10-CM | POA: Diagnosis present

## 2024-08-16 DIAGNOSIS — Z79899 Other long term (current) drug therapy: Secondary | ICD-10-CM | POA: Insufficient documentation

## 2024-08-16 DIAGNOSIS — M85811 Other specified disorders of bone density and structure, right shoulder: Secondary | ICD-10-CM | POA: Diagnosis not present

## 2024-08-16 DIAGNOSIS — S43101A Unspecified dislocation of right acromioclavicular joint, initial encounter: Secondary | ICD-10-CM | POA: Diagnosis present

## 2024-08-16 DIAGNOSIS — K219 Gastro-esophageal reflux disease without esophagitis: Secondary | ICD-10-CM | POA: Diagnosis not present

## 2024-08-16 DIAGNOSIS — E119 Type 2 diabetes mellitus without complications: Secondary | ICD-10-CM | POA: Diagnosis not present

## 2024-08-16 HISTORY — DX: Other complications of anesthesia, initial encounter: T88.59XA

## 2024-08-16 HISTORY — PX: OPEN LATARJET, CORACOID PROCESS TRANSFER, SHOULDER: SHX7618

## 2024-08-16 HISTORY — PX: POSTERIOR LUMBAR FUSION 2 WITH HARDWARE REMOVAL: SHX7297

## 2024-08-16 LAB — POCT PREGNANCY, URINE: Preg Test, Ur: NEGATIVE

## 2024-08-16 SURGERY — ARTHROSCOPY, SHOULDER WITH DEBRIDEMENT
Anesthesia: General | Laterality: Right

## 2024-08-16 SURGERY — ARTHROSCOPY, SHOULDER WITH DEBRIDEMENT
Anesthesia: General | Site: Shoulder | Laterality: Right

## 2024-08-16 MED ORDER — SODIUM CHLORIDE 0.9 % IV SOLN: 12.5000 mg | INTRAVENOUS | Status: DC | PRN

## 2024-08-16 MED ORDER — DEXMEDETOMIDINE HCL IN NACL 80 MCG/20ML IV SOLN
INTRAVENOUS | Status: DC | PRN
  Administered 2024-08-16: 8 ug via INTRAVENOUS
  Administered 2024-08-16: 12 ug via INTRAVENOUS

## 2024-08-16 MED ORDER — LACTATED RINGERS IV SOLN: INTRAVENOUS | Status: DC | PRN

## 2024-08-16 MED ORDER — PROPOFOL 10 MG/ML IV BOLUS
INTRAVENOUS | Status: DC | PRN
  Administered 2024-08-16: 200 mg via INTRAVENOUS

## 2024-08-16 MED ORDER — MEPERIDINE HCL 25 MG/ML IJ SOLN: 6.2500 mg | INTRAMUSCULAR | Status: DC | PRN

## 2024-08-16 MED ORDER — VANCOMYCIN HCL 1 G IV SOLR
INTRAVENOUS | Status: DC | PRN
  Administered 2024-08-16: 1000 mg via TOPICAL

## 2024-08-16 MED ORDER — CEFAZOLIN SODIUM-DEXTROSE 2-4 GM/100ML-% IV SOLN: 2.0000 g | INTRAVENOUS | Status: DC

## 2024-08-16 MED ORDER — MIDAZOLAM HCL 2 MG/2ML IJ SOLN
INTRAMUSCULAR | Status: AC
  Filled 2024-08-16: qty 2

## 2024-08-16 MED ORDER — LIDOCAINE 2% (20 MG/ML) 5 ML SYRINGE
INTRAMUSCULAR | Status: AC
  Filled 2024-08-16: qty 5

## 2024-08-16 MED ORDER — AMISULPRIDE (ANTIEMETIC) 5 MG/2ML IV SOLN: 10.0000 mg | Freq: Once | INTRAVENOUS | Status: DC | PRN

## 2024-08-16 MED ORDER — ONDANSETRON HCL 4 MG/2ML IJ SOLN
INTRAMUSCULAR | Status: DC | PRN
  Administered 2024-08-16: 4 mg via INTRAVENOUS

## 2024-08-16 MED ORDER — DEXAMETHASONE SODIUM PHOSPHATE 10 MG/ML IJ SOLN
INTRAMUSCULAR | Status: DC | PRN
  Administered 2024-08-16: 5 mg via INTRAVENOUS

## 2024-08-16 MED ORDER — CEFAZOLIN SODIUM-DEXTROSE 2-4 GM/100ML-% IV SOLN
INTRAVENOUS | Status: AC
  Filled 2024-08-16: qty 100

## 2024-08-16 MED ORDER — DEXAMETHASONE SODIUM PHOSPHATE 10 MG/ML IJ SOLN
INTRAMUSCULAR | Status: AC
  Filled 2024-08-16: qty 1

## 2024-08-16 MED ORDER — OXYCODONE HCL 5 MG PO TABS: ORAL_TABLET | ORAL | 0 refills | Status: AC

## 2024-08-16 MED ORDER — EPINEPHRINE PF 1 MG/ML IJ SOLN
INTRAMUSCULAR | Status: AC
  Filled 2024-08-16: qty 4

## 2024-08-16 MED ORDER — FENTANYL CITRATE (PF) 100 MCG/2ML IJ SOLN: INTRAMUSCULAR | Status: DC | PRN

## 2024-08-16 MED ORDER — OXYCODONE HCL 5 MG/5ML PO SOLN: 5.0000 mg | Freq: Once | ORAL | Status: AC | PRN

## 2024-08-16 MED ORDER — SUCCINYLCHOLINE CHLORIDE 200 MG/10ML IV SOSY
PREFILLED_SYRINGE | INTRAVENOUS | Status: DC | PRN
  Administered 2024-08-16: 120 mg via INTRAVENOUS

## 2024-08-16 MED ORDER — FENTANYL CITRATE (PF) 100 MCG/2ML IJ SOLN
100.0000 ug | Freq: Once | INTRAMUSCULAR | Status: AC
  Administered 2024-08-16: 100 ug via INTRAVENOUS

## 2024-08-16 MED ORDER — GABAPENTIN 300 MG PO CAPS
300.0000 mg | ORAL_CAPSULE | Freq: Once | ORAL | Status: AC
  Administered 2024-08-16: 300 mg via ORAL

## 2024-08-16 MED ORDER — ONDANSETRON HCL 4 MG PO TABS: 4.0000 mg | ORAL_TABLET | Freq: Three times a day (TID) | ORAL | 0 refills | Status: AC | PRN

## 2024-08-16 MED ORDER — PROPOFOL 10 MG/ML IV BOLUS
INTRAVENOUS | Status: AC
  Filled 2024-08-16: qty 20

## 2024-08-16 MED ORDER — OXYCODONE HCL 5 MG PO TABS
ORAL_TABLET | ORAL | Status: AC
  Filled 2024-08-16: qty 1

## 2024-08-16 MED ORDER — ACETAMINOPHEN 500 MG PO TABS: 1000.0000 mg | ORAL_TABLET | Freq: Three times a day (TID) | ORAL | 0 refills | Status: AC

## 2024-08-16 MED ORDER — LIDOCAINE HCL (CARDIAC) PF 100 MG/5ML IV SOSY
PREFILLED_SYRINGE | INTRAVENOUS | Status: DC | PRN
  Administered 2024-08-16: 80 mg via INTRAVENOUS

## 2024-08-16 MED ORDER — BUPIVACAINE HCL (PF) 0.5 % IJ SOLN
INTRAMUSCULAR | Status: DC | PRN
  Administered 2024-08-16: 20 mL via PERINEURAL

## 2024-08-16 MED ORDER — ACETAMINOPHEN 500 MG PO TABS
1000.0000 mg | ORAL_TABLET | Freq: Once | ORAL | Status: AC
  Administered 2024-08-16: 1000 mg via ORAL

## 2024-08-16 MED ORDER — HYDROMORPHONE HCL 1 MG/ML IJ SOLN
INTRAMUSCULAR | Status: AC
  Filled 2024-08-16: qty 0.5

## 2024-08-16 MED ORDER — SUGAMMADEX SODIUM 200 MG/2ML IV SOLN
INTRAVENOUS | Status: DC | PRN
  Administered 2024-08-16: 370 mg via INTRAVENOUS

## 2024-08-16 MED ORDER — HYDROMORPHONE HCL 1 MG/ML IJ SOLN
0.2500 mg | INTRAMUSCULAR | Status: DC | PRN
  Administered 2024-08-16 (×2): 0.5 mg via INTRAVENOUS

## 2024-08-16 MED ORDER — FENTANYL CITRATE (PF) 100 MCG/2ML IJ SOLN
INTRAMUSCULAR | Status: AC
  Filled 2024-08-16: qty 2

## 2024-08-16 MED ORDER — OXYCODONE HCL 5 MG PO TABS
5.0000 mg | ORAL_TABLET | Freq: Once | ORAL | Status: AC | PRN
  Administered 2024-08-16: 5 mg via ORAL

## 2024-08-16 MED ORDER — LACTATED RINGERS IV SOLN: INTRAVENOUS | Status: DC

## 2024-08-16 MED ORDER — GABAPENTIN 100 MG PO CAPS: 100.0000 mg | ORAL_CAPSULE | Freq: Three times a day (TID) | ORAL | 0 refills | Status: AC

## 2024-08-16 MED ORDER — BUPIVACAINE LIPOSOME 1.3 % IJ SUSP
INTRAMUSCULAR | Status: DC | PRN
  Administered 2024-08-16: 10 mL via PERINEURAL

## 2024-08-16 MED ORDER — ACETAMINOPHEN 500 MG PO TABS
ORAL_TABLET | ORAL | Status: AC
  Filled 2024-08-16: qty 2

## 2024-08-16 MED ORDER — GABAPENTIN 300 MG PO CAPS
ORAL_CAPSULE | ORAL | Status: AC
  Filled 2024-08-16: qty 1

## 2024-08-16 MED ORDER — CEFAZOLIN SODIUM-DEXTROSE 2-3 GM-%(50ML) IV SOLR
INTRAVENOUS | Status: DC | PRN
  Administered 2024-08-16: 2 g via INTRAVENOUS

## 2024-08-16 MED ORDER — ROCURONIUM BROMIDE 100 MG/10ML IV SOLN
INTRAVENOUS | Status: DC | PRN
  Administered 2024-08-16: 40 mg via INTRAVENOUS

## 2024-08-16 MED ORDER — ONDANSETRON HCL 4 MG/2ML IJ SOLN
INTRAMUSCULAR | Status: AC
  Filled 2024-08-16: qty 2

## 2024-08-16 MED ORDER — SODIUM CHLORIDE 0.9 % IR SOLN
Status: DC | PRN
  Administered 2024-08-16: 200 mL

## 2024-08-16 MED ORDER — VANCOMYCIN HCL 1000 MG IV SOLR
INTRAVENOUS | Status: AC
  Filled 2024-08-16: qty 20

## 2024-08-16 MED ORDER — MIDAZOLAM HCL 2 MG/2ML IJ SOLN
2.0000 mg | Freq: Once | INTRAMUSCULAR | Status: AC
  Administered 2024-08-16: 2 mg via INTRAVENOUS

## 2024-08-16 MED ORDER — MELOXICAM 7.5 MG PO TABS: 7.5000 mg | ORAL_TABLET | Freq: Two times a day (BID) | ORAL | 0 refills | Status: AC

## 2024-08-16 SURGICAL SUPPLY — 78 items
BIT DRILL 2.75 .066 CANNLTION (DRILL) IMPLANT
BIT DRILL NON CANNULATED 4MM (DRILL) IMPLANT
BLADE AVERAGE 25X9 (BLADE) IMPLANT
BLADE EXCALIBUR 4.0X13 (MISCELLANEOUS) ×1 IMPLANT
BLADE SAG MICRO R ANGLED (BLADE) IMPLANT
BLADE SURG 10 STRL SS (BLADE) ×1 IMPLANT
BLADE SURG 15 STRL LF DISP TIS (BLADE) ×1 IMPLANT
BNDG COHESIVE 4X5 TAN STRL LF (GAUZE/BANDAGES/DRESSINGS) IMPLANT
BUR EGG 3PK/BX (BURR) IMPLANT
BURR OVAL 8 FLU 4.0X13 (MISCELLANEOUS) ×1 IMPLANT
CANNULA 5.75X71 LONG (CANNULA) IMPLANT
CANNULA PASSPORT BUTTON 10-40 (CANNULA) ×1 IMPLANT
CANNULA TWIST IN 8.25X7CM (CANNULA) IMPLANT
CHLORAPREP W/TINT 26 (MISCELLANEOUS) ×1 IMPLANT
CLSR STERI-STRIP ANTIMIC 1/2X4 (GAUZE/BANDAGES/DRESSINGS) ×1 IMPLANT
COOLER ICEMAN CLASSIC (MISCELLANEOUS) ×1 IMPLANT
DRAPE IMP U-DRAPE 54X76 (DRAPES) ×1 IMPLANT
DRAPE INCISE IOBAN 66X45 STRL (DRAPES) ×1 IMPLANT
DRAPE POUCH INSTRU U-SHP 10X18 (DRAPES) ×1 IMPLANT
DRAPE U-SHAPE 76X120 STRL (DRAPES) IMPLANT
DRESSING MEPILEX FLEX 4X4 (GAUZE/BANDAGES/DRESSINGS) IMPLANT
DRSG AQUACEL AG ADV 3.5X 6 (GAUZE/BANDAGES/DRESSINGS) IMPLANT
DW OUTFLOW CASSETTE/TUBE SET (MISCELLANEOUS) ×1 IMPLANT
ELECTRODE BLDE 4.0 EZ CLN MEGD (MISCELLANEOUS) IMPLANT
ELECTRODE REM PT RTRN 9FT ADLT (ELECTROSURGICAL) ×1 IMPLANT
FIBER TAPE 2MM (SUTURE) ×2 IMPLANT
FIBERSTICK 2 (SUTURE) ×2 IMPLANT
GAUZE PAD ABD 8X10 STRL (GAUZE/BANDAGES/DRESSINGS) ×1 IMPLANT
GAUZE SPONGE 4X4 12PLY STRL (GAUZE/BANDAGES/DRESSINGS) ×1 IMPLANT
GAUZE XEROFORM 1X8 LF (GAUZE/BANDAGES/DRESSINGS) IMPLANT
GLOVE BIO SURGEON STRL SZ 6.5 (GLOVE) ×1 IMPLANT
GLOVE BIOGEL PI IND STRL 6.5 (GLOVE) ×1 IMPLANT
GLOVE BIOGEL PI IND STRL 7.0 (GLOVE) IMPLANT
GLOVE BIOGEL PI IND STRL 8 (GLOVE) ×1 IMPLANT
GLOVE ECLIPSE 6.5 STRL STRAW (GLOVE) IMPLANT
GLOVE ECLIPSE 8.0 STRL XLNG CF (GLOVE) ×1 IMPLANT
GOWN STRL REUS W/ TWL LRG LVL3 (GOWN DISPOSABLE) ×2 IMPLANT
GOWN STRL REUS W/TWL XL LVL3 (GOWN DISPOSABLE) ×1 IMPLANT
GUIDEWIRE .062X12IN LONG (WIRE) IMPLANT
GUIDEWIRE .062X7IN LONG (WIRE) IMPLANT
KIT STABILIZATION SHOULDER (MISCELLANEOUS) ×1 IMPLANT
KIT STR SPEAR 1.8 FBRTK DISP (KITS) IMPLANT
LASSO 90 CVE QUICKPAS (DISPOSABLE) IMPLANT
LASSO CRESCENT QUICKPASS (SUTURE) IMPLANT
MANIFOLD NEPTUNE II (INSTRUMENTS) ×1 IMPLANT
NDL HD SCORPION MEGA LOADER (NEEDLE) IMPLANT
NDL SAFETY ECLIPSE 18X1.5 (NEEDLE) ×1 IMPLANT
NDL SUT 6 .5 CRC .975X.05 MAYO (NEEDLE) IMPLANT
PACK ARTHROSCOPY DSU (CUSTOM PROCEDURE TRAY) ×1 IMPLANT
PACK BASIN DAY SURGERY FS (CUSTOM PROCEDURE TRAY) ×1 IMPLANT
PAD COLD SHLDR WRAP-ON (PAD) ×1 IMPLANT
PENCIL SMOKE EVACUATOR (MISCELLANEOUS) ×1 IMPLANT
RESTRAINT HEAD UNIVERSAL NS (MISCELLANEOUS) ×1 IMPLANT
SCREW CANC 3.75X30 CANN FT (Screw) IMPLANT
SCREW CANN F/T 3.75X32MM (Screw) IMPLANT
SHEET MEDIUM DRAPE 40X70 STRL (DRAPES) ×1 IMPLANT
SLEEVE SCD COMPRESS KNEE MED (STOCKING) ×1 IMPLANT
SLING ARM FOAM STRAP LRG (SOFTGOODS) IMPLANT
SLING SHLDR PAD UNIV <17 (SOFTGOODS) ×1 IMPLANT
SPIKE FLUID TRANSFER (MISCELLANEOUS) IMPLANT
SPONGE T-LAP 18X18 ~~LOC~~+RFID (SPONGE) ×1 IMPLANT
SUT ETHILON 3 0 PS 1 (SUTURE) IMPLANT
SUT MNCRL AB 4-0 PS2 18 (SUTURE) ×1 IMPLANT
SUT PDS AB 0 CT 36 (SUTURE) IMPLANT
SUT TIGER TAPE 7 IN WHITE (SUTURE) IMPLANT
SUT VIC AB 0 CT1 27XBRD ANBCTR (SUTURE) ×1 IMPLANT
SUT VIC AB 3-0 SH 27X BRD (SUTURE) ×1 IMPLANT
SUTURE FIBERWR #2 38 T-5 BLUE (SUTURE) IMPLANT
SUTURE FIBERWR 2-0 18 17.9 3/8 (SUTURE) IMPLANT
SUTURE TAPE 1.3 FIBERLOP 20 ST (SUTURE) ×2 IMPLANT
SUTURE TAPE TIGERLINK 1.3MM BL (SUTURE) IMPLANT
SYR 5ML LL (SYRINGE) ×1 IMPLANT
TAPE FIBER 2MM 7IN #2 BLUE (SUTURE) IMPLANT
TOWEL GREEN STERILE FF (TOWEL DISPOSABLE) ×2 IMPLANT
TUBE CONNECTING 20X1/4 (TUBING) ×1 IMPLANT
TUBE SUCTION HIGH CAP CLEAR NV (SUCTIONS) IMPLANT
TUBING ARTHROSCOPY IRRIG 16FT (MISCELLANEOUS) ×1 IMPLANT
WAND ABLATOR APOLLO I90 (BUR) ×1 IMPLANT

## 2024-08-16 NOTE — Progress Notes (Signed)
 Assisted Dr. Hyacinth Meeker with right, interscalene , ultrasound guided block. Side rails up, monitors on throughout procedure. See vital signs in flow sheet. Tolerated Procedure well.

## 2024-08-16 NOTE — Interval H&P Note (Signed)
 All questions answered, patient wants to proceed with procedure. ? ?

## 2024-08-16 NOTE — Anesthesia Procedure Notes (Signed)
 Anesthesia Regional Block: Interscalene brachial plexus block   Pre-Anesthetic Checklist: , timeout performed,  Correct Patient, Correct Site, Correct Laterality,  Correct Procedure, Correct Position, site marked,  Risks and benefits discussed,  Surgical consent,  Pre-op evaluation,  At surgeon's request and post-op pain management  Laterality: Right  Prep: chloraprep       Needles:  Injection technique: Single-shot  Needle Type: Stimiplex     Needle Length: 9cm  Needle Gauge: 21     Additional Needles:   Procedures:,,,, ultrasound used (permanent image in chart),,    Narrative:  Start time: 08/16/2024 7:25 AM End time: 08/16/2024 7:30 AM Injection made incrementally with aspirations every 5 mL.  Performed by: Personally  Anesthesiologist: Cleotilde Butler Dade, MD

## 2024-08-16 NOTE — Op Note (Signed)
 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  Patient had a grossly unstable shoulder with a engaging bipolar glenoid bone loss issue.  Cartilage surfaces were otherwise intact.  At least 15 to 20% anterior bone loss was noted with no obvious labral tissue noted.  Once debridement was performed a laterally was performed with a 22 mm graft.  Great fixation was noted.  We used 2 sutures tied to our screws to perform an open capsulorrhaphy at the end of the case.  Post-operative plan: The patient will be nonweightbearing in a sling for 6 weeks with therapy to start early.  The patient will be charged home.  DVT prophylaxis Aspirin  81 mg twice daily for 6 weeks.   Pain control with PRN pain medication preferring oral medicines.  Follow up plan will be scheduled in approximately 7 days for incision check and XR.  Post-Op Diagnosis: Same Surgeons:Primary: Tanya Bonner DASEN, MD Assistants:Caroline McBane, PA-C Location: [facility] OR ROOM 6 Anesthesia: General with regional anesthesia Antibiotics: Ancef 2 g with local vancomycin powder 1 g at the surgical site Tourniquet time:  Estimated Blood Loss: Minimal Complications: None Specimens: None Implants: Implant Name Type Inv. Item Serial No. Manufacturer Lot No. LRB No. Used Action  SCREW CANN F/T [id number] - [id number] Screw SCREW CANN F/T 3.75X32MM  [employer] IN TRAY Right 1 Implanted  arthrex screw [id number]     IN TRAY Right 1 Implanted    Indications for Surgery:   Tanya Terry is a 38 y.o. 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  Benefits and  risks of operative and nonoperative management were discussed prior to surgery with patient/guardian(s) and informed consent form was completed.  Specific risks including infection, need for additional surgery, neurovascular damage, continued instability, arthrosis amongst others.   Procedure:   The patient was identified properly. Informed consent was obtained and the surgical site was marked. The patient was taken up to suite where general anesthesia was induced.  The patient was positioned beachchair on an [place] table.  The right older was prepped and draped in the usual sterile fashion.  Timeout was performed before the beginning of the case.  We began with standard diagnostic arthroscopy portals.  We identified appropriate anatomic structures.  We placed our scope and perform extensive debridement of labrum, bone glenoid and humeral, articular cartilage, rotator interval tissue amongst others.  We then switched to the open portion of the case.  We began with a standard deltopectoral incision using the central and inferior aspects of the incision about 5 to 7 cm in length.  We made this slightly medial to the coracoid.  With the skin sharply achieving hemostasis as we progressed.  We identified the deltopectoral interval and bluntly dissected it placing blunt retractors.  We identified the clavipectoral fascia and opened this sharply and were able to place self-retaining retractors in this layer.  We placed a Covell retractor just superior to the coracoid.  We carefully dissected medial and lateral to the conjoined tendon taking care not to stray deep or medial.  We were able to release the pectoralis minor off the medial border of the coracoid and placed a blunt retractor at the superior aspect of  the coracoid.  We carefully measured and identified that we had an appropriate length coracoid.  We then placed retractors medial lateral as well as posterior and used a saw at 90 degrees and an osteotome to  harvest our coracoid bone block.  Our coracoid length was 22 millimeter.  We then irrigated and wrapped this and placed deep within her incision.  At Kobal retractor was placed again exposing the subscapularis.  We identified the circumflex vessels and made a split in the midportion of the subscapularis ensuring that we did not disrupt the tendon bone interface.  I was able to elevate the capsule from the muscle belly.  We open the capsule separately.  A small capsulotomy was performed.  We then placed a medial anterior glenoid retractor within her subscapularis split and placed a Fukuda style retractor carefully into the glenohumeral joint exposing the anterior portion of the glenoid neck.  We carefully removed soft tissue from the anterior glenoid and decorticated with a bur.  We had a flat recipient side surface.  All sutures and anchors that were in the way were removed.  This was at the inferior anterior aspect of the glenoid.  We were able to visualize the 6 o'clock position.  At this point identified that the coracoid was appropriate for typical laterjet fixation with inferior surface of coracoid to be placed on glenoid.  We placed our coracoid bone graft in the Arthrex clamps and were able to drill 2 appropriate holes with a 4 mm noncannulated drill.  We flatten the deep side of the coracoid that would interface with the glenoid to appropriately make with the glenoid surface.  We irrigated at this point and placed our retractors to get visualization of the anterior-inferior glenoid.  We were able to then place our graft using our guide handle and placed 2 cannulated guidewires.  At that point were able to drill for our 3.75 Arthrex cannulated screws.  We placed both by hand and had great compression and purchase.  We then felt that it was appropriate for repair of the capsule.  We performed an open capsulorrhaphy 2 FiberWire sutures that we had connected to our screws that had been  placed.  We closed the capsule with side-to-side FiberWire incorporating the CA ligament.  We then closed our subscapularis split with simple FiberWire sutures.  We irrigated copiously placed local vancomycin powder and closed incision multilayer fashion with absorbable suture.  Sterile dressing and splint were placed.  Patient was woken taken back in stable condition.  Aleck Stalling, PA-C, present and scrubbed throughout the case, critical for completion in a timely fashion, and for retraction, instrumentation, closure.

## 2024-08-16 NOTE — Anesthesia Postprocedure Evaluation (Signed)
 Anesthesia Post Note  Patient: Tanya Terry  Procedure(s) Performed: ARTHROSCOPY, SHOULDER WITH DEBRIDEMENT (Right: Shoulder) OPEN LATARJET, CORACOID PROCESS TRANSFER, SHOULDER (Right: Shoulder)     Patient location during evaluation: PACU Anesthesia Type: General Level of consciousness: awake and alert Pain management: pain level controlled Vital Signs Assessment: post-procedure vital signs reviewed and stable Respiratory status: spontaneous breathing, nonlabored ventilation and respiratory function stable Cardiovascular status: blood pressure returned to baseline and stable Postop Assessment: no apparent nausea or vomiting Anesthetic complications: no   No notable events documented.  Last Vitals:  Vitals:   08/16/24 1038 08/16/24 1110  BP: 118/73 123/82  Pulse: (!) 57 60  Resp: 18 18  Temp:  (!) 36.3 C  SpO2: 93% 96%    Last Pain:  Vitals:   08/16/24 1110  TempSrc: Temporal  PainSc: 5                  Butler Levander Pinal

## 2024-08-16 NOTE — Transfer of Care (Signed)
 Immediate Anesthesia Transfer of Care Note  Patient: Tanya Terry  Procedure(s) Performed: ARTHROSCOPY, SHOULDER WITH DEBRIDEMENT (Right: Shoulder) OPEN LATARJET, CORACOID PROCESS TRANSFER, SHOULDER (Right: Shoulder)  Patient Location: PACU  Anesthesia Type:General and Regional  Level of Consciousness: awake, alert , and patient cooperative  Airway & Oxygen Therapy: Patient Spontanous Breathing and Patient connected to face mask oxygen  Post-op Assessment: Report given to RN and Post -op Vital signs reviewed and stable  Post vital signs: Reviewed and stable  Last Vitals:  Vitals Value Taken Time  BP 132/74 08/16/24 09:32  Temp    Pulse 69 08/16/24 09:38  Resp 10 08/16/24 09:38  SpO2 98 % 08/16/24 09:38  Vitals shown include unfiled device data.  Last Pain:  Vitals:   08/16/24 0643  TempSrc: Temporal  PainSc: 7          Complications: No notable events documented.

## 2024-08-16 NOTE — Anesthesia Procedure Notes (Signed)
 Procedure Name: Intubation Date/Time: 08/16/2024 8:16 AM  Performed by: Burnard Rosaline HERO, CRNAPre-anesthesia Checklist: Patient identified, Emergency Drugs available, Suction available and Patient being monitored Patient Re-evaluated:Patient Re-evaluated prior to induction Oxygen Delivery Method: Circle system utilized Preoxygenation: Pre-oxygenation with 100% oxygen Induction Type: IV induction Ventilation: Mask ventilation without difficulty Laryngoscope Size: Mac and 4 Grade View: Grade I Tube type: Oral Tube size: 7.5 mm Number of attempts: 1 Airway Equipment and Method: Stylet and Oral airway Placement Confirmation: ETT inserted through vocal cords under direct vision, positive ETCO2, breath sounds checked- equal and bilateral and CO2 detector Secured at: 24 cm Tube secured with: Tape Dental Injury: Teeth and Oropharynx as per pre-operative assessment

## 2024-08-21 ENCOUNTER — Encounter (HOSPITAL_BASED_OUTPATIENT_CLINIC_OR_DEPARTMENT_OTHER): Payer: Self-pay | Admitting: Orthopaedic Surgery

## 2024-08-23 ENCOUNTER — Ambulatory Visit: Admitting: Physical Therapy

## 2024-08-24 ENCOUNTER — Ambulatory Visit

## 2024-08-30 ENCOUNTER — Ambulatory Visit

## 2024-08-31 ENCOUNTER — Ambulatory Visit

## 2024-09-04 ENCOUNTER — Ambulatory Visit

## 2024-09-06 ENCOUNTER — Ambulatory Visit

## 2024-09-11 ENCOUNTER — Ambulatory Visit

## 2024-09-13 ENCOUNTER — Ambulatory Visit

## 2024-09-18 ENCOUNTER — Ambulatory Visit

## 2024-11-13 ENCOUNTER — Encounter (HOSPITAL_BASED_OUTPATIENT_CLINIC_OR_DEPARTMENT_OTHER): Payer: Self-pay | Admitting: Orthopaedic Surgery
